# Patient Record
Sex: Female | Born: 1960 | Race: Black or African American | Hispanic: No | Marital: Single | Smoking: Never smoker
Health system: Southern US, Community
[De-identification: ages and names within clinical notes are randomized; demographics above are authoritative.]

## PROBLEM LIST (undated history)

## (undated) DIAGNOSIS — I10 Essential (primary) hypertension: Secondary | ICD-10-CM

## (undated) DIAGNOSIS — I2699 Other pulmonary embolism without acute cor pulmonale: Secondary | ICD-10-CM

## (undated) DIAGNOSIS — Z923 Personal history of irradiation: Secondary | ICD-10-CM

## (undated) DIAGNOSIS — C539 Malignant neoplasm of cervix uteri, unspecified: Secondary | ICD-10-CM

## (undated) DIAGNOSIS — I82409 Acute embolism and thrombosis of unspecified deep veins of unspecified lower extremity: Secondary | ICD-10-CM

## (undated) DIAGNOSIS — H548 Legal blindness, as defined in USA: Secondary | ICD-10-CM

## (undated) DIAGNOSIS — R569 Unspecified convulsions: Secondary | ICD-10-CM

## (undated) HISTORY — DX: Acute embolism and thrombosis of unspecified deep veins of unspecified lower extremity: I82.409

## (undated) HISTORY — DX: Other pulmonary embolism without acute cor pulmonale: I26.99

## (undated) HISTORY — DX: Personal history of irradiation: Z92.3

## (undated) HISTORY — PX: OTHER SURGICAL HISTORY: SHX169

## (undated) HISTORY — DX: Malignant neoplasm of cervix uteri, unspecified: C53.9

## (undated) HISTORY — DX: Legal blindness, as defined in USA: H54.8

---

## 2005-07-02 ENCOUNTER — Other Ambulatory Visit: Admission: RE | Admit: 2005-07-02 | Discharge: 2005-07-02 | Payer: Self-pay | Admitting: Family Medicine

## 2005-07-02 ENCOUNTER — Other Ambulatory Visit: Admission: RE | Admit: 2005-07-02 | Discharge: 2005-07-02 | Payer: Self-pay | Admitting: Unknown Physician Specialty

## 2018-08-07 ENCOUNTER — Other Ambulatory Visit: Payer: Self-pay | Admitting: Physician Assistant

## 2018-08-07 ENCOUNTER — Other Ambulatory Visit (HOSPITAL_COMMUNITY): Payer: Self-pay | Admitting: Physician Assistant

## 2018-08-07 DIAGNOSIS — R600 Localized edema: Secondary | ICD-10-CM

## 2018-08-11 ENCOUNTER — Other Ambulatory Visit: Payer: Self-pay

## 2018-08-11 ENCOUNTER — Ambulatory Visit (HOSPITAL_COMMUNITY)
Admission: RE | Admit: 2018-08-11 | Discharge: 2018-08-11 | Disposition: A | Discharge status: Home / Self Care | Payer: Commercial Managed Care - PPO | Source: Ambulatory Visit | Attending: Physician Assistant | Admitting: Physician Assistant

## 2018-08-11 ENCOUNTER — Emergency Department (HOSPITAL_COMMUNITY): Payer: Commercial Managed Care - PPO

## 2018-08-11 ENCOUNTER — Encounter (HOSPITAL_COMMUNITY): Payer: Self-pay

## 2018-08-11 ENCOUNTER — Observation Stay (HOSPITAL_COMMUNITY)
Admission: EM | Admit: 2018-08-11 | Discharge: 2018-08-12 | Disposition: A | Discharge status: Home / Self Care | Payer: Commercial Managed Care - PPO | Attending: Internal Medicine | Admitting: Internal Medicine

## 2018-08-11 DIAGNOSIS — I82401 Acute embolism and thrombosis of unspecified deep veins of right lower extremity: Secondary | ICD-10-CM | POA: Diagnosis present

## 2018-08-11 DIAGNOSIS — R600 Localized edema: Secondary | ICD-10-CM

## 2018-08-11 DIAGNOSIS — I2699 Other pulmonary embolism without acute cor pulmonale: Secondary | ICD-10-CM | POA: Insufficient documentation

## 2018-08-11 DIAGNOSIS — I824Y1 Acute embolism and thrombosis of unspecified deep veins of right proximal lower extremity: Secondary | ICD-10-CM | POA: Diagnosis not present

## 2018-08-11 DIAGNOSIS — I10 Essential (primary) hypertension: Secondary | ICD-10-CM | POA: Diagnosis not present

## 2018-08-11 DIAGNOSIS — E876 Hypokalemia: Secondary | ICD-10-CM | POA: Diagnosis present

## 2018-08-11 DIAGNOSIS — R2241 Localized swelling, mass and lump, right lower limb: Secondary | ICD-10-CM | POA: Diagnosis present

## 2018-08-11 DIAGNOSIS — Z79899 Other long term (current) drug therapy: Secondary | ICD-10-CM | POA: Diagnosis not present

## 2018-08-11 DIAGNOSIS — I824Z1 Acute embolism and thrombosis of unspecified deep veins of right distal lower extremity: Secondary | ICD-10-CM

## 2018-08-11 DIAGNOSIS — Z86711 Personal history of pulmonary embolism: Secondary | ICD-10-CM | POA: Diagnosis present

## 2018-08-11 DIAGNOSIS — I4891 Unspecified atrial fibrillation: Secondary | ICD-10-CM | POA: Diagnosis present

## 2018-08-11 DIAGNOSIS — G40909 Epilepsy, unspecified, not intractable, without status epilepticus: Secondary | ICD-10-CM

## 2018-08-11 DIAGNOSIS — I480 Paroxysmal atrial fibrillation: Secondary | ICD-10-CM | POA: Diagnosis present

## 2018-08-11 HISTORY — DX: Unspecified convulsions: R56.9

## 2018-08-11 HISTORY — DX: Essential (primary) hypertension: I10

## 2018-08-11 LAB — CBC WITH DIFFERENTIAL/PLATELET
Abs Immature Granulocytes: 0.03 10*3/uL (ref 0.00–0.07)
Basophils Absolute: 0.1 10*3/uL (ref 0.0–0.1)
Basophils Relative: 1 %
Eosinophils Absolute: 1.2 10*3/uL — ABNORMAL HIGH (ref 0.0–0.5)
Eosinophils Relative: 12 %
HCT: 33.2 % — ABNORMAL LOW (ref 36.0–46.0)
Hemoglobin: 10.5 g/dL — ABNORMAL LOW (ref 12.0–15.0)
Immature Granulocytes: 0 %
Lymphocytes Relative: 26 %
Lymphs Abs: 2.6 10*3/uL (ref 0.7–4.0)
MCH: 30.4 pg (ref 26.0–34.0)
MCHC: 31.6 g/dL (ref 30.0–36.0)
MCV: 96.2 fL (ref 80.0–100.0)
Monocytes Absolute: 0.7 10*3/uL (ref 0.1–1.0)
Monocytes Relative: 7 %
Neutro Abs: 5.5 10*3/uL (ref 1.7–7.7)
Neutrophils Relative %: 54 %
Platelets: 340 10*3/uL (ref 150–400)
RBC: 3.45 MIL/uL — ABNORMAL LOW (ref 3.87–5.11)
RDW: 14.1 % (ref 11.5–15.5)
WBC: 10.2 10*3/uL (ref 4.0–10.5)
nRBC: 0 % (ref 0.0–0.2)

## 2018-08-11 LAB — COMPREHENSIVE METABOLIC PANEL
ALT: 14 U/L (ref 0–44)
AST: 19 U/L (ref 15–41)
Albumin: 3.6 g/dL (ref 3.5–5.0)
Alkaline Phosphatase: 98 U/L (ref 38–126)
Anion gap: 10 (ref 5–15)
BUN: 15 mg/dL (ref 6–20)
CO2: 23 mmol/L (ref 22–32)
Calcium: 9.1 mg/dL (ref 8.9–10.3)
Chloride: 105 mmol/L (ref 98–111)
Creatinine, Ser: 0.97 mg/dL (ref 0.44–1.00)
GFR calc Af Amer: >60 mL/min (ref >60)
GFR calc non Af Amer: >60 mL/min (ref >60)
Glucose, Bld: 109 mg/dL — ABNORMAL HIGH (ref 70–99)
Potassium: 3.4 mmol/L — ABNORMAL LOW (ref 3.5–5.1)
Sodium: 138 mmol/L (ref 135–145)
Total Bilirubin: 0.5 mg/dL (ref 0.3–1.2)
Total Protein: 8.1 g/dL (ref 6.5–8.1)

## 2018-08-11 LAB — HEPARIN LEVEL (UNFRACTIONATED): Heparin Unfractionated: 0.53 IU/mL (ref 0.30–0.70)

## 2018-08-11 LAB — MAGNESIUM: Magnesium: 1.9 mg/dL (ref 1.7–2.4)

## 2018-08-11 LAB — PROTIME-INR
INR: 1 (ref 0.8–1.2)
Prothrombin Time: 13.4 seconds (ref 11.4–15.2)

## 2018-08-11 LAB — APTT: aPTT: 29 seconds (ref 24–36)

## 2018-08-11 MED ORDER — IOHEXOL 350 MG/ML SOLN
100.0000 mL | Freq: Once | INTRAVENOUS | Status: AC | PRN
  Administered 2018-08-11: 100 mL via INTRAVENOUS

## 2018-08-11 MED ORDER — ACETAMINOPHEN 650 MG RE SUPP: 650.0000 mg | Freq: Four times a day (QID) | RECTAL | Status: DC | PRN

## 2018-08-11 MED ORDER — ACETAMINOPHEN 325 MG PO TABS: 650.0000 mg | ORAL_TABLET | Freq: Four times a day (QID) | ORAL | Status: DC | PRN

## 2018-08-11 MED ORDER — HEPARIN SODIUM (PORCINE) 5000 UNIT/ML IJ SOLN
4000.0000 [IU] | Freq: Once | INTRAMUSCULAR | Status: AC
  Administered 2018-08-11: 4000 [IU] via INTRAVENOUS

## 2018-08-11 MED ORDER — POTASSIUM CHLORIDE CRYS ER 20 MEQ PO TBCR
40.0000 meq | EXTENDED_RELEASE_TABLET | Freq: Once | ORAL | Status: AC
  Administered 2018-08-11: 40 meq via ORAL
  Filled 2018-08-11: qty 2

## 2018-08-11 MED ORDER — HEPARIN (PORCINE) 25000 UT/250ML-% IV SOLN
1350.0000 [IU]/h | INTRAVENOUS | Status: DC
  Administered 2018-08-11 – 2018-08-12 (×2): 1350 [IU]/h via INTRAVENOUS
  Filled 2018-08-11 (×2): qty 250

## 2018-08-11 MED ORDER — PHENOBARBITAL 97.2 MG PO TABS
97.2000 mg | ORAL_TABLET | Freq: Two times a day (BID) | ORAL | Status: DC
  Administered 2018-08-11 – 2018-08-12 (×2): 97.2 mg via ORAL
  Filled 2018-08-11 (×3): qty 1

## 2018-08-11 MED ORDER — ATENOLOL 25 MG PO TABS
50.0000 mg | ORAL_TABLET | Freq: Every day | ORAL | Status: DC
  Administered 2018-08-11: 50 mg via ORAL
  Filled 2018-08-11 (×2): qty 2

## 2018-08-11 MED ORDER — CARBAMAZEPINE 200 MG PO TABS
400.0000 mg | ORAL_TABLET | Freq: Two times a day (BID) | ORAL | Status: DC
  Administered 2018-08-11 – 2018-08-12 (×2): 400 mg via ORAL
  Filled 2018-08-11 (×3): qty 2

## 2018-08-11 MED ORDER — ATENOLOL 25 MG PO TABS: 50.0000 mg | ORAL_TABLET | Freq: Every day | ORAL | Status: DC

## 2018-08-11 NOTE — ED Triage Notes (Addendum)
Pt sent over from [place] due to abnormal result. Pt has swelling to right leg which has been present for a year. Denies pain. Pt reports SOB with exertion for 6 months

## 2018-08-11 NOTE — Progress Notes (Signed)
RN paged Debbie Blinks, NP to make her aware that patient's BP was 158/106 and patient is due to start taking Atenolol 50 mg in a.m., but patient states she takes Atenolol at night.  NP ordered Atenolol to begin this evening.  RN will continue to monitor patient and make NP aware of any changes as needed.  [name], RN
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
## 2018-08-11 NOTE — ED Notes (Signed)
Have notified Dr. Velia Meyer of patient's EKG.

## 2018-08-11 NOTE — Progress Notes (Signed)
ANTICOAGULATION CONSULT NOTE - Initial Consult  Pharmacy Consult for heparin gtt  Indication: pulmonary embolus  No Known Allergies  Patient Measurements: Height: 5\' 2"  (157.5 cm) Weight: 295 lb (133.8 kg) IBW/kg (Calculated) : 50.1 Heparin Dosing Weight: HEPARIN DW (KG): 84   Vital Signs: Temp: 97.6 F (36.4 C) (04/28 1151) Temp Source: Oral (04/28 1151) BP: 169/87 (04/28 1400) Pulse Rate: 80 (04/28 1400)  Labs: Recent Labs    08/11/18 1158  HGB 10.5*  HCT 33.2*  PLT 340  APTT 29  LABPROT 13.4  INR 1.0  CREATININE 0.97    Estimated Creatinine Clearance: 82.4 mL/min (by C-G formula based on SCr of 0.97 mg/dL).   Medical History: Past Medical History:  Diagnosis Date  . Hypertension   . Seizures ([place])     Medications:  (Not in a hospital admission)  Scheduled:  . heparin  4,000 Units Intravenous Once   Infusions:  . heparin     PRN:  Anti-infectives (From admission, onward)   None      Assessment: Debbie [name] a 58 y.o. female requires anticoagulation with a heparin iv infusion for the indication of  pulmonary embolus. Heparin gtt will be started following pharmacy protocol per pharmacy consult. Patient is not on previous oral anticoagulant that will require aPTT/HL correlation before transitioning to only HL monitoring.   Goal of Therapy:  Heparin level 0.3-0.7 units/ml Monitor platelets by anticoagulation protocol: Yes   Plan:  Give 4000 units bolus x 1 Start heparin infusion at 1350 units/hr Check anti-Xa level in 6 hours and daily while on heparin Continue to monitor H&H and platelets  Heparin level to be drawn in 6 hours  Debbie [name] 08/11/2018,3:44 PM

## 2018-08-11 NOTE — ED Notes (Signed)
Have notified daughter of patient's status with patient's permission

## 2018-08-11 NOTE — ED Notes (Addendum)
CT stated they would pick pt up in 30 mins

## 2018-08-11 NOTE — ED Notes (Signed)
[place]- Daughter

## 2018-08-11 NOTE — ED Provider Notes (Addendum)
[facility] EMERGENCY DEPARTMENT Provider Note   CSN: [id number] Arrival date & time: 08/11/18  1138    History   Chief Complaint Chief Complaint  Patient presents with   Leg Swelling    HPI Debbie [name] is a 58 y.o. female.     Right lower extremity swelling for 1 year, worse over the past few weeks.  Patient reports dyspnea on exertion for 6 months.  No substernal chest.  Past medical history includes hypertension, seizures, obesity.  She is taking her medication.  No prolonged travel or immobilization.  An outpatient ultrasound of her right lower extremity was ordered today.  It is positive for a DVT.     Past Medical History:  Diagnosis Date   Hypertension    Seizures ([place])     There are no active problems to display for this patient.   History reviewed. No pertinent surgical history.   OB History   No obstetric history on file.      Home Medications    Prior to Admission medications   Medication Sig Start Date End Date Taking? Authorizing Provider  atenolol (TENORMIN) 50 MG tablet Take 50 mg by mouth daily.   Yes [provider]  carbamazepine (TEGRETOL) 200 MG tablet Take 400 mg by mouth 2 (two) times daily.   Yes [provider]  PHENobarbital (LUMINAL) 97.2 MG tablet Take 97.2 mg by mouth 2 (two) times daily.   Yes [provider]    Family History No family history on file.  Social History Social History   Tobacco Use   Smoking status: Never Smoker  Substance Use Topics   Alcohol use: Never    Frequency: Never   Drug use: Never     Allergies   Patient has no known allergies.   Review of Systems Review of Systems  All other systems reviewed and are negative.    Physical Exam Updated Vital Signs BP (!) 169/87    Pulse 80    Temp 97.6 F (36.4 C) (Oral)    Resp 15    Ht 5\' 2"  (1.575 m)    Wt 133.8 kg    SpO2 96%    BMI 53.96 kg/m   Physical Exam Vitals signs and nursing note reviewed.    Constitutional:      Appearance: She is well-developed.  HENT:     Head: Normocephalic and atraumatic.  Eyes:     Conjunctiva/sclera: Conjunctivae normal.  Neck:     Musculoskeletal: Neck supple.  Cardiovascular:     Rate and Rhythm: Normal rate and regular rhythm.  Pulmonary:     Effort: Pulmonary effort is normal.     Breath sounds: Normal breath sounds.  Abdominal:     General: Bowel sounds are normal.     Palpations: Abdomen is soft.  Musculoskeletal:     Comments: Bilateral lower extremities: Significant soft tissue swelling bilaterally right greater than left  Skin:    General: Skin is warm and dry.  Neurological:     Mental Status: She is alert and oriented to person, place, and time.  Psychiatric:        Behavior: Behavior normal.      ED Treatments / Results  Labs (all labs ordered are listed, but only abnormal results are displayed) Labs Reviewed  CBC WITH DIFFERENTIAL/PLATELET - Abnormal; Notable for the following components:      Result Value   RBC 3.45 (*)    Hemoglobin 10.5 (*)    HCT  33.2 (*)    Eosinophils Absolute 1.2 (*)    All other components within normal limits  COMPREHENSIVE METABOLIC PANEL - Abnormal; Notable for the following components:   Potassium 3.4 (*)    Glucose, Bld 109 (*)    All other components within normal limits  APTT  PROTIME-INR    EKG None  Radiology Ct Angio Chest Pe W And/or Wo Contrast  Result Date: 08/11/2018 CLINICAL DATA:  Shortness of breath EXAM: CT ANGIOGRAPHY CHEST WITH CONTRAST TECHNIQUE: Multidetector CT imaging of the chest was performed using the standard protocol during bolus administration of intravenous contrast. Multiplanar CT image reconstructions and MIPs were obtained to evaluate the vascular anatomy. CONTRAST:  130mL OMNIPAQUE IOHEXOL 350 MG/ML SOLN COMPARISON:  Chest radiograph May 09, 2018 FINDINGS: Cardiovascular: There is extensive pulmonary embolus arising from the right main pulmonary  artery extending into the proximal lower lobe pulmonary arteries. More distally, there is no significant embolus on the right. There is no appreciable pulmonary embolus on the left. The right ventricle to left ventricle diameter ratio is 0.9, borderline for right heart strain. There is no thoracic aortic aneurysm or dissection. Visualized great vessels appear unremarkable. There is aortic atherosclerosis. There is no pericardial effusion or pericardial thickening. Mediastinum/Nodes: Thyroid appears unremarkable. There are several subcentimeter lymph nodes in the mediastinum. There is an aortopulmonary window lymph node on the left with a maximum transverse diameter of 1.3 x 1.0 cm. There is a subcarinal lymph node measuring 1.1 x 1.1 cm. No esophageal lesions are evident. Lungs/Pleura: There is a moderate right pleural effusion with both free-flowing and loculated components. There is consolidation in a portion of the right lower lobe. There is atelectatic change in each lung base region. Upper Abdomen: Gallbladder is incompletely visualized but appears somewhat distended. Visualized upper abdominal structures otherwise appear normal except for aortic atherosclerosis in the upper abdominal region. Musculoskeletal: There is degenerative change in the lower thoracic spine. There are no blastic or lytic bone lesions. There are no demonstrable chest wall lesions. Review of the MIP images confirms the above findings. IMPRESSION: 1. Pulmonary embolus arising from the right main pulmonary outflow tract and extending to the proximal right lower lobe pulmonary arteries. No pulmonary embolus seen on the left. Borderline right heart strain. Positive for acute PE with CT evidence of borderline right heart strain (RV/LV Ratio = 0.9) consistent with at least submassive (intermediate risk) PE. The presence of right heart strain has been associated with an increased risk of morbidity and mortality. Please activate Code PE by paging  [phone]. 2. Sizable right pleural effusion with both loculated and free flowing components. Right lower lobe consolidation. Areas of atelectatic change bilaterally. 3. Mildly prominent aortopulmonary window and subcarinal lymph nodes of uncertain etiology. 4. Gallbladder appears mildly distended but incompletely visualized. 5. No thoracic aortic aneurysm or dissection. There is aortic atherosclerosis. Aortic Atherosclerosis (ICD10-I70.0). Critical Value/emergent results were called by telephone at the time of interpretation on 08/11/2018 at 3:11 pm to Dr. Nat Christen , who verbally acknowledged these results. Electronically Signed   By: Lowella Grip III M.D.   On: 08/11/2018 15:12   US Venous Img Lower Unilateral Right  Result Date: 08/11/2018 CLINICAL DATA:  58 year old female with right lower extremity edema EXAM: RIGHT LOWER EXTREMITY VENOUS DOPPLER ULTRASOUND TECHNIQUE: Gray-scale sonography with graded compression, as well as color Doppler and duplex ultrasound were performed to evaluate the lower extremity deep venous systems from the level of the common femoral vein and including the  common femoral, femoral, profunda femoral, popliteal and calf veins including the posterior tibial, peroneal and gastrocnemius veins when visible. The superficial great saphenous vein was also interrogated. Spectral Doppler was utilized to evaluate flow at rest and with distal augmentation maneuvers in the common femoral, femoral and popliteal veins. COMPARISON:  None. FINDINGS: Contralateral Common Femoral Vein: Respiratory phasicity is normal and symmetric with the symptomatic side. No evidence of thrombus. Normal compressibility. Common Femoral Vein: Acute partially occlusive thrombus extending above the common femoral artery into the pelvis. Saphenofemoral Junction: No evidence of thrombus. Normal compressibility and flow on color Doppler imaging. Profunda Femoral Vein: Acute occlusive thrombus A femoral and  popliteal vein: Acute occlusive thrombus through the length of the right femoral vein through the popliteal vein and into the tibial veins. Calf Veins: Anterior tibial vein remains patent. Partially occlusive thrombus of peroneal vein and posterior tibial vein. Superficial Great Saphenous Vein: No evidence of thrombus. Normal compressibility and flow on color Doppler imaging. Other Findings:  None. IMPRESSION: Acute right-sided DVT, including proximal DVT of the common femoral vein extending into the pelvis. Distally thrombus extends through the popliteal vein into the tibial veins. These results were discussed by telephone at the time of interpretation on 08/11/2018 at 11:50 am to Dr. Lacinda Axon, caring for the patient in the ED. Electronically Signed   By: Corrie Mckusick D.O.   On: 08/11/2018 11:53    Procedures Procedures (including critical care time)  Medications Ordered in ED Medications  heparin injection 4,000 Units (has no administration in time range)  iohexol (OMNIPAQUE) 350 MG/ML injection 100 mL (100 mLs Intravenous Contrast Given 08/11/18 1431)     Initial Impression / Assessment and Plan / ED Course  I have reviewed the triage vital signs and the nursing notes.  Pertinent labs & imaging results that were available during my care of the patient were reviewed by me and considered in my medical decision making (see chart for details).        DVT noted in right lower extremity as per ultrasound report.  Will obtain CT angiogram of chest to rule out pulmonary embolism.   1500:  CT chest angiogram shows right sided PE.  Will Rx IV Heparin.  Admit to gen med.  CRITICAL CARE Performed by: Nat Christen Total critical care time: 30 minutes Critical care time was exclusive of separately billable procedures and treating other patients. Critical care was necessary to treat or prevent imminent or life-threatening deterioration. Critical care was time spent personally by me on the following  activities: development of treatment plan with patient and/or surrogate as well as nursing, discussions with consultants, evaluation of patient's response to treatment, examination of patient, obtaining history from patient or surrogate, ordering and performing treatments and interventions, ordering and review of laboratory studies, ordering and review of radiographic studies, pulse oximetry and re-evaluation of patient's condition.  Final Clinical Impressions(s) / ED Diagnoses   Final diagnoses:  Acute deep vein thrombosis (DVT) of proximal vein of right lower extremity (HCC)  Acute pulmonary embolism, unspecified pulmonary embolism type, unspecified whether acute cor pulmonale present [facility])    ED Discharge Orders    None       Nat Christen, MD 08/11/18 1434    Nat Christen, MD 08/11/18 1530

## 2018-08-12 ENCOUNTER — Encounter (HOSPITAL_COMMUNITY): Payer: Self-pay | Admitting: General Practice

## 2018-08-12 ENCOUNTER — Observation Stay (HOSPITAL_BASED_OUTPATIENT_CLINIC_OR_DEPARTMENT_OTHER): Payer: Commercial Managed Care - PPO

## 2018-08-12 DIAGNOSIS — I824Z1 Acute embolism and thrombosis of unspecified deep veins of right distal lower extremity: Secondary | ICD-10-CM | POA: Diagnosis not present

## 2018-08-12 DIAGNOSIS — I2699 Other pulmonary embolism without acute cor pulmonale: Secondary | ICD-10-CM

## 2018-08-12 DIAGNOSIS — I10 Essential (primary) hypertension: Secondary | ICD-10-CM | POA: Diagnosis not present

## 2018-08-12 DIAGNOSIS — E876 Hypokalemia: Secondary | ICD-10-CM | POA: Diagnosis not present

## 2018-08-12 LAB — BASIC METABOLIC PANEL
Anion gap: 9 (ref 5–15)
BUN: 13 mg/dL (ref 6–20)
CO2: 26 mmol/L (ref 22–32)
Calcium: 9 mg/dL (ref 8.9–10.3)
Chloride: 105 mmol/L (ref 98–111)
Creatinine, Ser: 0.85 mg/dL (ref 0.44–1.00)
GFR calc Af Amer: >60 mL/min (ref >60)
GFR calc non Af Amer: >60 mL/min (ref >60)
Glucose, Bld: 102 mg/dL — ABNORMAL HIGH (ref 70–99)
Potassium: 3.6 mmol/L (ref 3.5–5.1)
Sodium: 140 mmol/L (ref 135–145)

## 2018-08-12 LAB — CBC
HCT: 29.7 % — ABNORMAL LOW (ref 36.0–46.0)
Hemoglobin: 9.5 g/dL — ABNORMAL LOW (ref 12.0–15.0)
MCH: 30.6 pg (ref 26.0–34.0)
MCHC: 32 g/dL (ref 30.0–36.0)
MCV: 95.8 fL (ref 80.0–100.0)
Platelets: 305 10*3/uL (ref 150–400)
RBC: 3.1 MIL/uL — ABNORMAL LOW (ref 3.87–5.11)
RDW: 13.9 % (ref 11.5–15.5)
WBC: 9.1 10*3/uL (ref 4.0–10.5)
nRBC: 0 % (ref 0.0–0.2)

## 2018-08-12 LAB — ECHOCARDIOGRAM COMPLETE
Height: 62 in
Weight: 4733.72 oz

## 2018-08-12 LAB — MAGNESIUM: Magnesium: 1.7 mg/dL (ref 1.7–2.4)

## 2018-08-12 LAB — HEPARIN LEVEL (UNFRACTIONATED): Heparin Unfractionated: 0.32 IU/mL (ref 0.30–0.70)

## 2018-08-12 MED ORDER — ENOXAPARIN SODIUM 150 MG/ML ~~LOC~~ SOLN: 130.0000 mg | Freq: Two times a day (BID) | SUBCUTANEOUS | 0 refills | Status: DC

## 2018-08-12 MED ORDER — ENOXAPARIN SODIUM 150 MG/ML ~~LOC~~ SOLN
130.0000 mg | Freq: Two times a day (BID) | SUBCUTANEOUS | Status: DC
  Administered 2018-08-12: 130 mg via SUBCUTANEOUS
  Filled 2018-08-12: qty 1

## 2018-08-12 MED ORDER — PERFLUTREN LIPID MICROSPHERE
1.0000 mL | INTRAVENOUS | Status: DC | PRN
  Filled 2018-08-12: qty 10

## 2018-08-12 MED ORDER — APIXABAN 5 MG PO TABS: ORAL_TABLET | ORAL | 5 refills | Status: DC

## 2018-08-12 MED ORDER — APIXABAN 5 MG PO TABS: 5.0000 mg | ORAL_TABLET | Freq: Two times a day (BID) | ORAL | Status: DC

## 2018-08-12 NOTE — Progress Notes (Signed)
ANTICOAGULATION CONSULT NOTE - Initial Consult  Pharmacy Consult for heparin gtt  Indication: pulmonary embolus  No Known Allergies  Patient Measurements: Height: 5\' 2"  (157.5 cm) Weight: 295 lb 13.7 oz (134.2 kg) IBW/kg (Calculated) : 50.1 Heparin Dosing Weight: HEPARIN DW (KG): 84   Vital Signs: Temp: 98.3 F (36.8 C) (04/29 0515) Temp Source: Oral (04/29 0515) BP: 138/81 (04/29 0515) Pulse Rate: 70 (04/29 0515)  Labs: Recent Labs    08/11/18 1158 08/11/18 2123 08/12/18 0552  HGB 10.5*  --  9.5*  HCT 33.2*  --  29.7*  PLT 340  --  305  APTT 29  --   --   LABPROT 13.4  --   --   INR 1.0  --   --   HEPARINUNFRC  --  0.53 0.32  CREATININE 0.97  --  0.85    Estimated Creatinine Clearance: 94.2 mL/min (by C-G formula based on SCr of 0.85 mg/dL).   Medical History: Past Medical History:  Diagnosis Date  . Hypertension   . Seizures ([place])     Medications:  Medications Prior to Admission  Medication Sig Dispense Refill Last Dose  . atenolol (TENORMIN) 50 MG tablet Take 50 mg by mouth daily.   08/10/2018 at Unknown time  . carbamazepine (TEGRETOL) 200 MG tablet Take 400 mg by mouth 2 (two) times daily.   08/10/2018 at Unknown time  . PHENobarbital (LUMINAL) 97.2 MG tablet Take 97.2 mg by mouth 2 (two) times daily.   08/10/2018 at Unknown time   Scheduled:  . atenolol  50 mg Oral Daily  . carbamazepine  400 mg Oral BID  . PHENobarbital  97.2 mg Oral BID   Infusions:  . heparin 1,350 Units/hr (08/12/18 0523)   PRN:  Anti-infectives (From admission, onward)   None      Assessment: Debbie [name] a 58 y.o. female requires anticoagulation with a heparin iv infusion for the indication of  pulmonary embolus. Heparin gtt will be started following pharmacy protocol per pharmacy consult.  HL 0.32  Goal of Therapy:  Heparin level 0.3-0.7 units/ml Monitor platelets by anticoagulation protocol: Yes   Plan:  Continue heparin infusion at 1350 units/hr Check  anti-Xa level daily while on heparin Continue to monitor H&H and platelets   Debbie [name] 08/12/2018,7:53 AM

## 2018-08-12 NOTE — Progress Notes (Signed)
*  PRELIMINARY RESULTS* Echocardiogram 2D Echocardiogram has been performed.  Samuel Germany 08/12/2018, 9:44 AM

## 2018-08-12 NOTE — Discharge Instructions (Signed)
YOUR CARDIOLOGY TEAM HAS ARRANGED FOR AN E-VISIT FOR YOUR APPOINTMENT - PLEASE REVIEW IMPORTANT INFORMATION BELOW SEVERAL DAYS PRIOR TO YOUR APPOINTMENT ° °Due to the recent COVID-19 pandemic, we are transitioning in-person office visits to tele-medicine visits in an effort to decrease unnecessary exposure to our patients, their families, and staff. These visits are billed to your insurance just like a normal visit is. We also encourage you to sign up for MyChart if you have not already done so. You will need a smartphone if possible. For patients that do not have this, we can still complete the visit using a regular telephone but do prefer a smartphone to enable video when possible. You may have a family member that lives with you that can help. If possible, we also ask that you have a blood pressure cuff and scale at home to measure your blood pressure, heart rate and weight prior to your scheduled appointment. Patients with clinical needs that need an in-person evaluation and testing will still be able to come to the office if absolutely necessary. If you have any questions, feel free to call our office. ° ° ° °2-3 DAYS BEFORE YOUR APPOINTMENT ° °You will receive a telephone call from one of our [facility] team members - your caller ID may say "Unknown caller." If this is a video visit, we will walk you through how to get the video launched on your phone. We will remind you check your blood pressure, heart rate and weight prior to your scheduled appointment. If you have an Apple Watch or Kardia, please upload any pertinent ECG strips the day before or morning of your appointment to MyChart. Our staff will also make sure you have reviewed the consent and agree to move forward with your scheduled tele-health visit.  ° ° ° °THE DAY OF YOUR APPOINTMENT ° °Approximately 15 minutes prior to your scheduled appointment, you will receive a telephone call from one of [facility] team - your caller ID may say "Unknown caller."   Our staff will confirm medications, vital signs for the day and any symptoms you may be experiencing. Please have this information available prior to the time of visit start. It may also be helpful for you to have a pad of paper and pen handy for any instructions given during your visit. They will also walk you through joining the smartphone meeting if this is a video visit. ° ° ° °CONSENT FOR TELE-HEALTH VISIT - PLEASE REVIEW ° °I hereby voluntarily request, consent and authorize [facility] and its employed or contracted physicians, physician assistants, nurse practitioners or other licensed health care professionals (the Practitioner), to provide me with telemedicine health care services (the “Services") as deemed necessary by the treating Practitioner. I acknowledge and consent to receive the Services by the Practitioner via telemedicine. I understand that the telemedicine visit will involve communicating with the Practitioner through live audiovisual communication technology and the disclosure of certain medical information by electronic transmission. I acknowledge that I have been given the opportunity to request an in-person assessment or other available alternative prior to the telemedicine visit and am voluntarily participating in the telemedicine visit. ° °I understand that I have the right to withhold or withdraw my consent to the use of telemedicine in the course of my care at any time, without affecting my right to future care or treatment, and that the Practitioner or I may terminate the telemedicine visit at any time. I understand that I have the right to inspect all information   obtained and/or recorded in the course of the telemedicine visit and may receive copies of available information for a reasonable fee.  I understand that some of the potential risks of receiving the Services via telemedicine include:  °• Delay or interruption in medical evaluation due to technological equipment failure or  disruption; °• Information transmitted may not be sufficient (e.g. poor resolution of images) to allow for appropriate medical decision making by the Practitioner; and/or  °• In rare instances, security protocols could fail, causing a breach of personal health information. ° °Furthermore, I acknowledge that it is my responsibility to provide information about my medical history, conditions and care that is complete and accurate to the best of my ability. I acknowledge that Practitioner's advice, recommendations, and/or decision may be based on factors not within their control, such as incomplete or inaccurate data provided by me or distortions of diagnostic images or specimens that may result from electronic transmissions. I understand that the practice of medicine is not an exact science and that Practitioner makes no warranties or guarantees regarding treatment outcomes. I acknowledge that I will receive a copy of this consent concurrently upon execution via email to the email address I last provided but may also request a printed copy by calling the office of [facility].   ° °I understand that my insurance will be billed for this visit.  ° °I have read or had this consent read to me. °• I understand the contents of this consent, which adequately explains the benefits and risks of the Services being provided via telemedicine.  °• I have been provided ample opportunity to ask questions regarding this consent and the Services and have had my questions answered to my satisfaction. °• I give my informed consent for the services to be provided through the use of telemedicine in my medical care ° °By participating in this telemedicine visit I agree to the above. ° °

## 2018-08-12 NOTE — Discharge Summary (Signed)
Physician Discharge Summary  Debbie [name] SNK:[id number] DOB: [birth year] DOA: 08/11/2018  PCP: Sandria Manly Semmes date: 9/37/9024 Discharge date: 08/12/2018  Time spent: 35 minutes  Recommendations for Outpatient Follow-up:  1. Repeat CBC to follow hemoglobin trend 2. Repeat CMET to follow electrolytes, renal function and liver function; patient newly started on full dose Lovenox as part of anticoagulation therapy.   Discharge Diagnoses:  Principal Problem:   Acute pulmonary embolism (HCC) Active Problems:   Acute deep vein thrombosis (DVT) of right lower extremity (HCC)   Hypokalemia   Unspecified atrial fibrillation (HCC)   HTN (hypertension)   Seizure disorder ([place])   Discharge Condition: Stable and improved.  Patient discharged home with instruction to follow-up with PCP in 10 days and to follow-up with cardiology service as an outpatient.  Diet recommendation: Heart healthy/low calorie diet.  Filed Weights   08/11/18 1148 08/11/18 1818 08/12/18 0515  Weight: 133.8 kg 134.1 kg 134.2 kg    History of present illness:  As per H&P written by Dr. Velia Meyer on 08/11/2018 58 y.o. female with medical history significant for hypertension, seizure disorder, morbid obesity, who was admitted to [facility] on 08/11/2018 with acute right-sided pulmonary embolism after presenting from home to the [facility] ED complaining of right lower extremity pain.   The patient reports that she has been experiencing symmetrical swelling in her bilateral lower extremities over the course of the last year.  However, over the last 1 to 2 weeks, she notes asymmetrical worsening of the swelling associated with her right lower extremity, which is been associated with generalized discomfort involving the right lower extremity as well as right calf tenderness.  Denies any associated numbness, paresthesias, or diminished temperature associated with the right lower extremity.  She also notes  shortness of breath that is limited to periods of exertion, which started approximately 6 months ago.  She denies any significant progression of her dyspnea on exertion over that time, and denies any associated orthopnea/PND.  Denies any associated chest pain over that time, including no pleuritic chest discomfort.  She reports a mild nonproductive cough over the last 5 to 6 months, but denies any associated hemoptysis.  Denies any palpitations, diaphoresis, dizziness, nausea, vomiting, presyncope, or syncope.   She denies any personal history of prior DVT or PE, and reports that she is not currently on any blood thinning agents.  Over the last year, the patient denies any travel in airplane or any prolonged car rides.  She denies any trauma or surgical procedures over the last year.  Additionally, she denies any periods of diminished ambulatory activity over that time.  Denies any family history of inheritable hypercoagulable states.  She reports that only 1 of her primary relatives has formally been diagnosed with either a DVT or PE.  Specifically, she reports that her sister was diagnosed with a clot, although she does not recall if it was a DVT or PE, in the weeks following an automobile accident.  Patient reports that her sisters clot was felt to be provoked in the setting of recent trauma, was treated with 6 months of anticoagulation, and has not subsequently experienced any recurrence of blood clots.  The patient denies any personal or family history of malignancy.  She reports that she is current on her mammograms and gynecological evaluations, but has never undergone screening colonoscopy.  She is a lifelong non-smoker, and reports that she is not on any hormonal therapy.  Denies any known history of  heart failure, and denies any history of gastrointestinal bleed.  In the setting of progression of her right lower extremity swelling over the last 1 to 2 weeks, the patient underwent outpatient venous  ultrasound of the right lower extremity, which, per final radiology report, showed acute right-sided DVT, including proximal DVT of the common femoral vein extending into the pelvis, with distal thrombus extension through the popliteal vein into the tibial veins.  In light of this result, the patient was instructed by her PCP to present to the emergency department for further evaluation.  ED Course: Vital signs in the emergency department were notable for the following: Temperature max 97.9; heart rate 77-89; blood pressure ranged from 154/109 to 169/87; respiratory rate 15-16, and oxygen saturation 95 to 98% on room air.  Labs performed in the ED were notable for the following: CMP notable for potassium 3.4, creatinine 0.97.  CBC notable for white blood cell count of 10,200 with 54% neutrophils, hemoglobin 10.5, with no prior hemoglobin data point available for point comparison, and platelets 340.  INR 1.0, and PTT 29.   Hospital Course:  1-acute right lower extremity DVT and right lung pulmonary embolism: -CTA of the chest demonstrated no evidence of pulmonary hemorrhagic or infarction. -Patient treated with IV heparin for 24 hours and discharged home on Lovenox as anticoagulation therapy. -Patient declined the use of Coumadin and given ongoing antiepileptic drugs (phenobarbital and carbamazepine) the use of novel anticoagulant agents was not recommended. -2D echo with preserved ejection fraction and no signs of right heart strain. -Patient denies chest pain, shortness of breath, nausea, vomiting or any other complaints at discharge. -Instructed to follow-up with PCP in 10 days -Will be followed by cardiology service as an outpatient (to further pursued evaluation for atrial fibrillation).  2-atrial fibrillation -New diagnosis -No structural abnormality appreciated on 2D echo -Patient discharged on Lovenox full dose for anticoagulation purposes -CHADsVASC score 2 -Continue atenolol for rate  control.  3-hypokalemia -Magnesium within normal limits -Electrolytes has been repleted -Repeat basic metabolic panel at follow-up visit.  4-essential hypertension -Blood pressure stable and well-controlled -Continue the use of atenolol -Heart healthy diet has been encouraged.  5-morbid obesity -Body mass index is 54.11 kg/m. -Low calorie diet has been discussed with patient. -Portion control and increase physical activity has been encouraged.  6-seizure disorder -No seizure activity appreciated -Continue the use of current antiepileptic drugs.  Procedures:  2D echo: Preserved ejection fraction, no wall motion abnormalities, unable to assess systolic function on right ventricle, but there was no dilation or abnormalities to suggested right heart strain.  Consultations:  Cardiology curbside (Dr. Domenic Polite; in agreement for the patient to be discharged on prior to admission beta-blocker and the use of anticoagulation; patient will be seen as an outpatient in order to star follow-up with cardiology service for new onset atrial fibrillation.)  Discharge Exam: Vitals:   08/12/18 0515 08/12/18 1356  BP: 138/81 133/63  Pulse: 70 74  Resp: 20 18  Temp: 98.3 F (36.8 C) 98 F (36.7 C)  SpO2: 100% 97%    General: Afebrile, denies chest pain or shortness of breath currently; no nausea, no vomiting.  No signs of overt bleeding Cardiovascular: S1 and S2, rate control; no murmurs, no gallops, no rubs.  Unable to assess JVD due to body habitus. Respiratory: Good air movement bilaterally, no crackles, no wheezing, normal respiratory effort. Abdomen: Obese, nontender, nondistended, positive bowel sounds Extremities: 3+ edema right lower extremity, 1-2+ edema left lower extremity; there is some  chronic lymphedema changes appreciated bilaterally.  No open wounds.  There was no tenderness on palpation.  Discharge Instructions   Discharge Instructions    Diet - low sodium heart healthy    Complete by:  As directed    Discharge instructions   Complete by:  As directed    Take medications as prescribed Maintain adequate hydration Follow heart healthy/low calorie diet Avoid the use of NSAIDs Follow-up with PCP in 10 days Follow-up with cardiology service as instructed (office will contact you with details).     Allergies as of 08/12/2018   No Known Allergies     Medication List    TAKE these medications   atenolol 50 MG tablet Commonly known as:  TENORMIN Take 50 mg by mouth daily.   carbamazepine 200 MG tablet Commonly known as:  TEGRETOL Take 400 mg by mouth 2 (two) times daily.   enoxaparin 150 MG/ML injection Commonly known as:  LOVENOX Inject 0.87 mLs (130 mg total) into the skin every 12 (twelve) hours.   PHENobarbital 97.2 MG tablet Commonly known as:  LUMINAL Take 97.2 mg by mouth 2 (two) times daily.      No Known Allergies Follow-up Information    Josue Hector, MD Follow up on 08/19/2018.   Specialty:  Cardiology Why:  Cardiology Telehealth Follow-up on 08/19/2018 at 1:00 PM. The office will be in contact with you prior to your appointment to further discuss.  Contact information: [address] [place] [address] [place], [facility]. Schedule an appointment as soon as possible for a visit in 10 day(s).   Contact information: [address] [place] [address] [phone]           The results of significant diagnostics from this hospitalization (including imaging, microbiology, ancillary and laboratory) are listed below for reference.    Significant Diagnostic Studies: Ct Angio Chest Pe W And/or Wo Contrast  Result Date: 08/11/2018 CLINICAL DATA:  Shortness of breath EXAM: CT ANGIOGRAPHY CHEST WITH CONTRAST TECHNIQUE: Multidetector CT imaging of the chest was performed using the standard protocol during bolus administration of intravenous contrast. Multiplanar CT image  reconstructions and MIPs were obtained to evaluate the vascular anatomy. CONTRAST:  15mL OMNIPAQUE IOHEXOL 350 MG/ML SOLN COMPARISON:  Chest radiograph May 09, 2018 FINDINGS: Cardiovascular: There is extensive pulmonary embolus arising from the right main pulmonary artery extending into the proximal lower lobe pulmonary arteries. More distally, there is no significant embolus on the right. There is no appreciable pulmonary embolus on the left. The right ventricle to left ventricle diameter ratio is 0.9, borderline for right heart strain. There is no thoracic aortic aneurysm or dissection. Visualized great vessels appear unremarkable. There is aortic atherosclerosis. There is no pericardial effusion or pericardial thickening. Mediastinum/Nodes: Thyroid appears unremarkable. There are several subcentimeter lymph nodes in the mediastinum. There is an aortopulmonary window lymph node on the left with a maximum transverse diameter of 1.3 x 1.0 cm. There is a subcarinal lymph node measuring 1.1 x 1.1 cm. No esophageal lesions are evident. Lungs/Pleura: There is a moderate right pleural effusion with both free-flowing and loculated components. There is consolidation in a portion of the right lower lobe. There is atelectatic change in each lung base region. Upper Abdomen: Gallbladder is incompletely visualized but appears somewhat distended. Visualized upper abdominal structures otherwise appear normal except for aortic atherosclerosis in the upper abdominal region. Musculoskeletal: There is degenerative change in the  lower thoracic spine. There are no blastic or lytic bone lesions. There are no demonstrable chest wall lesions. Review of the MIP images confirms the above findings. IMPRESSION: 1. Pulmonary embolus arising from the right main pulmonary outflow tract and extending to the proximal right lower lobe pulmonary arteries. No pulmonary embolus seen on the left. Borderline right heart strain. Positive for acute  PE with CT evidence of borderline right heart strain (RV/LV Ratio = 0.9) consistent with at least submassive (intermediate risk) PE. The presence of right heart strain has been associated with an increased risk of morbidity and mortality. Please activate Code PE by paging [phone]. 2. Sizable right pleural effusion with both loculated and free flowing components. Right lower lobe consolidation. Areas of atelectatic change bilaterally. 3. Mildly prominent aortopulmonary window and subcarinal lymph nodes of uncertain etiology. 4. Gallbladder appears mildly distended but incompletely visualized. 5. No thoracic aortic aneurysm or dissection. There is aortic atherosclerosis. Aortic Atherosclerosis (ICD10-I70.0). Critical Value/emergent results were called by telephone at the time of interpretation on 08/11/2018 at 3:11 pm to Dr. Nat Christen , who verbally acknowledged these results. Electronically Signed   By: Lowella Grip III M.D.   On: 08/11/2018 15:12   US Venous Img Lower Unilateral Right  Result Date: 08/11/2018 CLINICAL DATA:  58 year old female with right lower extremity edema EXAM: RIGHT LOWER EXTREMITY VENOUS DOPPLER ULTRASOUND TECHNIQUE: Gray-scale sonography with graded compression, as well as color Doppler and duplex ultrasound were performed to evaluate the lower extremity deep venous systems from the level of the common femoral vein and including the common femoral, femoral, profunda femoral, popliteal and calf veins including the posterior tibial, peroneal and gastrocnemius veins when visible. The superficial great saphenous vein was also interrogated. Spectral Doppler was utilized to evaluate flow at rest and with distal augmentation maneuvers in the common femoral, femoral and popliteal veins. COMPARISON:  None. FINDINGS: Contralateral Common Femoral Vein: Respiratory phasicity is normal and symmetric with the symptomatic side. No evidence of thrombus. Normal compressibility. Common Femoral  Vein: Acute partially occlusive thrombus extending above the common femoral artery into the pelvis. Saphenofemoral Junction: No evidence of thrombus. Normal compressibility and flow on color Doppler imaging. Profunda Femoral Vein: Acute occlusive thrombus A femoral and popliteal vein: Acute occlusive thrombus through the length of the right femoral vein through the popliteal vein and into the tibial veins. Calf Veins: Anterior tibial vein remains patent. Partially occlusive thrombus of peroneal vein and posterior tibial vein. Superficial Great Saphenous Vein: No evidence of thrombus. Normal compressibility and flow on color Doppler imaging. Other Findings:  None. IMPRESSION: Acute right-sided DVT, including proximal DVT of the common femoral vein extending into the pelvis. Distally thrombus extends through the popliteal vein into the tibial veins. These results were discussed by telephone at the time of interpretation on 08/11/2018 at 11:50 am to Dr. Lacinda Axon, caring for the patient in the ED. Electronically Signed   By: Corrie Mckusick D.O.   On: 08/11/2018 11:53    Microbiology: No results found for this or any previous visit (from the past 240 hour(s)).   Labs: Basic Metabolic Panel: Recent Labs  Lab 08/11/18 1158 08/12/18 0552  NA 138 140  K 3.4* 3.6  CL 105 105  CO2 23 26  GLUCOSE 109* 102*  BUN 15 13  CREATININE 0.97 0.85  CALCIUM 9.1 9.0  MG 1.9 1.7   Liver Function Tests: Recent Labs  Lab 08/11/18 1158  AST 19  ALT 14  ALKPHOS 98  BILITOT 0.5  PROT 8.1  ALBUMIN 3.6   CBC: Recent Labs  Lab 08/11/18 1158 08/12/18 0552  WBC 10.2 9.1  NEUTROABS 5.5  --   HGB 10.5* 9.5*  HCT 33.2* 29.7*  MCV 96.2 95.8  PLT 340 305    Signed:  Barton Dubois MD.  [facility] 08/12/2018, 4:26 PM

## 2018-08-12 NOTE — Progress Notes (Signed)
Discharge instructions reviewed with patient. Demonstrated to patient how to administer lovenox injection, patient verbalized and demonstrated understanding of administration. Patient home meds in pharmacy, pharmacy closed at this time. Patient instructed that home meds could be picked up in am. Patient discharged home with family in stable condition.

## 2018-08-13 LAB — HIV ANTIBODY (ROUTINE TESTING W REFLEX): HIV Screen 4th Generation wRfx: NONREACTIVE
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
## 2018-08-19 ENCOUNTER — Encounter: Payer: Self-pay | Admitting: Cardiovascular Disease

## 2018-08-19 ENCOUNTER — Telehealth (INDEPENDENT_AMBULATORY_CARE_PROVIDER_SITE_OTHER): Payer: Commercial Managed Care - PPO | Admitting: Cardiovascular Disease

## 2018-08-19 ENCOUNTER — Telehealth: Payer: Self-pay | Admitting: Cardiovascular Disease

## 2018-08-19 VITALS — BP 158/114 | HR 71 | Ht 62.0 in | Wt 295.0 lb

## 2018-08-19 DIAGNOSIS — I2699 Other pulmonary embolism without acute cor pulmonale: Secondary | ICD-10-CM | POA: Diagnosis not present

## 2018-08-19 DIAGNOSIS — Z7901 Long term (current) use of anticoagulants: Secondary | ICD-10-CM

## 2018-08-19 DIAGNOSIS — Z5181 Encounter for therapeutic drug level monitoring: Secondary | ICD-10-CM

## 2018-08-19 DIAGNOSIS — I480 Paroxysmal atrial fibrillation: Secondary | ICD-10-CM

## 2018-08-19 NOTE — Telephone Encounter (Signed)
Consent obtained    YOUR CARDIOLOGY TEAM HAS ARRANGED FOR AN E-VISIT FOR YOUR APPOINTMENT - PLEASE REVIEW IMPORTANT INFORMATION BELOW SEVERAL DAYS PRIOR TO YOUR APPOINTMENT  Due to the recent COVID-19 pandemic, we are transitioning in-person office visits to tele-medicine visits in an effort to decrease unnecessary exposure to our patients and staff. Medicare and most insurances are covering these visits without a copay needed. We also encourage you to sign up for MyChart if you have not already done so. You will need a smartphone if possible. For patients that do not have this, we can still complete the visit using a regular telephone but do prefer a smartphone to enable video when possible. You may have a close family member that lives with you that can help. If possible, we also ask that you have a blood pressure cuff and scale at home to measure your blood pressure, heart rate and weight prior to your scheduled appointment. Patients with clinical needs that need an in-person evaluation and testing will still be able to come to the office if absolutely necessary. If you have any questions, feel free to call our office.    IF YOU HAVE A SMARTPHONE, PLEASE DOWNLOAD THE WEBEX APP TO YOUR SMARTPHONE  - If Apple, go to [employer] and type in WebEx in the search bar. [place] [employer], the blue/green circle. The app is free but as with any other app download, your phone may require you to verify saved payment information or Apple password. You do NOT have to create a WebEx account.  - If Android, go to [employer] and type in [employer] in the search bar. [place] [employer], the blue/green circle. The app is free but as with any other app download, your phone may require you to verify saved payment information or Android password. You do NOT have to create a WebEx account.  It is very helpful to have this downloaded before your visit.    2-3 DAYS BEFORE YOUR APPOINTMENT  You  will receive a telephone call from one of our [place] team members - your caller ID may say "Unknown caller." If this is a video visit, we will confirm that you have been able to download the WebEx app. We will remind you check your blood pressure, heart rate and weight prior to your scheduled appointment. If you have an Apple Watch or Kardia, please upload any pertinent ECG strips the day before or morning of your appointment to [place]. Our staff will also make sure you have reviewed the consent and agree to move forward with your scheduled tele-health visit.     THE DAY OF YOUR APPOINTMENT  Approximately 15 minutes prior to your scheduled appointment, you will receive a telephone call from one of [facility] team - your caller ID may say "Unknown caller."  Our staff will confirm medications, vital signs for the day and any symptoms you may be experiencing. Please have this information available prior to the time of visit start. It may also be helpful for you to have a pad of paper and pen handy for any instructions given during your visit. They will also walk you through joining the WebEx smartphone meeting if this is a video visit.    CONSENT FOR TELE-HEALTH VISIT - PLEASE REVIEW  I hereby voluntarily request, consent and authorize [facility] and its employed or contracted physicians, physician assistants, nurse practitioners or other licensed health care professionals (the Practitioner), to provide me with telemedicine health  care services (the Services") as deemed necessary by the treating Practitioner. I acknowledge and consent to receive the Services by the Practitioner via telemedicine. I understand that the telemedicine visit will involve communicating with the Practitioner through live audiovisual communication technology and the disclosure of certain medical information by electronic transmission. I acknowledge that I have been given the opportunity to request an in-person assessment or  other available alternative prior to the telemedicine visit and am voluntarily participating in the telemedicine visit.  I understand that I have the right to withhold or withdraw my consent to the use of telemedicine in the course of my care at any time, without affecting my right to future care or treatment, and that the Practitioner or I may terminate the telemedicine visit at any time. I understand that I have the right to inspect all information obtained and/or recorded in the course of the telemedicine visit and may receive copies of available information for a reasonable fee.  I understand that some of the potential risks of receiving the Services via telemedicine include:   Delay or interruption in medical evaluation due to technological equipment failure or disruption;  Information transmitted may not be sufficient (e.g. poor resolution of images) to allow for appropriate medical decision making by the Practitioner; and/or   In rare instances, security protocols could fail, causing a breach of personal health information.  Furthermore, I acknowledge that it is my responsibility to provide information about my medical history, conditions and care that is complete and accurate to the best of my ability. I acknowledge that Practitioner's advice, recommendations, and/or decision may be based on factors not within their control, such as incomplete or inaccurate data provided by me or distortions of diagnostic images or specimens that may result from electronic transmissions. I understand that the practice of medicine is not an exact science and that Practitioner makes no warranties or guarantees regarding treatment outcomes. I acknowledge that I will receive a copy of this consent concurrently upon execution via email to the email address I last provided but may also request a printed copy by calling the office of [place].    I understand that my insurance will be billed for this visit.   I  have read or had this consent read to me.  I understand the contents of this consent, which adequately explains the benefits and risks of the Services being provided via telemedicine.   I have been provided ample opportunity to ask questions regarding this consent and the Services and have had my questions answered to my satisfaction.  I give my informed consent for the services to be provided through the use of telemedicine in my medical care  By participating in this telemedicine visit I agree to the above.

## 2018-08-19 NOTE — Progress Notes (Signed)
Medication Instructions:  STOP ELIQUIS  CONTINUE LOVENOX UNTIL YOU GET IN TO SEE DOONQUAH  Labwork: NONE  Testing/Procedures: DONE   Follow-Up: Your physician recommends that you schedule a follow-up appointment in: 2 WEEKS FOR VIRTUAL VISIT   Any Other Special Instructions Will Be Listed Below (If Applicable).     If you need a refill on your cardiac medications before your next appointment, please call your pharmacy.

## 2018-09-02 ENCOUNTER — Telehealth: Payer: Self-pay | Admitting: Pharmacist

## 2018-09-02 ENCOUNTER — Telehealth (INDEPENDENT_AMBULATORY_CARE_PROVIDER_SITE_OTHER): Payer: Commercial Managed Care - PPO | Admitting: Cardiology

## 2018-09-02 DIAGNOSIS — I2699 Other pulmonary embolism without acute cor pulmonale: Secondary | ICD-10-CM

## 2018-09-02 DIAGNOSIS — I824Z1 Acute embolism and thrombosis of unspecified deep veins of right distal lower extremity: Secondary | ICD-10-CM

## 2018-09-02 DIAGNOSIS — I10 Essential (primary) hypertension: Secondary | ICD-10-CM

## 2018-09-02 DIAGNOSIS — I4891 Unspecified atrial fibrillation: Secondary | ICD-10-CM

## 2018-09-02 DIAGNOSIS — G40909 Epilepsy, unspecified, not intractable, without status epilepticus: Secondary | ICD-10-CM

## 2018-09-02 NOTE — Telephone Encounter (Signed)
  Phillips Odor, MD  [place], Harlon Flor, [facility]        We have reached out to her but cannot get a hold of her. Please have he call the office [phone] and ask for Raquel Sarna   Previous Messages    ----- Message -----  From: Leeroy Bock, [facility]  Sent: 08/19/2018  3:13 PM EDT  To: Phillips Odor, MD   Hi Dr Merlene Laughter,   Please let me know if/when you are able to switch Ms. Will's antiepileptics so that I can help in any way in transitioning her from Lovenox to Eliquis.   Thanks!  Jinny Blossom, PharmD  ----- Message -----  From: Josue Hector, MD  Sent: 08/19/2018  2:56 PM EDT  To: Leeroy Bock, [place]   Unfortunately it was done by hospitalist that d/c the patient and I've been trying to sort it out and keep her from having a major complication. That's why I sent to you as well as neurologist to make sure she gets the correct f/u Thanks As of now she knows to stop eliquis and continue lovenox until neurology changes AED       From: Josue Hector, MD  Sent: 08/19/2018  1:47 PM EDT  To: Phillips Odor, MD, Malen Gauze, RN, *   Mrs [facility] has been taking both Rx lovenox bid and Eliquis bid for a week !!!  I told her to stop eliquis and keep taking lovenox until Dr Merlene Laughter can get her off Tegretol and Phenobarbitol and on ? Keppra. Then she can take eliquis and stop lovenox. She needs life long anticoagulation for massive recent PE and afib. Her eliquis should be the higher PE dose for a week then lower
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
## 2018-09-02 NOTE — Patient Instructions (Signed)
Medication Instructions:  Your physician recommends that you continue on your current medications as directed. Please refer to the Current Medication list given to you today.  If you need a refill on your cardiac medications before your next appointment, please call your pharmacy.   Lab work: NONE   If you have labs (blood work) drawn today and your tests are completely normal, you will receive your results only by: Marland Kitchen MyChart Message (if you have MyChart) OR . A paper copy in the mail If you have any lab test that is abnormal or we need to change your treatment, we will call you to review the results.  Testing/Procedures: NONE   Follow-Up: At [facility], you and your health needs are our priority.  As part of our continuing mission to provide you with exceptional heart care, we have created designated Provider Care Teams.  These Care Teams include your primary Cardiologist (physician) and Advanced Practice Providers (APPs -  Physician Assistants and Nurse Practitioners) who all work together to provide you with the care you need, when you need it. You will need a follow up appointment in 3-4 weeks.  Please call our office 2 months in advance to schedule this appointment.  You may see No primary care provider on file. or one of the following Advanced Practice Providers on your designated Care Team:   [place], PA-C [facility]) . Ermalinda Barrios, PA-C ([place])  Any Other Special Instructions Will Be Listed Below (If Applicable). Thank you for choosing [facility]!

## 2018-09-02 NOTE — Telephone Encounter (Signed)
Have called pt a few times and was unable to get through until this AM. Pt states she has an appt with Dr Merlene Laughter this afternoon. Will follow up after regarding change from Lovenox to Eliquis once antiepileptics are changed to avoid drug interaction.

## 2018-09-22 ENCOUNTER — Telehealth: Payer: Self-pay | Admitting: Cardiology

## 2018-09-22 NOTE — Telephone Encounter (Signed)

## 2018-09-23 ENCOUNTER — Ambulatory Visit (INDEPENDENT_AMBULATORY_CARE_PROVIDER_SITE_OTHER): Payer: Commercial Managed Care - PPO | Admitting: Cardiology

## 2018-09-23 ENCOUNTER — Encounter: Payer: Self-pay | Admitting: Cardiology

## 2018-09-23 ENCOUNTER — Encounter: Payer: Self-pay | Admitting: *Deleted

## 2018-09-23 VITALS — BP 102/70 | HR 87 | Temp 98.2°F | Ht 62.0 in | Wt 282.0 lb

## 2018-09-23 DIAGNOSIS — I2699 Other pulmonary embolism without acute cor pulmonale: Secondary | ICD-10-CM | POA: Diagnosis not present

## 2018-09-23 DIAGNOSIS — I4891 Unspecified atrial fibrillation: Secondary | ICD-10-CM

## 2018-09-23 DIAGNOSIS — I824Z1 Acute embolism and thrombosis of unspecified deep veins of right distal lower extremity: Secondary | ICD-10-CM | POA: Diagnosis not present

## 2018-09-23 NOTE — Patient Instructions (Signed)
Your physician recommends that you schedule a follow-up appointment in: 2 MONTHS WITH DR BRANCH  Your physician recommends that you continue on your current medications as directed. Please refer to the Current Medication list given to you today.  Thank you for choosing [facility]!!    
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
## 2018-09-29 ENCOUNTER — Telehealth: Payer: Self-pay | Admitting: *Deleted

## 2018-09-29 NOTE — Telephone Encounter (Signed)
Returned your call.

## 2018-09-29 NOTE — Telephone Encounter (Signed)
Have tried to call pt multiple times yesterday and today about starting warfarin but pt does not answer cell phone and mail box has not been set up.  Called and left a message on daughters cell phone number.  Asked her to have pt call me at the office so I can get her started on warfarin and discontinue Lovenox when INR is therapeutic.

## 2018-09-29 NOTE — Telephone Encounter (Signed)
-----   Message from Leeroy Bock, [facility] sent at 09/25/2018  3:10 PM EDT ----- I have tried calling her 5x today and she hasn't answer any of the calls :/ ----- Message ----- From: Malen Gauze, RN Sent: 09/24/2018   4:48 PM EDT To: Leeroy Bock, [place]. I have tried to get in touch with this lady today but she does not answer the number listed and the mail box has not been set up.  Will you try to call her on Friday since I am off.  I was going to follow your plan to start her on 5mg  daily and continue Lovenox.  I have made her an appt for 09/29/18 @ 10:15am in [place].  If you don't get up with her, send it back and I'll try again on Monday.  Thanks, Lattie Haw ----- Message ----- From: Arnoldo Lenis, MD Sent: 09/24/2018   9:43 AM EDT To: Malen Gauze, RN  Lattie Haw can you get this patient started on coumadin as Megan recommends below and get f/u with you next week, continue lovenox until INR at goal.   Zandra Abts MD ----- Message ----- From: Leeroy Bock, [place]: 09/24/2018   7:09 AM EDT To: Arnoldo Lenis, MD  Hi Dr Harl Bowie,  I think that would be fine to start her on Coumadin if she does not want to change her seizure meds. There are some interactions with Coumadin but we can adjust her dose to counteract them. She should still be on her Lovenox bridge now - I'd recommend starting her on warfarin 5mg  daily and having her come in mid next week for an INR check. Let me know if you'd like me to coordinate any of this or if Lattie Haw can set everything up.  Thanks! Megan ----- Message ----- From: Arnoldo Lenis, MD Sent: 09/23/2018   1:21 PM EDT To: Leeroy Bock, [place],  I know that Dr Johnsie Cancel reached out to you on this patient. She is actually open to starting coumadin now, she is nervous about altering her seizure meds although Dr Arsenio Katz did suggest a weaning of her carbamazepine but she has not started. I just want to clarify with everything she is on now that coumadin  would be ok for her regarding any potential interactions, if so I can set her up out here.       Zandra Abts MD

## 2018-09-30 ENCOUNTER — Telehealth: Payer: Self-pay | Admitting: *Deleted

## 2018-09-30 DIAGNOSIS — I4891 Unspecified atrial fibrillation: Secondary | ICD-10-CM

## 2018-09-30 DIAGNOSIS — Z5181 Encounter for therapeutic drug level monitoring: Secondary | ICD-10-CM

## 2018-09-30 DIAGNOSIS — I2699 Other pulmonary embolism without acute cor pulmonale: Secondary | ICD-10-CM

## 2018-09-30 MED ORDER — WARFARIN SODIUM 5 MG PO TABS: ORAL_TABLET | ORAL | 3 refills | Status: DC

## 2018-09-30 NOTE — Telephone Encounter (Signed)
Patient called stating that she has an appointment with Coumdin on 10/01/2018. She is requesting to speak with Edrick Oh, RN.

## 2018-09-30 NOTE — Telephone Encounter (Signed)
Pt called back regarding message I left yesterday about starting warfarin.  She is in agreement to start.  She will take warfarin 5mg  daily at 6pm starting tonight.  She will also continue Lovenox twice daily that she is currently on until INR 2.0>.  She will come for INR check on Monday 6/22.  Warfarin RX sent to [facility].  Pt verbalized understanding.

## 2018-10-05 ENCOUNTER — Telehealth: Payer: Self-pay | Admitting: Cardiology

## 2018-10-05 NOTE — Telephone Encounter (Signed)
Spoke with patient.  She did not pick up and start warfarin on 6/17 as instructed.  She plans to start warfarin tonight.  She ran out of Lovenox injections Saturday morning 6/20.  The pharmacy told her her insurance will not cover anymore injections.  They have put in a call to Dr Merlene Laughter to get Lovenox reordered or approved per pt.  Explained risk to pt of being off lovenox before INR is therapeutic.  Told pt to follow up with pharmacy/Dr Doonquah's office.  She stated she would.  INR appt moved to 6/29.

## 2018-10-05 NOTE — Telephone Encounter (Signed)
Has not started medication and has questions

## 2018-10-07 ENCOUNTER — Telehealth: Payer: Self-pay | Admitting: *Deleted

## 2018-10-07 NOTE — Telephone Encounter (Signed)
Tried to call patient.  No answer x 2.  Left message for pt to call back.

## 2018-10-08 NOTE — Telephone Encounter (Signed)
6/25  Pt called back.  States her insurance will only pay for Lovenox once every 6 months.  She has been off Lovenox since last Saturday 6/20.  She did start warfarin on 6/22.  Told pt to continue warfarin as previously instructed and not to miss any doses.  Told pt if she starting developing any symptoms of previous blood clots to go to ED immediately. She has an INR appt on 6/29.  She verbalized understanding.

## 2018-10-12 ENCOUNTER — Ambulatory Visit (INDEPENDENT_AMBULATORY_CARE_PROVIDER_SITE_OTHER): Payer: Commercial Managed Care - PPO | Admitting: *Deleted

## 2018-10-12 ENCOUNTER — Other Ambulatory Visit: Payer: Self-pay

## 2018-10-12 DIAGNOSIS — Z5181 Encounter for therapeutic drug level monitoring: Secondary | ICD-10-CM | POA: Diagnosis not present

## 2018-10-12 DIAGNOSIS — I82401 Acute embolism and thrombosis of unspecified deep veins of right lower extremity: Secondary | ICD-10-CM

## 2018-10-12 DIAGNOSIS — I2690 Septic pulmonary embolism without acute cor pulmonale: Secondary | ICD-10-CM | POA: Diagnosis not present

## 2018-10-12 DIAGNOSIS — I2699 Other pulmonary embolism without acute cor pulmonale: Secondary | ICD-10-CM | POA: Diagnosis not present

## 2018-10-12 DIAGNOSIS — I4891 Unspecified atrial fibrillation: Secondary | ICD-10-CM

## 2018-10-12 LAB — POCT INR: INR: 1 — AB (ref 2.0–3.0)

## 2018-10-12 NOTE — Patient Instructions (Signed)
Started warfarin 5mg  daily on 6/22.  Has had 7 doses.  Denies missing any. Increase coumadin to 10mg  (2 tablets) daily until INR check on 7/2 Pt is out of Lovenox.  Insurance will only cover 35 days every 6 months

## 2018-10-20 ENCOUNTER — Ambulatory Visit (INDEPENDENT_AMBULATORY_CARE_PROVIDER_SITE_OTHER): Payer: Commercial Managed Care - PPO | Admitting: *Deleted

## 2018-10-20 DIAGNOSIS — I2699 Other pulmonary embolism without acute cor pulmonale: Secondary | ICD-10-CM | POA: Diagnosis not present

## 2018-10-20 DIAGNOSIS — I4891 Unspecified atrial fibrillation: Secondary | ICD-10-CM | POA: Diagnosis not present

## 2018-10-20 DIAGNOSIS — I82401 Acute embolism and thrombosis of unspecified deep veins of right lower extremity: Secondary | ICD-10-CM | POA: Diagnosis not present

## 2018-10-20 DIAGNOSIS — Z5181 Encounter for therapeutic drug level monitoring: Secondary | ICD-10-CM | POA: Diagnosis not present

## 2018-10-20 LAB — POCT INR: INR: 1.2 — AB (ref 2.0–3.0)

## 2018-10-20 MED ORDER — WARFARIN SODIUM 5 MG PO TABS: ORAL_TABLET | ORAL | 3 refills | Status: DC

## 2018-10-20 NOTE — Patient Instructions (Signed)
States taking 2 warfarin at a time made her dizzy the next morning so she only took 1 tablet daily until the last 4 days then took 1 in am and 1 in pm..  Told her this was not a normal side effect of warfarin.  Encouraged her to try again.  Explained risk again of subtherapeutic INR. Take coumadin 10mg  (2 tablets) daily until INR check on 7/14 Pt is out of Lovenox.  Insurance will only cover 35 days every 6 months

## 2018-10-27 ENCOUNTER — Ambulatory Visit (INDEPENDENT_AMBULATORY_CARE_PROVIDER_SITE_OTHER): Payer: Commercial Managed Care - PPO | Admitting: *Deleted

## 2018-10-27 DIAGNOSIS — Z5181 Encounter for therapeutic drug level monitoring: Secondary | ICD-10-CM | POA: Diagnosis not present

## 2018-10-27 DIAGNOSIS — I4891 Unspecified atrial fibrillation: Secondary | ICD-10-CM

## 2018-10-27 DIAGNOSIS — I2699 Other pulmonary embolism without acute cor pulmonale: Secondary | ICD-10-CM

## 2018-10-27 LAB — POCT INR: INR: 1.3 — AB (ref 2.0–3.0)

## 2018-10-27 NOTE — Patient Instructions (Signed)
Increase coumadin to 15mg  daily.  Recheck in 1 week. Pt is out of Lovenox.  Insurance will only cover 35 days every 6 months

## 2018-11-04 ENCOUNTER — Ambulatory Visit (INDEPENDENT_AMBULATORY_CARE_PROVIDER_SITE_OTHER): Payer: Commercial Managed Care - PPO | Admitting: *Deleted

## 2018-11-04 DIAGNOSIS — I4891 Unspecified atrial fibrillation: Secondary | ICD-10-CM

## 2018-11-04 DIAGNOSIS — Z5181 Encounter for therapeutic drug level monitoring: Secondary | ICD-10-CM

## 2018-11-04 DIAGNOSIS — I2699 Other pulmonary embolism without acute cor pulmonale: Secondary | ICD-10-CM

## 2018-11-04 DIAGNOSIS — I82401 Acute embolism and thrombosis of unspecified deep veins of right lower extremity: Secondary | ICD-10-CM

## 2018-11-04 LAB — POCT INR: INR: 2.4 (ref 2.0–3.0)

## 2018-11-04 NOTE — Patient Instructions (Signed)
Continue coumadin 15mg  daily.  Recheck in 1 week.

## 2018-11-27 ENCOUNTER — Ambulatory Visit: Payer: Commercial Managed Care - PPO | Admitting: Cardiology
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
## 2019-02-08 ENCOUNTER — Other Ambulatory Visit: Payer: Self-pay | Admitting: Cardiology

## 2019-02-10 ENCOUNTER — Other Ambulatory Visit: Payer: Self-pay

## 2019-02-10 ENCOUNTER — Ambulatory Visit (INDEPENDENT_AMBULATORY_CARE_PROVIDER_SITE_OTHER): Payer: Commercial Managed Care - PPO | Admitting: *Deleted

## 2019-02-10 DIAGNOSIS — Z5181 Encounter for therapeutic drug level monitoring: Secondary | ICD-10-CM | POA: Diagnosis not present

## 2019-02-10 DIAGNOSIS — I2699 Other pulmonary embolism without acute cor pulmonale: Secondary | ICD-10-CM | POA: Diagnosis not present

## 2019-02-10 DIAGNOSIS — I4891 Unspecified atrial fibrillation: Secondary | ICD-10-CM | POA: Diagnosis not present

## 2019-02-10 LAB — POCT INR: INR: 1.1 — AB (ref 2.0–3.0)

## 2019-02-10 MED ORDER — WARFARIN SODIUM 5 MG PO TABS: ORAL_TABLET | ORAL | 1 refills | Status: DC

## 2019-02-10 NOTE — Patient Instructions (Signed)
Has been out of warfarin 4-5 days Restart warfarin 15mg  daily.  Recheck in 2 wks.

## 2019-04-13 ENCOUNTER — Other Ambulatory Visit: Payer: Self-pay | Admitting: Cardiology

## 2019-04-20 ENCOUNTER — Other Ambulatory Visit: Payer: Self-pay | Admitting: Cardiology

## 2019-05-03 ENCOUNTER — Other Ambulatory Visit: Payer: Self-pay

## 2019-05-03 ENCOUNTER — Ambulatory Visit (INDEPENDENT_AMBULATORY_CARE_PROVIDER_SITE_OTHER): Payer: Commercial Managed Care - PPO | Admitting: *Deleted

## 2019-05-03 DIAGNOSIS — I2699 Other pulmonary embolism without acute cor pulmonale: Secondary | ICD-10-CM

## 2019-05-03 DIAGNOSIS — Z5181 Encounter for therapeutic drug level monitoring: Secondary | ICD-10-CM

## 2019-05-03 DIAGNOSIS — I4891 Unspecified atrial fibrillation: Secondary | ICD-10-CM

## 2019-05-03 LAB — POCT INR: INR: 1.1 — AB (ref 2.0–3.0)

## 2019-05-03 MED ORDER — WARFARIN SODIUM 5 MG PO TABS: ORAL_TABLET | ORAL | 0 refills | Status: DC

## 2019-05-03 NOTE — Patient Instructions (Signed)
Has been out of warfarin 3 days Restart warfarin 15mg  daily.  Recheck in 2 wks.

## 2019-05-14 ENCOUNTER — Other Ambulatory Visit: Payer: Self-pay | Admitting: Cardiology

## 2019-05-17 ENCOUNTER — Other Ambulatory Visit: Payer: Self-pay

## 2019-05-17 ENCOUNTER — Ambulatory Visit (INDEPENDENT_AMBULATORY_CARE_PROVIDER_SITE_OTHER): Payer: Commercial Managed Care - PPO | Admitting: *Deleted

## 2019-05-17 DIAGNOSIS — I4891 Unspecified atrial fibrillation: Secondary | ICD-10-CM

## 2019-05-17 DIAGNOSIS — Z5181 Encounter for therapeutic drug level monitoring: Secondary | ICD-10-CM | POA: Diagnosis not present

## 2019-05-17 DIAGNOSIS — I2699 Other pulmonary embolism without acute cor pulmonale: Secondary | ICD-10-CM | POA: Diagnosis not present

## 2019-05-17 LAB — POCT INR: INR: 3.1 — AB (ref 2.0–3.0)

## 2019-05-17 NOTE — Patient Instructions (Signed)
Decrease warfarin to 3 tablets daily except 2 tablet on Tuesdays Recheck in 2 wks.

## 2019-06-03 ENCOUNTER — Telehealth (INDEPENDENT_AMBULATORY_CARE_PROVIDER_SITE_OTHER): Payer: Commercial Managed Care - PPO | Admitting: Cardiology

## 2019-06-03 ENCOUNTER — Encounter: Payer: Self-pay | Admitting: Cardiology

## 2019-06-03 VITALS — BP 135/95 | HR 75 | Ht 62.0 in | Wt 310.0 lb

## 2019-06-03 DIAGNOSIS — R0600 Dyspnea, unspecified: Secondary | ICD-10-CM | POA: Diagnosis not present

## 2019-06-03 DIAGNOSIS — Z86718 Personal history of other venous thrombosis and embolism: Secondary | ICD-10-CM | POA: Diagnosis not present

## 2019-06-03 DIAGNOSIS — I4891 Unspecified atrial fibrillation: Secondary | ICD-10-CM | POA: Diagnosis not present

## 2019-06-03 DIAGNOSIS — R0609 Other forms of dyspnea: Secondary | ICD-10-CM

## 2019-06-03 DIAGNOSIS — Z86711 Personal history of pulmonary embolism: Secondary | ICD-10-CM

## 2019-06-03 DIAGNOSIS — I2699 Other pulmonary embolism without acute cor pulmonale: Secondary | ICD-10-CM

## 2019-06-03 NOTE — Patient Instructions (Signed)
Your physician recommends that you schedule a follow-up appointment in: [place]  Your physician recommends that you continue on your current medications as directed. Please refer to the Current Medication list given to you today.  Your physician has requested that you have an echocardiogram. Echocardiography is a painless test that uses sound waves to create images of your heart. It provides your doctor with information about the size and shape of your heart and how well your heart's chambers and valves are working. This procedure takes approximately one hour. There are no restrictions for this procedure.  Thank you for choosing [facility]!!

## 2019-06-03 NOTE — Addendum Note (Signed)
Addended by: Julian Hy T on: 06/03/2019 04:14 PM   Modules accepted: Orders
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
## 2019-06-07 ENCOUNTER — Ambulatory Visit (INDEPENDENT_AMBULATORY_CARE_PROVIDER_SITE_OTHER): Payer: Commercial Managed Care - PPO | Admitting: *Deleted

## 2019-06-07 ENCOUNTER — Telehealth: Payer: Self-pay | Admitting: Cardiology

## 2019-06-07 ENCOUNTER — Other Ambulatory Visit: Payer: Self-pay

## 2019-06-07 DIAGNOSIS — I4891 Unspecified atrial fibrillation: Secondary | ICD-10-CM | POA: Diagnosis not present

## 2019-06-07 DIAGNOSIS — Z5181 Encounter for therapeutic drug level monitoring: Secondary | ICD-10-CM | POA: Diagnosis not present

## 2019-06-07 DIAGNOSIS — I2699 Other pulmonary embolism without acute cor pulmonale: Secondary | ICD-10-CM | POA: Diagnosis not present

## 2019-06-07 LAB — POCT INR: INR: 3.5 — AB (ref 2.0–3.0)

## 2019-06-07 NOTE — Patient Instructions (Signed)
Do not take warfarin Tuesday then decrease dose to 3 tablets daily except 2 tablets on Tuesdays, Thursdays and Saturdays Recheck in 3 wks.

## 2019-06-07 NOTE — Telephone Encounter (Signed)
Pre-cert Verification for the following procedure   Echo scheduled for 06/10/2019 at [facility]

## 2019-06-10 ENCOUNTER — Ambulatory Visit (HOSPITAL_COMMUNITY): Payer: Commercial Managed Care - PPO | Attending: Cardiology

## 2019-06-24 ENCOUNTER — Other Ambulatory Visit: Payer: Self-pay | Admitting: Cardiology

## 2019-06-24 ENCOUNTER — Ambulatory Visit: Payer: Commercial Managed Care - PPO | Admitting: Cardiology

## 2019-06-24 ENCOUNTER — Telehealth: Payer: Self-pay | Admitting: Cardiology

## 2019-06-24 NOTE — Telephone Encounter (Signed)
  Precert needed for: Echo   Location:   [facility]   Date: July 22, 2019

## 2019-06-28 ENCOUNTER — Other Ambulatory Visit: Payer: Self-pay

## 2019-06-28 ENCOUNTER — Ambulatory Visit (INDEPENDENT_AMBULATORY_CARE_PROVIDER_SITE_OTHER): Payer: Commercial Managed Care - PPO | Admitting: *Deleted

## 2019-06-28 DIAGNOSIS — I2699 Other pulmonary embolism without acute cor pulmonale: Secondary | ICD-10-CM | POA: Diagnosis not present

## 2019-06-28 DIAGNOSIS — Z5181 Encounter for therapeutic drug level monitoring: Secondary | ICD-10-CM | POA: Diagnosis not present

## 2019-06-28 DIAGNOSIS — I4891 Unspecified atrial fibrillation: Secondary | ICD-10-CM | POA: Diagnosis not present

## 2019-06-28 LAB — POCT INR: INR: 2.9 (ref 2.0–3.0)

## 2019-06-28 NOTE — Patient Instructions (Signed)
Continue warfarin 3 tablets daily except 2 tablets on Tuesdays, Thursdays and Saturdays Recheck in 4 wks.

## 2019-07-22 ENCOUNTER — Ambulatory Visit (INDEPENDENT_AMBULATORY_CARE_PROVIDER_SITE_OTHER): Payer: Commercial Managed Care - PPO | Admitting: *Deleted

## 2019-07-22 ENCOUNTER — Ambulatory Visit (INDEPENDENT_AMBULATORY_CARE_PROVIDER_SITE_OTHER): Payer: Commercial Managed Care - PPO

## 2019-07-22 ENCOUNTER — Other Ambulatory Visit: Payer: Self-pay

## 2019-07-22 DIAGNOSIS — Z5181 Encounter for therapeutic drug level monitoring: Secondary | ICD-10-CM | POA: Diagnosis not present

## 2019-07-22 DIAGNOSIS — R0609 Other forms of dyspnea: Secondary | ICD-10-CM

## 2019-07-22 DIAGNOSIS — I4891 Unspecified atrial fibrillation: Secondary | ICD-10-CM

## 2019-07-22 DIAGNOSIS — I2699 Other pulmonary embolism without acute cor pulmonale: Secondary | ICD-10-CM

## 2019-07-22 DIAGNOSIS — R0600 Dyspnea, unspecified: Secondary | ICD-10-CM

## 2019-07-22 LAB — POCT INR: INR: 2.7 (ref 2.0–3.0)

## 2019-07-22 NOTE — Patient Instructions (Signed)
Continue warfarin 3 tablets daily except 2 tablets on Tuesdays, Thursdays and Saturdays Recheck in 4 wks.

## 2019-07-29 ENCOUNTER — Telehealth: Payer: Self-pay | Admitting: *Deleted

## 2019-07-29 NOTE — Telephone Encounter (Signed)
Pt aware - routed to pcp  

## 2019-07-29 NOTE — Telephone Encounter (Signed)
-----   Message from Arnoldo Lenis, MD sent at 07/28/2019  1:11 PM EDT ----- Echo overall shows normal heart function   Zandra Abts MD

## 2019-08-19 ENCOUNTER — Ambulatory Visit (INDEPENDENT_AMBULATORY_CARE_PROVIDER_SITE_OTHER): Payer: Commercial Managed Care - PPO | Admitting: *Deleted

## 2019-08-19 ENCOUNTER — Other Ambulatory Visit: Payer: Self-pay

## 2019-08-19 DIAGNOSIS — I2699 Other pulmonary embolism without acute cor pulmonale: Secondary | ICD-10-CM

## 2019-08-19 DIAGNOSIS — I4891 Unspecified atrial fibrillation: Secondary | ICD-10-CM | POA: Diagnosis not present

## 2019-08-19 DIAGNOSIS — Z5181 Encounter for therapeutic drug level monitoring: Secondary | ICD-10-CM

## 2019-08-19 LAB — POCT INR: INR: 2.9 (ref 2.0–3.0)

## 2019-08-19 NOTE — Patient Instructions (Signed)
Continue warfarin 3 tablets daily except 2 tablets on Tuesdays, Thursdays and Saturdays Recheck in 6 wks.

## 2019-08-20 ENCOUNTER — Ambulatory Visit: Payer: Commercial Managed Care - PPO | Admitting: Cardiology
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
## 2019-08-30 ENCOUNTER — Other Ambulatory Visit: Payer: Self-pay

## 2019-08-30 ENCOUNTER — Encounter: Payer: Self-pay | Admitting: Family Medicine

## 2019-08-30 ENCOUNTER — Ambulatory Visit (INDEPENDENT_AMBULATORY_CARE_PROVIDER_SITE_OTHER): Payer: Commercial Managed Care - PPO | Admitting: Family Medicine

## 2019-08-30 VITALS — BP 92/68 | HR 81 | Ht 62.0 in | Wt 349.0 lb

## 2019-08-30 DIAGNOSIS — R0600 Dyspnea, unspecified: Secondary | ICD-10-CM

## 2019-08-30 DIAGNOSIS — Z86718 Personal history of other venous thrombosis and embolism: Secondary | ICD-10-CM | POA: Diagnosis not present

## 2019-08-30 DIAGNOSIS — R0609 Other forms of dyspnea: Secondary | ICD-10-CM

## 2019-08-30 DIAGNOSIS — I4891 Unspecified atrial fibrillation: Secondary | ICD-10-CM

## 2019-08-30 DIAGNOSIS — I2699 Other pulmonary embolism without acute cor pulmonale: Secondary | ICD-10-CM

## 2019-08-30 NOTE — Patient Instructions (Signed)

## 2019-09-30 ENCOUNTER — Ambulatory Visit (INDEPENDENT_AMBULATORY_CARE_PROVIDER_SITE_OTHER): Payer: Commercial Managed Care - PPO | Admitting: *Deleted

## 2019-09-30 DIAGNOSIS — I4891 Unspecified atrial fibrillation: Secondary | ICD-10-CM

## 2019-09-30 DIAGNOSIS — Z5181 Encounter for therapeutic drug level monitoring: Secondary | ICD-10-CM

## 2019-09-30 DIAGNOSIS — I2699 Other pulmonary embolism without acute cor pulmonale: Secondary | ICD-10-CM | POA: Diagnosis not present

## 2019-09-30 LAB — POCT INR: INR: 3.6 — AB (ref 2.0–3.0)

## 2019-09-30 NOTE — Patient Instructions (Signed)
Hold warfarin tomorrow then resume 3 tablets daily except 2 tablets on Tuesdays, Thursdays and Saturdays Recheck in 4 wks.

## 2019-11-21 ENCOUNTER — Other Ambulatory Visit: Payer: Self-pay | Admitting: Cardiology

## 2019-12-02 ENCOUNTER — Ambulatory Visit (INDEPENDENT_AMBULATORY_CARE_PROVIDER_SITE_OTHER): Payer: Self-pay | Admitting: *Deleted

## 2019-12-02 DIAGNOSIS — I2699 Other pulmonary embolism without acute cor pulmonale: Secondary | ICD-10-CM

## 2019-12-02 DIAGNOSIS — I4891 Unspecified atrial fibrillation: Secondary | ICD-10-CM

## 2019-12-02 DIAGNOSIS — Z5181 Encounter for therapeutic drug level monitoring: Secondary | ICD-10-CM

## 2019-12-02 LAB — POCT INR: INR: 2.1 (ref 2.0–3.0)

## 2019-12-02 NOTE — Patient Instructions (Signed)
Continue warfarin 3 tablets daily except 2 tablets on Tuesdays, Thursdays and Saturdays Recheck in 4 wks.

## 2019-12-30 ENCOUNTER — Ambulatory Visit (INDEPENDENT_AMBULATORY_CARE_PROVIDER_SITE_OTHER): Payer: Self-pay | Admitting: *Deleted

## 2019-12-30 ENCOUNTER — Other Ambulatory Visit: Payer: Self-pay

## 2019-12-30 DIAGNOSIS — Z5181 Encounter for therapeutic drug level monitoring: Secondary | ICD-10-CM

## 2019-12-30 DIAGNOSIS — I2699 Other pulmonary embolism without acute cor pulmonale: Secondary | ICD-10-CM

## 2019-12-30 DIAGNOSIS — I4891 Unspecified atrial fibrillation: Secondary | ICD-10-CM

## 2019-12-30 LAB — POCT INR: INR: 2.5 (ref 2.0–3.0)

## 2019-12-30 NOTE — Patient Instructions (Signed)
Continue warfarin 3 tablets daily except 2 tablets on Tuesdays, Thursdays and Saturdays Recheck in 5 wks.

## 2020-02-03 ENCOUNTER — Other Ambulatory Visit: Payer: Self-pay

## 2020-02-03 ENCOUNTER — Ambulatory Visit (INDEPENDENT_AMBULATORY_CARE_PROVIDER_SITE_OTHER): Payer: Self-pay | Admitting: Pharmacist

## 2020-02-03 DIAGNOSIS — I2699 Other pulmonary embolism without acute cor pulmonale: Secondary | ICD-10-CM

## 2020-02-03 DIAGNOSIS — Z5181 Encounter for therapeutic drug level monitoring: Secondary | ICD-10-CM

## 2020-02-03 DIAGNOSIS — I4891 Unspecified atrial fibrillation: Secondary | ICD-10-CM

## 2020-02-03 DIAGNOSIS — I824Y1 Acute embolism and thrombosis of unspecified deep veins of right proximal lower extremity: Secondary | ICD-10-CM

## 2020-02-03 LAB — POCT INR: INR: 1.7 — AB (ref 2.0–3.0)

## 2020-02-03 NOTE — Patient Instructions (Signed)
Description   Take 3 tablets today and then Continue warfarin 3 tablets daily except 2 tablets on Tuesdays, Thursdays and Saturdays Recheck in 4 weeks

## 2020-02-16 ENCOUNTER — Encounter: Payer: Self-pay | Admitting: Cardiology

## 2020-02-16 ENCOUNTER — Ambulatory Visit (INDEPENDENT_AMBULATORY_CARE_PROVIDER_SITE_OTHER): Payer: Self-pay | Admitting: Cardiology

## 2020-02-16 VITALS — BP 118/70 | HR 76 | Ht 62.0 in | Wt 352.2 lb

## 2020-02-16 DIAGNOSIS — R0609 Other forms of dyspnea: Secondary | ICD-10-CM

## 2020-02-16 DIAGNOSIS — I4891 Unspecified atrial fibrillation: Secondary | ICD-10-CM

## 2020-02-16 DIAGNOSIS — I2699 Other pulmonary embolism without acute cor pulmonale: Secondary | ICD-10-CM

## 2020-02-16 DIAGNOSIS — R0600 Dyspnea, unspecified: Secondary | ICD-10-CM
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
## 2020-02-16 NOTE — Patient Instructions (Signed)

## 2020-02-29 ENCOUNTER — Other Ambulatory Visit: Payer: Self-pay | Admitting: Cardiology

## 2020-03-02 ENCOUNTER — Ambulatory Visit: Payer: Commercial Managed Care - PPO | Admitting: Cardiology

## 2020-03-14 ENCOUNTER — Ambulatory Visit (INDEPENDENT_AMBULATORY_CARE_PROVIDER_SITE_OTHER): Payer: Self-pay | Admitting: *Deleted

## 2020-03-14 DIAGNOSIS — Z5181 Encounter for therapeutic drug level monitoring: Secondary | ICD-10-CM

## 2020-03-14 DIAGNOSIS — I2699 Other pulmonary embolism without acute cor pulmonale: Secondary | ICD-10-CM

## 2020-03-14 DIAGNOSIS — I4891 Unspecified atrial fibrillation: Secondary | ICD-10-CM

## 2020-03-14 LAB — POCT INR: INR: 2.7 (ref 2.0–3.0)

## 2020-03-14 NOTE — Patient Instructions (Signed)
Continue warfarin 3 tablets daily except 2 tablets on Tuesdays, Thursdays and Saturdays Recheck in 4 weeks

## 2020-04-18 ENCOUNTER — Ambulatory Visit (INDEPENDENT_AMBULATORY_CARE_PROVIDER_SITE_OTHER): Payer: Self-pay | Admitting: *Deleted

## 2020-04-18 DIAGNOSIS — I2699 Other pulmonary embolism without acute cor pulmonale: Secondary | ICD-10-CM

## 2020-04-18 DIAGNOSIS — Z5181 Encounter for therapeutic drug level monitoring: Secondary | ICD-10-CM

## 2020-04-18 DIAGNOSIS — I4891 Unspecified atrial fibrillation: Secondary | ICD-10-CM

## 2020-04-18 LAB — POCT INR: INR: 3.4 — AB (ref 2.0–3.0)

## 2020-04-18 NOTE — Patient Instructions (Signed)
Took warfarin this am Take warfarin 1 tablet tomorrow then resume 3 tablets daily except 2 tablets on Tuesdays, Thursdays and Saturdays Recheck in 3 weeks

## 2020-05-02 ENCOUNTER — Other Ambulatory Visit: Payer: Self-pay | Admitting: Cardiology

## 2020-05-02 DIAGNOSIS — I4891 Unspecified atrial fibrillation: Secondary | ICD-10-CM

## 2020-05-11 ENCOUNTER — Ambulatory Visit (INDEPENDENT_AMBULATORY_CARE_PROVIDER_SITE_OTHER): Payer: Self-pay | Admitting: *Deleted

## 2020-05-11 ENCOUNTER — Other Ambulatory Visit: Payer: Self-pay

## 2020-05-11 DIAGNOSIS — Z5181 Encounter for therapeutic drug level monitoring: Secondary | ICD-10-CM

## 2020-05-11 DIAGNOSIS — I4891 Unspecified atrial fibrillation: Secondary | ICD-10-CM

## 2020-05-11 DIAGNOSIS — I2699 Other pulmonary embolism without acute cor pulmonale: Secondary | ICD-10-CM

## 2020-05-11 LAB — POCT INR: INR: 2.6 (ref 2.0–3.0)

## 2020-05-11 MED ORDER — WARFARIN SODIUM 5 MG PO TABS: ORAL_TABLET | ORAL | 3 refills | Status: DC

## 2020-05-11 NOTE — Patient Instructions (Signed)
Took warfarin this am Continue warfarin 3 tablets daily except 2 tablets on Tuesdays, Thursdays and Saturdays Recheck in 4 weeks

## 2020-05-12 ENCOUNTER — Telehealth: Payer: Self-pay

## 2020-05-12 NOTE — Telephone Encounter (Signed)
Left message for patient to call back - per Dr Harl Bowie the disability forms that we received needs to be filled out by PCP.

## 2020-06-08 ENCOUNTER — Ambulatory Visit (INDEPENDENT_AMBULATORY_CARE_PROVIDER_SITE_OTHER): Payer: Medicaid Other | Admitting: *Deleted

## 2020-06-08 DIAGNOSIS — Z5181 Encounter for therapeutic drug level monitoring: Secondary | ICD-10-CM | POA: Diagnosis not present

## 2020-06-08 DIAGNOSIS — I4891 Unspecified atrial fibrillation: Secondary | ICD-10-CM

## 2020-06-08 DIAGNOSIS — I2699 Other pulmonary embolism without acute cor pulmonale: Secondary | ICD-10-CM

## 2020-06-08 LAB — POCT INR: INR: 3.2 — AB (ref 2.0–3.0)

## 2020-06-08 NOTE — Patient Instructions (Signed)
Took warfarin this am Take warfarin 1 tablet tomorrow then resume 3 tablets daily except 2 tablets on Tuesdays, Thursdays and Saturdays Recheck in 3 weeks

## 2020-06-29 ENCOUNTER — Ambulatory Visit (INDEPENDENT_AMBULATORY_CARE_PROVIDER_SITE_OTHER): Payer: Medicaid Other | Admitting: *Deleted

## 2020-06-29 DIAGNOSIS — I2699 Other pulmonary embolism without acute cor pulmonale: Secondary | ICD-10-CM

## 2020-06-29 DIAGNOSIS — I4891 Unspecified atrial fibrillation: Secondary | ICD-10-CM | POA: Diagnosis not present

## 2020-06-29 DIAGNOSIS — Z5181 Encounter for therapeutic drug level monitoring: Secondary | ICD-10-CM

## 2020-06-29 LAB — POCT INR: INR: 3.3 — AB (ref 2.0–3.0)

## 2020-06-29 NOTE — Patient Instructions (Signed)
Hold warfarin today then decrease dose to 2 tablets daily except 3 tablets on Mondays and Thursdays Recheck in 3 weeks

## 2020-07-20 ENCOUNTER — Ambulatory Visit (INDEPENDENT_AMBULATORY_CARE_PROVIDER_SITE_OTHER): Payer: Medicaid Other | Admitting: *Deleted

## 2020-07-20 ENCOUNTER — Other Ambulatory Visit: Payer: Self-pay

## 2020-07-20 DIAGNOSIS — I2699 Other pulmonary embolism without acute cor pulmonale: Secondary | ICD-10-CM | POA: Diagnosis not present

## 2020-07-20 DIAGNOSIS — I4891 Unspecified atrial fibrillation: Secondary | ICD-10-CM

## 2020-07-20 DIAGNOSIS — Z5181 Encounter for therapeutic drug level monitoring: Secondary | ICD-10-CM

## 2020-07-20 LAB — POCT INR: INR: 2.2 (ref 2.0–3.0)

## 2020-07-20 NOTE — Patient Instructions (Signed)
Continue warfarin 2 tablets daily except 3 tablets on Mondays and Thursdays Recheck in 4 weeks 

## 2020-08-24 ENCOUNTER — Ambulatory Visit (INDEPENDENT_AMBULATORY_CARE_PROVIDER_SITE_OTHER): Payer: Medicaid Other | Admitting: *Deleted

## 2020-08-24 DIAGNOSIS — I2699 Other pulmonary embolism without acute cor pulmonale: Secondary | ICD-10-CM

## 2020-08-24 DIAGNOSIS — I4891 Unspecified atrial fibrillation: Secondary | ICD-10-CM | POA: Diagnosis not present

## 2020-08-24 DIAGNOSIS — Z5181 Encounter for therapeutic drug level monitoring: Secondary | ICD-10-CM | POA: Diagnosis not present

## 2020-08-24 LAB — POCT INR: INR: 2.3 (ref 2.0–3.0)

## 2020-08-24 NOTE — Patient Instructions (Addendum)
Continue warfarin 2 tablets daily except 3 tablets on Mondays and Fridays Recheck in 5 weeks 
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
## 2020-09-28 ENCOUNTER — Encounter: Payer: Self-pay | Admitting: Family Medicine

## 2020-09-28 ENCOUNTER — Ambulatory Visit (INDEPENDENT_AMBULATORY_CARE_PROVIDER_SITE_OTHER): Payer: Medicaid Other | Admitting: Family Medicine

## 2020-09-28 ENCOUNTER — Ambulatory Visit (INDEPENDENT_AMBULATORY_CARE_PROVIDER_SITE_OTHER): Payer: Medicaid Other | Admitting: *Deleted

## 2020-09-28 VITALS — BP 100/68 | HR 88 | Ht 62.0 in | Wt 322.2 lb

## 2020-09-28 DIAGNOSIS — I4891 Unspecified atrial fibrillation: Secondary | ICD-10-CM

## 2020-09-28 DIAGNOSIS — Z5181 Encounter for therapeutic drug level monitoring: Secondary | ICD-10-CM | POA: Diagnosis not present

## 2020-09-28 DIAGNOSIS — Z86711 Personal history of pulmonary embolism: Secondary | ICD-10-CM | POA: Diagnosis not present

## 2020-09-28 DIAGNOSIS — I2699 Other pulmonary embolism without acute cor pulmonale: Secondary | ICD-10-CM | POA: Diagnosis not present

## 2020-09-28 DIAGNOSIS — R0609 Other forms of dyspnea: Secondary | ICD-10-CM

## 2020-09-28 DIAGNOSIS — R0600 Dyspnea, unspecified: Secondary | ICD-10-CM | POA: Diagnosis not present

## 2020-09-28 LAB — POCT INR: INR: 2.3 (ref 2.0–3.0)

## 2020-09-28 MED ORDER — WARFARIN SODIUM 5 MG PO TABS: ORAL_TABLET | ORAL | 3 refills | Status: DC

## 2020-09-28 NOTE — Addendum Note (Signed)
Addended by: Malen Gauze on: 09/28/2020 01:38 PM   Modules accepted: Orders

## 2020-09-28 NOTE — Patient Instructions (Signed)
Medication Instructions:  Continue all current medications.   Labwork: none  Testing/Procedures: none  Follow-Up: 6 months   Any Other Special Instructions Will Be Listed Below (If Applicable).   If you need a refill on your cardiac medications before your next appointment, please call your pharmacy.  

## 2020-09-28 NOTE — Patient Instructions (Signed)
Continue warfarin 2 tablets daily except 3 tablets on Mondays and Fridays Recheck in 6 weeks 

## 2020-10-28 ENCOUNTER — Other Ambulatory Visit: Payer: Self-pay | Admitting: Cardiology

## 2020-10-28 DIAGNOSIS — I4891 Unspecified atrial fibrillation: Secondary | ICD-10-CM

## 2020-11-09 ENCOUNTER — Other Ambulatory Visit: Payer: Self-pay

## 2020-11-09 ENCOUNTER — Ambulatory Visit (INDEPENDENT_AMBULATORY_CARE_PROVIDER_SITE_OTHER): Payer: Medicaid Other | Admitting: *Deleted

## 2020-11-09 DIAGNOSIS — I2699 Other pulmonary embolism without acute cor pulmonale: Secondary | ICD-10-CM | POA: Diagnosis not present

## 2020-11-09 DIAGNOSIS — Z5181 Encounter for therapeutic drug level monitoring: Secondary | ICD-10-CM | POA: Diagnosis not present

## 2020-11-09 DIAGNOSIS — I4891 Unspecified atrial fibrillation: Secondary | ICD-10-CM | POA: Diagnosis not present

## 2020-11-09 LAB — POCT INR: INR: 2.1 (ref 2.0–3.0)

## 2020-11-09 NOTE — Patient Instructions (Signed)
Continue warfarin 2 tablets daily except 3 tablets on Mondays and Fridays Recheck in 6 weeks 

## 2020-12-25 ENCOUNTER — Ambulatory Visit (INDEPENDENT_AMBULATORY_CARE_PROVIDER_SITE_OTHER): Payer: Medicaid Other | Admitting: *Deleted

## 2020-12-25 DIAGNOSIS — I4891 Unspecified atrial fibrillation: Secondary | ICD-10-CM

## 2020-12-25 DIAGNOSIS — Z5181 Encounter for therapeutic drug level monitoring: Secondary | ICD-10-CM | POA: Diagnosis not present

## 2020-12-25 DIAGNOSIS — I2699 Other pulmonary embolism without acute cor pulmonale: Secondary | ICD-10-CM

## 2020-12-25 LAB — POCT INR: INR: 1.6 — AB (ref 2.0–3.0)

## 2020-12-25 NOTE — Patient Instructions (Signed)
Take warfarin 4 tablets tonight, 3 tablets tomorrow night then resume 2 tablets daily except 3 tablets on Mondays and Fridays Recheck in 3 weeks 

## 2021-01-10 ENCOUNTER — Other Ambulatory Visit: Payer: Self-pay | Admitting: Cardiology

## 2021-01-10 DIAGNOSIS — I4891 Unspecified atrial fibrillation: Secondary | ICD-10-CM

## 2021-01-15 ENCOUNTER — Ambulatory Visit (INDEPENDENT_AMBULATORY_CARE_PROVIDER_SITE_OTHER): Payer: Medicaid Other | Admitting: *Deleted

## 2021-01-15 ENCOUNTER — Other Ambulatory Visit: Payer: Self-pay

## 2021-01-15 DIAGNOSIS — I2699 Other pulmonary embolism without acute cor pulmonale: Secondary | ICD-10-CM

## 2021-01-15 DIAGNOSIS — Z5181 Encounter for therapeutic drug level monitoring: Secondary | ICD-10-CM

## 2021-01-15 DIAGNOSIS — I4891 Unspecified atrial fibrillation: Secondary | ICD-10-CM

## 2021-01-15 LAB — POCT INR: INR: 2 (ref 2.0–3.0)

## 2021-01-15 NOTE — Patient Instructions (Signed)
Increase warfarin to 2 tablets daily except 3 tablets on Mondays, Wednesdays and Fridays Recheck in 4 weeks

## 2021-02-14 ENCOUNTER — Ambulatory Visit (INDEPENDENT_AMBULATORY_CARE_PROVIDER_SITE_OTHER): Payer: Medicaid Other | Admitting: *Deleted

## 2021-02-14 DIAGNOSIS — I2699 Other pulmonary embolism without acute cor pulmonale: Secondary | ICD-10-CM

## 2021-02-14 DIAGNOSIS — I4891 Unspecified atrial fibrillation: Secondary | ICD-10-CM

## 2021-02-14 DIAGNOSIS — Z5181 Encounter for therapeutic drug level monitoring: Secondary | ICD-10-CM | POA: Diagnosis not present

## 2021-02-14 LAB — POCT INR: INR: 2.2 (ref 2.0–3.0)

## 2021-02-14 NOTE — Patient Instructions (Signed)
Continue warfarin 2 tablets daily except 3 tablets on Mondays, Wednesdays and Fridays Recheck in 4 weeks

## 2021-03-26 ENCOUNTER — Encounter: Payer: Self-pay | Admitting: Cardiology

## 2021-03-26 ENCOUNTER — Ambulatory Visit (INDEPENDENT_AMBULATORY_CARE_PROVIDER_SITE_OTHER): Payer: Medicaid Other | Admitting: *Deleted

## 2021-03-26 ENCOUNTER — Ambulatory Visit (INDEPENDENT_AMBULATORY_CARE_PROVIDER_SITE_OTHER): Payer: Medicaid Other | Admitting: Cardiology

## 2021-03-26 VITALS — BP 138/100 | HR 72 | Ht 62.0 in | Wt 299.2 lb

## 2021-03-26 DIAGNOSIS — I2699 Other pulmonary embolism without acute cor pulmonale: Secondary | ICD-10-CM

## 2021-03-26 DIAGNOSIS — Z5181 Encounter for therapeutic drug level monitoring: Secondary | ICD-10-CM

## 2021-03-26 DIAGNOSIS — I4891 Unspecified atrial fibrillation: Secondary | ICD-10-CM | POA: Diagnosis not present

## 2021-03-26 DIAGNOSIS — Z86711 Personal history of pulmonary embolism: Secondary | ICD-10-CM | POA: Diagnosis not present

## 2021-03-26 DIAGNOSIS — R0609 Other forms of dyspnea: Secondary | ICD-10-CM

## 2021-03-26 DIAGNOSIS — Z23 Encounter for immunization: Secondary | ICD-10-CM | POA: Diagnosis not present

## 2021-03-26 LAB — POCT INR: INR: 4 — AB (ref 2.0–3.0)
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
## 2021-03-26 NOTE — Patient Instructions (Signed)

## 2021-03-26 NOTE — Patient Instructions (Signed)
Hold warfarin tonight then resume 2 tablets daily except 3 tablets on Mondays, Wednesdays and Fridays Recheck in 3 weeks

## 2021-03-30 ENCOUNTER — Ambulatory Visit: Payer: Medicaid Other | Admitting: Cardiology

## 2021-04-11 ENCOUNTER — Other Ambulatory Visit: Payer: Self-pay | Admitting: Physician Assistant

## 2021-04-11 ENCOUNTER — Telehealth: Payer: Self-pay

## 2021-04-11 ENCOUNTER — Other Ambulatory Visit (HOSPITAL_COMMUNITY): Payer: Self-pay | Admitting: Physician Assistant

## 2021-04-11 DIAGNOSIS — N939 Abnormal uterine and vaginal bleeding, unspecified: Secondary | ICD-10-CM

## 2021-04-18 ENCOUNTER — Emergency Department (HOSPITAL_COMMUNITY): Payer: Medicaid Other

## 2021-04-18 ENCOUNTER — Inpatient Hospital Stay (HOSPITAL_COMMUNITY)
Admission: EM | Admit: 2021-04-18 | Discharge: 2021-04-29 | DRG: 669 | Disposition: A | Discharge status: Home / Self Care | Payer: Medicaid Other | Attending: Internal Medicine | Admitting: Internal Medicine

## 2021-04-18 ENCOUNTER — Other Ambulatory Visit: Payer: Self-pay

## 2021-04-18 ENCOUNTER — Ambulatory Visit (INDEPENDENT_AMBULATORY_CARE_PROVIDER_SITE_OTHER): Payer: Medicaid Other | Admitting: *Deleted

## 2021-04-18 ENCOUNTER — Telehealth: Payer: Self-pay | Admitting: *Deleted

## 2021-04-18 ENCOUNTER — Encounter (HOSPITAL_COMMUNITY): Payer: Self-pay

## 2021-04-18 DIAGNOSIS — I10 Essential (primary) hypertension: Secondary | ICD-10-CM | POA: Diagnosis not present

## 2021-04-18 DIAGNOSIS — R791 Abnormal coagulation profile: Secondary | ICD-10-CM

## 2021-04-18 DIAGNOSIS — N1330 Unspecified hydronephrosis: Secondary | ICD-10-CM | POA: Diagnosis not present

## 2021-04-18 DIAGNOSIS — E119 Type 2 diabetes mellitus without complications: Secondary | ICD-10-CM | POA: Diagnosis present

## 2021-04-18 DIAGNOSIS — N131 Hydronephrosis with ureteral stricture, not elsewhere classified: Secondary | ICD-10-CM | POA: Diagnosis present

## 2021-04-18 DIAGNOSIS — N179 Acute kidney failure, unspecified: Secondary | ICD-10-CM | POA: Diagnosis not present

## 2021-04-18 DIAGNOSIS — Z20822 Contact with and (suspected) exposure to covid-19: Secondary | ICD-10-CM | POA: Diagnosis present

## 2021-04-18 DIAGNOSIS — D72829 Elevated white blood cell count, unspecified: Secondary | ICD-10-CM | POA: Diagnosis present

## 2021-04-18 DIAGNOSIS — D649 Anemia, unspecified: Secondary | ICD-10-CM | POA: Diagnosis present

## 2021-04-18 DIAGNOSIS — I2699 Other pulmonary embolism without acute cor pulmonale: Secondary | ICD-10-CM | POA: Diagnosis not present

## 2021-04-18 DIAGNOSIS — I480 Paroxysmal atrial fibrillation: Secondary | ICD-10-CM | POA: Diagnosis present

## 2021-04-18 DIAGNOSIS — Z7984 Long term (current) use of oral hypoglycemic drugs: Secondary | ICD-10-CM | POA: Diagnosis not present

## 2021-04-18 DIAGNOSIS — N3090 Cystitis, unspecified without hematuria: Secondary | ICD-10-CM | POA: Diagnosis not present

## 2021-04-18 DIAGNOSIS — T45515A Adverse effect of anticoagulants, initial encounter: Secondary | ICD-10-CM | POA: Diagnosis present

## 2021-04-18 DIAGNOSIS — Z5181 Encounter for therapeutic drug level monitoring: Secondary | ICD-10-CM

## 2021-04-18 DIAGNOSIS — Z79899 Other long term (current) drug therapy: Secondary | ICD-10-CM | POA: Diagnosis not present

## 2021-04-18 DIAGNOSIS — Z7901 Long term (current) use of anticoagulants: Secondary | ICD-10-CM | POA: Diagnosis not present

## 2021-04-18 DIAGNOSIS — Z86718 Personal history of other venous thrombosis and embolism: Secondary | ICD-10-CM

## 2021-04-18 DIAGNOSIS — Z86711 Personal history of pulmonary embolism: Secondary | ICD-10-CM | POA: Diagnosis present

## 2021-04-18 DIAGNOSIS — I82401 Acute embolism and thrombosis of unspecified deep veins of right lower extremity: Secondary | ICD-10-CM | POA: Diagnosis not present

## 2021-04-18 DIAGNOSIS — G40909 Epilepsy, unspecified, not intractable, without status epilepticus: Secondary | ICD-10-CM | POA: Diagnosis not present

## 2021-04-18 DIAGNOSIS — N139 Obstructive and reflux uropathy, unspecified: Secondary | ICD-10-CM

## 2021-04-18 DIAGNOSIS — N261 Atrophy of kidney (terminal): Secondary | ICD-10-CM | POA: Diagnosis present

## 2021-04-18 DIAGNOSIS — I4891 Unspecified atrial fibrillation: Secondary | ICD-10-CM | POA: Diagnosis not present

## 2021-04-18 DIAGNOSIS — N950 Postmenopausal bleeding: Secondary | ICD-10-CM | POA: Diagnosis present

## 2021-04-18 DIAGNOSIS — R634 Abnormal weight loss: Secondary | ICD-10-CM | POA: Diagnosis present

## 2021-04-18 DIAGNOSIS — R590 Localized enlarged lymph nodes: Secondary | ICD-10-CM

## 2021-04-18 DIAGNOSIS — E872 Acidosis, unspecified: Secondary | ICD-10-CM | POA: Diagnosis present

## 2021-04-18 DIAGNOSIS — N852 Hypertrophy of uterus: Secondary | ICD-10-CM | POA: Diagnosis present

## 2021-04-18 DIAGNOSIS — E118 Type 2 diabetes mellitus with unspecified complications: Secondary | ICD-10-CM

## 2021-04-18 DIAGNOSIS — E538 Deficiency of other specified B group vitamins: Secondary | ICD-10-CM | POA: Diagnosis present

## 2021-04-18 DIAGNOSIS — N858 Other specified noninflammatory disorders of uterus: Secondary | ICD-10-CM

## 2021-04-18 DIAGNOSIS — E876 Hypokalemia: Secondary | ICD-10-CM | POA: Diagnosis present

## 2021-04-18 DIAGNOSIS — Z6841 Body Mass Index (BMI) 40.0 and over, adult: Secondary | ICD-10-CM

## 2021-04-18 DIAGNOSIS — D689 Coagulation defect, unspecified: Secondary | ICD-10-CM | POA: Diagnosis present

## 2021-04-18 DIAGNOSIS — D494 Neoplasm of unspecified behavior of bladder: Secondary | ICD-10-CM | POA: Diagnosis not present

## 2021-04-18 LAB — URINALYSIS, ROUTINE W REFLEX MICROSCOPIC
Bacteria, UA: NONE SEEN
Bilirubin Urine: NEGATIVE
Glucose, UA: NEGATIVE mg/dL
Ketones, ur: NEGATIVE mg/dL
Nitrite: NEGATIVE
Protein, ur: 100 mg/dL — AB
Specific Gravity, Urine: 1.009 (ref 1.005–1.030)
WBC, UA: >50 WBC/hpf — ABNORMAL HIGH (ref 0–5)
pH: 6 (ref 5.0–8.0)

## 2021-04-18 LAB — TYPE AND SCREEN
ABO/RH(D): A POS
Antibody Screen: NEGATIVE

## 2021-04-18 LAB — COMPREHENSIVE METABOLIC PANEL
ALT: 28 U/L (ref 0–44)
AST: 44 U/L — ABNORMAL HIGH (ref 15–41)
Albumin: 2.8 g/dL — ABNORMAL LOW (ref 3.5–5.0)
Alkaline Phosphatase: 83 U/L (ref 38–126)
Anion gap: 17 — ABNORMAL HIGH (ref 5–15)
BUN: 108 mg/dL — ABNORMAL HIGH (ref 8–23)
CO2: 15 mmol/L — ABNORMAL LOW (ref 22–32)
Calcium: 8.1 mg/dL — ABNORMAL LOW (ref 8.9–10.3)
Chloride: 107 mmol/L (ref 98–111)
Creatinine, Ser: 14.34 mg/dL — ABNORMAL HIGH (ref 0.44–1.00)
GFR, Estimated: 3 mL/min — ABNORMAL LOW (ref >60)
Glucose, Bld: 120 mg/dL — ABNORMAL HIGH (ref 70–99)
Potassium: 3.8 mmol/L (ref 3.5–5.1)
Sodium: 139 mmol/L (ref 135–145)
Total Bilirubin: 0.2 mg/dL — ABNORMAL LOW (ref 0.3–1.2)
Total Protein: 7.2 g/dL (ref 6.5–8.1)

## 2021-04-18 LAB — PROTIME-INR
INR: 8.5 (ref 0.8–1.2)
Prothrombin Time: 70.3 seconds — ABNORMAL HIGH (ref 11.4–15.2)

## 2021-04-18 LAB — RESP PANEL BY RT-PCR (FLU A&B, COVID) ARPGX2
Influenza A by PCR: NEGATIVE
Influenza B by PCR: NEGATIVE
SARS Coronavirus 2 by RT PCR: NEGATIVE

## 2021-04-18 LAB — CBC WITH DIFFERENTIAL/PLATELET
Abs Immature Granulocytes: 0.08 10*3/uL — ABNORMAL HIGH (ref 0.00–0.07)
Basophils Absolute: 0.1 10*3/uL (ref 0.0–0.1)
Basophils Relative: 0 %
Eosinophils Absolute: 0.1 10*3/uL (ref 0.0–0.5)
Eosinophils Relative: 1 %
HCT: 27 % — ABNORMAL LOW (ref 36.0–46.0)
Hemoglobin: 8.5 g/dL — ABNORMAL LOW (ref 12.0–15.0)
Immature Granulocytes: 1 %
Lymphocytes Relative: 15 %
Lymphs Abs: 1.7 10*3/uL (ref 0.7–4.0)
MCH: 29.1 pg (ref 26.0–34.0)
MCHC: 31.5 g/dL (ref 30.0–36.0)
MCV: 92.5 fL (ref 80.0–100.0)
Monocytes Absolute: 0.9 10*3/uL (ref 0.1–1.0)
Monocytes Relative: 8 %
Neutro Abs: 8.5 10*3/uL — ABNORMAL HIGH (ref 1.7–7.7)
Neutrophils Relative %: 75 %
Platelets: 383 10*3/uL (ref 150–400)
RBC: 2.92 MIL/uL — ABNORMAL LOW (ref 3.87–5.11)
RDW: 14 % (ref 11.5–15.5)
WBC: 11.4 10*3/uL — ABNORMAL HIGH (ref 4.0–10.5)
nRBC: 0 % (ref 0.0–0.2)

## 2021-04-18 LAB — POCT INR: INR: 8 — AB (ref 2.0–3.0)

## 2021-04-18 MED ORDER — ATENOLOL 25 MG PO TABS
50.0000 mg | ORAL_TABLET | Freq: Every day | ORAL | Status: DC
  Administered 2021-04-19 – 2021-04-29 (×11): 50 mg via ORAL
  Filled 2021-04-18 (×11): qty 2

## 2021-04-18 MED ORDER — VITAMIN K1 10 MG/ML IJ SOLN
1.0000 mg | Freq: Once | INTRAVENOUS | Status: AC
  Administered 2021-04-18: 1 mg via INTRAVENOUS
  Filled 2021-04-18: qty 0.1

## 2021-04-18 MED ORDER — VITAMIN K1 10 MG/ML IJ SOLN
INTRAMUSCULAR | Status: AC
  Filled 2021-04-18: qty 1

## 2021-04-18 MED ORDER — INSULIN ASPART 100 UNIT/ML IJ SOLN
0.0000 [IU] | Freq: Three times a day (TID) | INTRAMUSCULAR | Status: DC
  Administered 2021-04-21 – 2021-04-25 (×5): 2 [IU] via SUBCUTANEOUS

## 2021-04-18 MED ORDER — INSULIN ASPART 100 UNIT/ML IJ SOLN: 0.0000 [IU] | Freq: Every day | INTRAMUSCULAR | Status: DC

## 2021-04-18 MED ORDER — HYDRALAZINE HCL 20 MG/ML IJ SOLN: 5.0000 mg | Freq: Four times a day (QID) | INTRAMUSCULAR | Status: DC | PRN

## 2021-04-18 MED ORDER — PHENOBARBITAL 97.2 MG PO TABS
97.2000 mg | ORAL_TABLET | Freq: Two times a day (BID) | ORAL | Status: DC
  Administered 2021-04-18 – 2021-04-29 (×22): 97.2 mg via ORAL
  Filled 2021-04-18 (×13): qty 1
  Filled 2021-04-18: qty 3
  Filled 2021-04-18 (×6): qty 1
  Filled 2021-04-18: qty 3
  Filled 2021-04-18: qty 1

## 2021-04-18 NOTE — ED Provider Notes (Signed)
[facility] EMERGENCY DEPARTMENT Provider Note   CSN: [id number] Arrival date & time: 04/18/21  1622     History History of unprovoked pulmonary embolus on Coumadin, A. fib, Morbid obesity, Seizure disorder  Chief Complaint  Patient presents with   Abnormal Lab     Debbie [name] is a 61 y.o. female who presents due to abnormal lab.  Patient is currently on Coumadin for an unprovoked pulmonary embolus.  She has a history of atrial fibrillation, hypertension.  Patient has had 2 months of vaginal bleeding which is at times very heavy.  She states that sometimes when she stands up she pours blood out of her pelvis.  She was supposed to have a vaginal exam however her PCP said that they could not do it and sending her for transvaginal exam.  She went for her regular INR testing and was noted to have elevated INR was sent for further evaluation.  She complains of pelvic pain at times.  She denies dizziness weakness shortness of breath.  She has no history of renal disease.  She has had unexplained weight loss of greater than 50 pounds in the last 4 months  HPI     Home Medications Prior to Admission medications   Medication Sig Start Date End Date Taking? Authorizing Provider  atenolol (TENORMIN) 50 MG tablet Take 50 mg by mouth daily.    [provider]  cholecalciferol (VITAMIN D3) 25 MCG (1000 UT) tablet Take 1,000 Units by mouth daily.    [provider]  hydrochlorothiazide (MICROZIDE) 12.5 MG capsule Take 12.5 mg by mouth daily.    [provider]  hydrocortisone 2.5 % cream Apply topically 2 (two) times daily as needed. 04/11/21   [provider]  megestrol (MEGACE) 40 MG tablet Take by mouth. 04/12/21   [provider]  metFORMIN (GLUCOPHAGE) 500 MG tablet Take 500 mg by mouth 2 (two) times daily.     [provider]  metFORMIN (GLUCOPHAGE-XR) 500 MG 24 hr tablet Take 500 mg by mouth 2 (two) times daily. 03/30/21   [provider]  nitrofurantoin, macrocrystal-monohydrate, (MACROBID) 100 MG capsule Take 100 mg by mouth daily. 01/16/21   [provider]  oxybutynin (DITROPAN-XL) 10 MG 24 hr tablet Take 10 mg by mouth daily. 04/09/21   [provider]  oxybutynin (DITROPAN-XL) 5 MG 24 hr tablet Take 5 mg by mouth daily. 03/09/21   [provider]  PHENobarbital (LUMINAL) 97.2 MG tablet Take 97.2 mg by mouth 2 (two) times daily.    [provider]  warfarin (COUMADIN) 5 MG tablet TAKE 2-3 TABLETS BY MOUTH ONCE DAILY AS DIRECTED BY COUMADIN CLINIC 01/11/21   Arnoldo Lenis, MD      Allergies    Patient has no known allergies.    Review of Systems   Review of Systems  Constitutional:  Positive for unexpected weight change. Negative for appetite change, chills and fever.  Genitourinary:  Positive for difficulty urinating, pelvic pain and vaginal bleeding.   Physical Exam Updated Vital Signs BP 120/63    Pulse 88    Temp 97.6 F (36.4 C)    Resp (!) 24    Wt (!) 136.5 kg    SpO2 96%    BMI 55.05 kg/m  Physical Exam Vitals and nursing note reviewed.  Constitutional:      General: She is not in acute distress.    Appearance: She is well-developed. She is not diaphoretic.  HENT:  Head: Normocephalic and atraumatic.     Right Ear: External ear normal.     Left Ear: External ear normal.     Nose: Nose normal.     Mouth/Throat:     Mouth: Mucous membranes are moist.  Eyes:     General: No scleral icterus.    Conjunctiva/sclera: Conjunctivae normal.  Cardiovascular:     Rate and Rhythm: Normal rate and regular rhythm.     Heart sounds: Normal heart sounds. No murmur heard.   No friction rub. No gallop.  Pulmonary:     Effort: Pulmonary effort is normal. No respiratory distress.     Breath sounds: Normal breath sounds.  Abdominal:     General: Bowel sounds are normal. There is no distension.     Palpations: Abdomen is soft. There is no mass.     Tenderness:  There is no abdominal tenderness. There is no guarding.  Genitourinary:    Comments: Unable to visualize the cervix due to body habitus and positioning Heavy vaginal bleeding and clotting noted from vagina.  Musculoskeletal:     Cervical back: Normal range of motion.  Skin:    General: Skin is warm and dry.  Neurological:     Mental Status: She is alert and oriented to person, place, and time.  Psychiatric:        Behavior: Behavior normal.    ED Results / Procedures / Treatments   Labs (all labs ordered are listed, but only abnormal results are displayed) Labs Reviewed  CBC WITH DIFFERENTIAL/PLATELET - Abnormal; Notable for the following components:      Result Value   WBC 11.4 (*)    RBC 2.92 (*)    Hemoglobin 8.5 (*)    HCT 27.0 (*)    Neutro Abs 8.5 (*)    Abs Immature Granulocytes 0.08 (*)    All other components within normal limits  COMPREHENSIVE METABOLIC PANEL - Abnormal; Notable for the following components:   CO2 15 (*)    Glucose, Bld 120 (*)    BUN 108 (*)    Creatinine, Ser 14.34 (*)    Calcium 8.1 (*)    Albumin 2.8 (*)    AST 44 (*)    Total Bilirubin 0.2 (*)    GFR, Estimated 3 (*)    Anion gap 17 (*)    All other components within normal limits  PROTIME-INR - Abnormal; Notable for the following components:   Prothrombin Time 70.3 (*)    INR 8.5 (*)    All other components within normal limits  TYPE AND SCREEN    EKG None  Radiology No results found.  Procedures .Critical Care Performed by: Margarita Mail, PA-C Authorized by: Margarita Mail, PA-C   Critical care provider statement:    Critical care time (minutes):  60   Critical care time was exclusive of:  Separately billable procedures and treating other patients   Critical care was necessary to treat or prevent imminent or life-threatening deterioration of the following conditions:  Renal failure   Critical care was time spent personally by me on the following activities:   Development of treatment plan with patient or surrogate, discussions with consultants, evaluation of patient's response to treatment, examination of patient, ordering and review of laboratory studies, ordering and review of radiographic studies, ordering and performing treatments and interventions, pulse oximetry, re-evaluation of patient's condition and review of old charts   Medications Ordered in ED Medications - No data to display  ED Course/ Medical  Decision Making/ A&P Clinical Course as of 04/19/21 1040  Wed Apr 18, 2021  2136 BUN(!): 108 [AH]  2136 Creatinine(!): 14.34 [AH]  2136 Anion gap(!): 17 [AH]    Clinical Course User Index [AH] Margarita Mail, PA-C                           Medical Decision Making  This patient presents to the ED for concern of vaginal bleeding- INR elevated, this involves an extensive number of treatment options, and is a complaint that carries with it a high risk of complications and morbidity.  The differential diagnosis includes most likely due to pelvic cancer, but could include injury, infection, overuse of coumadin   Co morbidities that complicate the patient evaluation  obesity, hx of unprovoked DVT/PE, Afib on anticoagulation with coumadin   Additional history obtained:  Additional history obtained from daughter at bedside External records from outside source obtained and reviewed including op coumadin clinic   Lab Tests:  I Ordered, and personally interpreted labs.  The pertinent results include:   cmp shows New renal failure with CR of 14.34, BUN 108- AGMA Cbc with Hgb of 8.4 about 1 g below normal,Leukocytosis of 11.8 INR of 8.1 UA shows Pyuria without bacteriuria- Cath sample   Imaging Studies ordered:  I ordered imaging studies including CT ab/pelv w/o contrast I independently visualized and interpreted imaging which showed wnlarged uterus- BL hydrouretronephrosis, lymphadenopathy I agree with the radiologist  interpretation   Cardiac Monitoring:  The patient was maintained on a cardiac monitor.  I personally viewed and interpreted the cardiac monitored which showed an underlying rhythm of:  rate controlled AFIB  I independently interpreted the EKG which showed rate controlled Afi at a rate of 84   Medicines ordered and prescription drug management:  I ordered medication including IV vitamin K  for supratherapeutic INR and active vaginal bleeding Reevaluation of the patient after these medicines showed that the patient improved I have reviewed the patients home medicines and have made adjustments as needed   Test Considered:  Transvaginal US- unavailable at this time   Critical Interventions:  vitamin K reversal of INR in the setting of active bleeding   Consultations Obtained:  I requested consultation with Urology (Dr. Alinda Money),  and discussed lab and imaging findings as well as pertinent plan - they recommend: hospitalization at [facility] for IR nephrostomy tube placement- reconsult service when admitted I discussed the Case with Dr. Johnney Ou of nephrology who is aware of the patient  I discussed case with Dr. 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    Final Clinical Impression(s) / ED Diagnoses Final diagnoses:  None    Rx / DC Orders ED Discharge Orders     None         Margarita Mail, PA-C 04/19/21 1058    Milton Ferguson, MD 04/20/21 1148

## 2021-04-18 NOTE — ED Triage Notes (Signed)
Pt arrives after being sent over from [facility] due to her INR being >8. Per pt, she has been having abnormal vaginal bleeding for about month and passing clots. Pt endorses exertional SOB that has gotten worse over the last month.

## 2021-04-18 NOTE — Telephone Encounter (Signed)
Pt called.  States when she went to [facility] ED earlier they only had her go to the lab.  She was not registered in the ED or seen.  Told pt with her INR being so elevated and having vaginal bleeding she needs to go back to ED.  We have decided to have pt go to [facility] ED since she is scheduled for vaginal U/S there tomorrow anyway.  Pt states she has her grandkids right now and will take about 2 hours before she can get to ED.  Called [facility] ED triage and spoke with Whitney.  Gave her report on patient and expected arrival time.
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
## 2021-04-18 NOTE — Progress Notes (Signed)
MEDICATION RELATED CONSULT NOTE - INITIAL   Pharmacy Consult for Phenobarbital Indication: h/o seizures  No Known Allergies  Patient Measurements: Weight: (!) 136.5 kg (301 lb)   Vital Signs: Temp: 98.2 F (36.8 C) (01/04 2115) Temp Source: Oral (01/04 2115) BP: 141/89 (01/04 2115) Pulse Rate: 82 (01/04 2115) Intake/Output from previous day: No intake/output data recorded. Intake/Output from this shift: Total I/O In: 44.3 [IV Piggyback:44.3] Out: -   Labs: Recent Labs    04/18/21 1718  WBC 11.4*  HGB 8.5*  HCT 27.0*  PLT 383  CREATININE 14.34*  ALBUMIN 2.8*  PROT 7.2  AST 44*  ALT 28  ALKPHOS 83  BILITOT 0.2*   Estimated Creatinine Clearance: 5.4 mL/min (A) (by C-G formula based on SCr of 14.34 mg/dL (H)).   Microbiology: Recent Results (from the past 720 hour(s))  Resp Panel by RT-PCR (Flu A&B, Covid) Nasopharyngeal Swab     Status: None   Collection Time: 04/18/21  8:11 PM   Specimen: Nasopharyngeal Swab; Nasopharyngeal(NP) swabs in vial transport medium  Result Value Ref Range Status   SARS Coronavirus 2 by RT PCR NEGATIVE NEGATIVE Final    Comment: (NOTE) SARS-CoV-2 target nucleic acids are NOT DETECTED.  The SARS-CoV-2 RNA is generally detectable in upper respiratory specimens during the acute phase of infection. The lowest concentration of SARS-CoV-2 viral copies this assay can detect is 138 copies/mL. A negative result does not preclude SARS-Cov-2 infection and should not be used as the sole basis for treatment or other patient management decisions. A negative result may occur with  improper specimen collection/handling, submission of specimen other than nasopharyngeal swab, presence of viral mutation(s) within the areas targeted by this assay, and inadequate number of viral copies(<138 copies/mL). A negative result must be combined with clinical observations, patient history, and epidemiological information. The expected result is  Negative.  Fact Sheet for Patients:  EntrepreneurPulse.com.au  Fact Sheet for Healthcare Providers:  IncredibleEmployment.be  This test is no t yet approved or cleared by the [place] FDA and  has been authorized for detection and/or diagnosis of SARS-CoV-2 by FDA under an Emergency Use Authorization (EUA). This EUA will remain  in effect (meaning this test can be used) for the duration of the COVID-19 declaration under Section 564(b)(1) of the Act, 21 U.S.C.section 360bbb-3(b)(1), unless the authorization is terminated  or revoked sooner.       Influenza A by PCR NEGATIVE NEGATIVE Final   Influenza B by PCR NEGATIVE NEGATIVE Final    Comment: (NOTE) The Xpert Xpress SARS-CoV-2/FLU/RSV plus assay is intended as an aid in the diagnosis of influenza from Nasopharyngeal swab specimens and should not be used as a sole basis for treatment. Nasal washings and aspirates are unacceptable for Xpert Xpress SARS-CoV-2/FLU/RSV testing.  Fact Sheet for Patients: EntrepreneurPulse.com.au  Fact Sheet for Healthcare Providers: IncredibleEmployment.be  This test is not yet approved or cleared by the [place] FDA and has been authorized for detection and/or diagnosis of SARS-CoV-2 by FDA under an Emergency Use Authorization (EUA). This EUA will remain in effect (meaning this test can be used) for the duration of the COVID-19 declaration under Section 564(b)(1) of the Act, 21 U.S.C. section 360bbb-3(b)(1), unless the authorization is terminated or revoked.  Performed at [facility], [address], [place], [place] [address]     Medical History: Past Medical History:  Diagnosis Date   Hypertension    Seizures ([place])     Medications:  See electronic med rec  Assessment: 61  y.o. F presents with elevated INR of 8.5. Pt on warfarin PTA for afib. CBC ok on admission. Reversed with vit K 2mg  IV 1/4. Home  dose: 15 mg (5 mg x 3) every Mon, Wed, Fri; 10 mg (5 mg x 2) all other days  Pt on phenobarbital PTA for h/o seizures. Pt admitted with AKI (Scr up to 14. SCr was 0.85 ~2 yrs ago.   Goal of Therapy:  Phenobarbital level 10-30 mcg/ml  Plan:  MD continued home phenobarbital 97.2 mg BID and asked pharmacy to help with dosing. Will obtain phenobarbital level  Sherlon Handing, PharmD, BCPS Please see amion for complete clinical pharmacist phone list 04/18/2021,10:52 PM

## 2021-04-18 NOTE — Patient Instructions (Signed)
POC >8.0  Sent to [facility] lab  PT >170.9  INR: Unable to calculate Pt states she was going to [facility] ED today anyway due to vaginal issues and discomfort.  Told pt to HOLD warfarin till further notice and go directly to ED for evaluation.  She verbalized understanding and agreement with plan.  Will follow.

## 2021-04-18 NOTE — ED Provider Triage Note (Signed)
Emergency Medicine Provider Triage Evaluation Note  Debbie [name] , a 61 y.o. female  was evaluated in triage.  Pt complains of persistent vaginal bleeding with passing large blood clots x1 month.  Takes Coumadin daily secondary to atrial fibrillation and acute PE.  She was seen at the Coumadin clinic earlier today and found to have elevated INR that was greater than 8.  She was sent to [facility] and had repeat blood test.  She returned home and was later contacted and advised to return to the emergency department for further evaluation of her elevated INR.  Patient also reports generalized pain and bloating sensation of her abdomen.  She is scheduled to see OB/GYN on January 16.  She was seen by PCP 1 week ago and started on Megace without improvement of her vaginal bleeding.  Review of Systems  Positive: Vaginal bleeding, abdominal pain, elevated INR Negative: Chest pain, shortness of breath, fever and vomiting  Physical Exam  BP (!) 139/54 (BP Location: Right Arm)    Pulse 100    Temp 97.6 F (36.4 C)    Resp (!) 23    Wt (!) 136.5 kg    SpO2 98%    BMI 55.05 kg/m  Gen:   Awake, no distress   Resp:  Increased work of breathing, MSK:   Peripheral edema bilaterally Other:  Patient obese.  Abdomen soft, no guarding or rebound.  Medical Decision Making  Medically screening exam initiated at 4:56 PM.  Appropriate orders placed.  Debbie [name] was informed that the remainder of the evaluation will be completed by another provider, this initial triage assessment does not replace that evaluation, and the importance of remaining in the ED until their evaluation is complete.  Patient here for further evaluation of INR of greater than 8 in setting of anticoagulation with Coumadin for history of atrial fibrillation and pulmonary embolism.  Patient noting abdominal pain and vaginal bleeding with passage of large blood clots x1 month.  She will need further evaluation in the emergency department for  acute blood loss.  Patient is agreeable to plan.     Kem Parkinson, PA-C 04/18/21 1721

## 2021-04-18 NOTE — ED Notes (Signed)
Date and time results received: 04/18/21 1854  Test: INR Critical Value: 8.5  Name of Provider Notified: Milton Ferguson, MD  Orders Received? Or Actions Taken?: See new orders

## 2021-04-19 ENCOUNTER — Ambulatory Visit (HOSPITAL_COMMUNITY): Payer: Medicaid Other

## 2021-04-19 ENCOUNTER — Encounter (HOSPITAL_COMMUNITY): Payer: Self-pay | Admitting: Internal Medicine

## 2021-04-19 ENCOUNTER — Inpatient Hospital Stay (HOSPITAL_COMMUNITY): Payer: Medicaid Other

## 2021-04-19 ENCOUNTER — Inpatient Hospital Stay (HOSPITAL_COMMUNITY): Payer: Medicaid Other | Admitting: Certified Registered Nurse Anesthetist

## 2021-04-19 ENCOUNTER — Encounter (HOSPITAL_COMMUNITY)
Admission: EM | Disposition: A | Discharge status: Home / Self Care | Payer: Self-pay | Source: Home / Self Care | Attending: Internal Medicine

## 2021-04-19 ENCOUNTER — Encounter (HOSPITAL_COMMUNITY): Payer: Self-pay

## 2021-04-19 DIAGNOSIS — N179 Acute kidney failure, unspecified: Secondary | ICD-10-CM

## 2021-04-19 DIAGNOSIS — N1330 Unspecified hydronephrosis: Secondary | ICD-10-CM

## 2021-04-19 DIAGNOSIS — D494 Neoplasm of unspecified behavior of bladder: Secondary | ICD-10-CM

## 2021-04-19 DIAGNOSIS — N3090 Cystitis, unspecified without hematuria: Secondary | ICD-10-CM

## 2021-04-19 HISTORY — PX: FULGURATION OF BLADDER TUMOR: SHX6261

## 2021-04-19 HISTORY — PX: CYSTOSCOPY W/ URETERAL STENT PLACEMENT: SHX1429

## 2021-04-19 LAB — BASIC METABOLIC PANEL
Anion gap: 17 — ABNORMAL HIGH (ref 5–15)
BUN: 106 mg/dL — ABNORMAL HIGH (ref 8–23)
CO2: 13 mmol/L — ABNORMAL LOW (ref 22–32)
Calcium: 8 mg/dL — ABNORMAL LOW (ref 8.9–10.3)
Chloride: 107 mmol/L (ref 98–111)
Creatinine, Ser: 14.74 mg/dL — ABNORMAL HIGH (ref 0.44–1.00)
GFR, Estimated: 3 mL/min — ABNORMAL LOW (ref >60)
Glucose, Bld: 93 mg/dL (ref 70–99)
Potassium: 3.7 mmol/L (ref 3.5–5.1)
Sodium: 137 mmol/L (ref 135–145)

## 2021-04-19 LAB — CBG MONITORING, ED
Glucose-Capillary: 74 mg/dL (ref 70–99)
Glucose-Capillary: 81 mg/dL (ref 70–99)

## 2021-04-19 LAB — CBC
HCT: 26 % — ABNORMAL LOW (ref 36.0–46.0)
Hemoglobin: 8.4 g/dL — ABNORMAL LOW (ref 12.0–15.0)
MCH: 30 pg (ref 26.0–34.0)
MCHC: 32.3 g/dL (ref 30.0–36.0)
MCV: 92.9 fL (ref 80.0–100.0)
Platelets: 360 10*3/uL (ref 150–400)
RBC: 2.8 MIL/uL — ABNORMAL LOW (ref 3.87–5.11)
RDW: 14 % (ref 11.5–15.5)
WBC: 11.8 10*3/uL — ABNORMAL HIGH (ref 4.0–10.5)
nRBC: 0 % (ref 0.0–0.2)

## 2021-04-19 LAB — PROTIME-INR
INR: 4.1 (ref 0.8–1.2)
INR: 2.7 — ABNORMAL HIGH (ref 0.8–1.2)
Prothrombin Time: 40 seconds — ABNORMAL HIGH (ref 11.4–15.2)
Prothrombin Time: 29 seconds — ABNORMAL HIGH (ref 11.4–15.2)

## 2021-04-19 LAB — HIV ANTIBODY (ROUTINE TESTING W REFLEX): HIV Screen 4th Generation wRfx: NONREACTIVE

## 2021-04-19 LAB — HEMOGLOBIN A1C
Hgb A1c MFr Bld: 5.7 % — ABNORMAL HIGH (ref 4.8–5.6)
Mean Plasma Glucose: 116.89 mg/dL

## 2021-04-19 LAB — GLUCOSE, CAPILLARY
Glucose-Capillary: 85 mg/dL (ref 70–99)
Glucose-Capillary: 111 mg/dL — ABNORMAL HIGH (ref 70–99)

## 2021-04-19 LAB — PHENOBARBITAL LEVEL: Phenobarbital: 30.7 ug/mL — ABNORMAL HIGH (ref 15.0–30.0)

## 2021-04-19 SURGERY — CYSTOSCOPY, WITH RETROGRADE PYELOGRAM AND URETERAL STENT INSERTION
Anesthesia: General | Site: Ureter

## 2021-04-19 MED ORDER — PROPOFOL 10 MG/ML IV BOLUS
INTRAVENOUS | Status: DC | PRN
Start: 2021-04-19 — End: 2021-04-19
  Administered 2021-04-19: 40 mg via INTRAVENOUS
  Administered 2021-04-19: 160 mg via INTRAVENOUS

## 2021-04-19 MED ORDER — ONDANSETRON HCL 4 MG/2ML IJ SOLN
INTRAMUSCULAR | Status: AC
  Filled 2021-04-19: qty 4

## 2021-04-19 MED ORDER — LACTATED RINGERS IV SOLN: INTRAVENOUS | Status: DC

## 2021-04-19 MED ORDER — ORAL CARE MOUTH RINSE: 15.0000 mL | Freq: Once | OROMUCOSAL | Status: DC

## 2021-04-19 MED ORDER — IPRATROPIUM-ALBUTEROL 0.5-2.5 (3) MG/3ML IN SOLN
3.0000 mL | Freq: Once | RESPIRATORY_TRACT | Status: AC
  Administered 2021-04-19: 3 mL via RESPIRATORY_TRACT

## 2021-04-19 MED ORDER — DIATRIZOATE MEGLUMINE 30 % UR SOLN
URETHRAL | Status: AC
  Filled 2021-04-19: qty 100

## 2021-04-19 MED ORDER — EPHEDRINE SULFATE 50 MG/ML IJ SOLN
INTRAMUSCULAR | Status: DC | PRN
  Administered 2021-04-19: 10 mg via INTRAVENOUS

## 2021-04-19 MED ORDER — LIDOCAINE HCL (PF) 2 % IJ SOLN
INTRAMUSCULAR | Status: AC
  Filled 2021-04-19: qty 10

## 2021-04-19 MED ORDER — SUCCINYLCHOLINE CHLORIDE 200 MG/10ML IV SOSY
PREFILLED_SYRINGE | INTRAVENOUS | Status: DC | PRN
  Administered 2021-04-19: 100 mg via INTRAVENOUS

## 2021-04-19 MED ORDER — FENTANYL CITRATE (PF) 100 MCG/2ML IJ SOLN
INTRAMUSCULAR | Status: DC | PRN
  Administered 2021-04-19 (×2): 50 ug via INTRAVENOUS

## 2021-04-19 MED ORDER — LIDOCAINE HCL (CARDIAC) PF 50 MG/5ML IV SOSY
PREFILLED_SYRINGE | INTRAVENOUS | Status: DC | PRN
  Administered 2021-04-19: 100 mg via INTRAVENOUS

## 2021-04-19 MED ORDER — FENTANYL CITRATE (PF) 100 MCG/2ML IJ SOLN
INTRAMUSCULAR | Status: AC
  Filled 2021-04-19: qty 2

## 2021-04-19 MED ORDER — ROCURONIUM BROMIDE 100 MG/10ML IV SOLN
INTRAVENOUS | Status: DC | PRN
  Administered 2021-04-19: 30 mg via INTRAVENOUS

## 2021-04-19 MED ORDER — CHLORHEXIDINE GLUCONATE 0.12 % MT SOLN: 15.0000 mL | Freq: Once | OROMUCOSAL | Status: DC

## 2021-04-19 MED ORDER — PHENYLEPHRINE 40 MCG/ML (10ML) SYRINGE FOR IV PUSH (FOR BLOOD PRESSURE SUPPORT)
PREFILLED_SYRINGE | INTRAVENOUS | Status: AC
  Filled 2021-04-19: qty 10

## 2021-04-19 MED ORDER — SUCCINYLCHOLINE CHLORIDE 200 MG/10ML IV SOSY
PREFILLED_SYRINGE | INTRAVENOUS | Status: AC
  Filled 2021-04-19: qty 10

## 2021-04-19 MED ORDER — CEFAZOLIN IN SODIUM CHLORIDE 3-0.9 GM/100ML-% IV SOLN
3.0000 g | Freq: Once | INTRAVENOUS | Status: AC
  Administered 2021-04-19: 3 g via INTRAVENOUS
  Filled 2021-04-19: qty 100

## 2021-04-19 MED ORDER — PROPOFOL 10 MG/ML IV BOLUS
INTRAVENOUS | Status: AC
  Filled 2021-04-19: qty 20

## 2021-04-19 MED ORDER — SODIUM CHLORIDE 0.9 % IR SOLN
Status: DC | PRN
  Administered 2021-04-19 (×2): 3000 mL

## 2021-04-19 MED ORDER — IPRATROPIUM-ALBUTEROL 0.5-2.5 (3) MG/3ML IN SOLN
RESPIRATORY_TRACT | Status: AC
  Filled 2021-04-19: qty 3

## 2021-04-19 MED ORDER — ONDANSETRON HCL 4 MG/2ML IJ SOLN
INTRAMUSCULAR | Status: DC | PRN
Start: 2021-04-19 — End: 2021-04-19
  Administered 2021-04-19: 4 mg via INTRAVENOUS

## 2021-04-19 MED ORDER — STERILE WATER FOR IRRIGATION IR SOLN
Status: DC | PRN
  Administered 2021-04-19: 3000 mL

## 2021-04-19 MED ORDER — SUGAMMADEX SODIUM 500 MG/5ML IV SOLN
INTRAVENOUS | Status: DC | PRN
  Administered 2021-04-19: 200 mg via INTRAVENOUS

## 2021-04-19 MED ORDER — PHENYLEPHRINE HCL (PRESSORS) 10 MG/ML IV SOLN
INTRAVENOUS | Status: DC | PRN
  Administered 2021-04-19: 40 ug via INTRAVENOUS
  Administered 2021-04-19: 120 ug via INTRAVENOUS
  Administered 2021-04-19 (×2): 40 ug via INTRAVENOUS

## 2021-04-19 MED ORDER — EPHEDRINE 5 MG/ML INJ
INTRAVENOUS | Status: AC
  Filled 2021-04-19: qty 5

## 2021-04-19 MED ORDER — VITAMIN K1 10 MG/ML IJ SOLN
2.0000 mg | Freq: Once | INTRAVENOUS | Status: AC
  Administered 2021-04-19: 2 mg via INTRAVENOUS
  Filled 2021-04-19 (×2): qty 0.2

## 2021-04-19 MED ORDER — STERILE WATER FOR IRRIGATION IR SOLN
Status: DC | PRN
  Administered 2021-04-19: 1000 mL

## 2021-04-19 SURGICAL SUPPLY — 22 items
BAG DRAIN URO TABLE W/ADPT NS (BAG) ×3 IMPLANT
BAG DRN 8 ADPR NS SKTRN CSTL (BAG) ×2
BAG HAMPER (MISCELLANEOUS) ×3 IMPLANT
CATH INTERMIT  6FR 70CM (CATHETERS) ×3 IMPLANT
CLOTH BEACON ORANGE TIMEOUT ST (SAFETY) ×3 IMPLANT
GLOVE SURG POLYISO LF SZ8 (GLOVE) ×3 IMPLANT
GLOVE SURG UNDER POLY LF SZ7 (GLOVE) ×6 IMPLANT
GOWN STRL REUS W/TWL LRG LVL3 (GOWN DISPOSABLE) ×3 IMPLANT
GOWN STRL REUS W/TWL XL LVL3 (GOWN DISPOSABLE) ×3 IMPLANT
GUIDEWIRE STR ZIPWIRE 035X150 (MISCELLANEOUS) ×3 IMPLANT
IV NS IRRIG 3000ML ARTHROMATIC (IV SOLUTION) ×3 IMPLANT
KIT TURNOVER CYSTO (KITS) ×3 IMPLANT
MANIFOLD NEPTUNE II (INSTRUMENTS) ×3 IMPLANT
PACK CYSTO (CUSTOM PROCEDURE TRAY) ×3 IMPLANT
PAD ARMBOARD 7.5X6 YLW CONV (MISCELLANEOUS) ×3 IMPLANT
PAD TELFA 3X4 1S STER (GAUZE/BANDAGES/DRESSINGS) ×1 IMPLANT
SYR 10ML LL (SYRINGE) ×3 IMPLANT
TOWEL OR 17X26 4PK STRL BLUE (TOWEL DISPOSABLE) ×3 IMPLANT
TRAY FOLEY W/BAG SLVR 16FR (SET/KITS/TRAYS/PACK) ×3
TRAY FOLEY W/BAG SLVR 16FR ST (SET/KITS/TRAYS/PACK) IMPLANT
WATER STERILE IRR 3000ML UROMA (IV SOLUTION) ×1 IMPLANT
WATER STERILE IRR 500ML POUR (IV SOLUTION) ×3 IMPLANT
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
## 2021-04-19 NOTE — Anesthesia Preprocedure Evaluation (Addendum)
Anesthesia Evaluation  Patient identified by MRN, date of birth, ID band Patient awake    Reviewed: Allergy & Precautions, H&P , NPO status , Patient's Chart, lab work & pertinent test results, reviewed documented beta blocker date and time Preop documentation limited or incomplete due to emergent nature of procedure.  Airway Mallampati: II  TM Distance: >3 FB Neck ROM: full    Dental no notable dental hx.    Pulmonary PE   Pulmonary exam normal breath sounds clear to auscultation       Cardiovascular Exercise Tolerance: Good hypertension, negative cardio ROS   Rhythm:irregular Rate:Normal     Neuro/Psych Seizures -, Well Controlled,  negative psych ROS   GI/Hepatic negative GI ROS, Neg liver ROS,   Endo/Other  Morbid obesity  Renal/GU ARFRenal disease  negative genitourinary   Musculoskeletal   Abdominal   Peds  Hematology negative hematology ROS (+)   Anesthesia Other Findings Ate eggs at 9am.  Patient in severe acute renal failure.  Benefits of proceeding outweigh risks of aspiration.  Will proceed with RSI, ETT.    Reproductive/Obstetrics negative OB ROS                            Anesthesia Physical Anesthesia Plan  ASA: 4 and emergent  Anesthesia Plan: General, General ETT and Rapid Sequence   Post-op Pain Management:    Induction:   PONV Risk Score and Plan: Ondansetron  Airway Management Planned:   Additional Equipment:   Intra-op Plan:   Post-operative Plan:   Informed Consent: I have reviewed the patients History and Physical, chart, labs and discussed the procedure including the risks, benefits and alternatives for the proposed anesthesia with the patient or authorized representative who has indicated his/her understanding and acceptance.     Dental Advisory Given  Plan Discussed with: CRNA  Anesthesia Plan Comments:         Anesthesia Quick  Evaluation

## 2021-04-19 NOTE — Op Note (Signed)
Preoperative diagnosis: bilateral hydronephrosis  Postoperative diagnosis: bilateral hydronephrosis, bladder tumor  Procedure: 1 cystoscopy 2.  Bladder biopsy with fulgeration  Attending: Nicolette Bang  Anesthesia: General  Estimated blood loss: Minimal  Drains: 16 French foley  Specimens:  Bladder biopsies x 3  Antibiotics: ancef  Findings: infiltrative bladder mass involving the trigone. Ureteral orifice unable to be identified.   Indications: Patient is a 61 year old female with bilateral hydronephrosis and acute renal failure.  After discussing treatment options, they decided proceed with bladder biopsy and selective cytologies.  Procedure in detail: The patient was brought to the operating room and a brief timeout was done to ensure correct patient, correct procedure, correct site.  General anesthesia was administered patient was placed in dorsal lithotomy position.  Their genitalia was then prepped and draped in usual sterile fashion.  A rigid 46 French cystoscope was passed in the urethra and the bladder.  Bladder was inspected and we noted a sessile infiltrative bladder mass involving the entire trigone. The ureteral orifices were unable to be identified. We attempted to probe with a ureteral catheter and zipwire to locate the ureteral orifices which was unsuccessful.  Using the biopsy forceps 3 bladder lesion biopsies were obtained. . Hemostasis was then obtained with a bugbee. the bladder was then drained, a 16 French foley was placed and this concluded the procedure which was well tolerated by patient.  Complications: None  Condition: Stable, extubated, transferred to PACU  Plan: Patient will be admitted and will need to be scheduled for bilateral nephrostomy tube placement.
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
## 2021-04-19 NOTE — Anesthesia Postprocedure Evaluation (Signed)
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   Last Vitals:  Vitals:   04/19/21 1318 04/19/21 1330  BP: 102/60 103/72  Pulse:  95  Resp: 19 (!) 21  Temp:    SpO2: 100% 100%    Last Pain:  Vitals:   04/19/21 1315  TempSrc:   PainSc: [place]

## 2021-04-19 NOTE — Anesthesia Procedure Notes (Signed)
Procedure Name: Intubation Date/Time: 04/19/2021 12:23 PM Performed by: Vista Deck, CRNA Pre-anesthesia Checklist: Patient identified, Patient being monitored, Timeout performed, Emergency Drugs available and Suction available Patient Re-evaluated:Patient Re-evaluated prior to induction Oxygen Delivery Method: Circle system utilized Preoxygenation: Pre-oxygenation with 100% oxygen Induction Type: IV induction, Rapid sequence and Cricoid Pressure applied Laryngoscope Size: Mac and 3 Grade View: Grade I Tube type: Oral Tube size: 7.0 mm Number of attempts: 1 Airway Equipment and Method: Stylet Placement Confirmation: ETT inserted through vocal cords under direct vision, positive ETCO2 and breath sounds checked- equal and bilateral Secured at: 21 cm Tube secured with: Tape Dental Injury: Teeth and Oropharynx as per pre-operative assessment

## 2021-04-19 NOTE — Transfer of Care (Signed)
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  Last Pain:  Vitals:   04/19/21 1154  TempSrc: Oral  PainSc: 0-No pain         Complications: No notable events documented.

## 2021-04-19 NOTE — Progress Notes (Signed)
Progress Note    Debbie [name]  ZOX:[id number] DOB: [birth year]  DOA: 04/18/2021 PCP: [facility] [place]      Brief Narrative:    Medical records reviewed and are as summarized below:  Debbie [name] is a 61 y.o. female with medical history significant for seizure disorder, pulmonary embolism on warfarin, atrial fibrillation, hypertension, who was referred by the warfarin clinic to the emergency room for elevated INR.  The patient complained of a 17-month history of vaginal bleeding.  She was given megestrol prescription about 5 days prior to admission.  She also complained of generalized weakness, fatigue shortness of breath and 50 pound weight loss over a 62-month period.      Assessment/Plan:   Principal Problem:   AKI (acute kidney injury) ([place]) Active Problems:   Acute pulmonary embolism ([place])   Acute deep vein thrombosis (DVT) of right lower extremity (HCC)   Unspecified atrial fibrillation (HCC)   HTN (hypertension)   Seizure disorder (HCC)    Body mass index is 55.05 kg/m.  (Morbid obesity)  AKI, obstructive uropathy, bilateral hydronephrosis: Foley catheter is in place.  S/p bladder biopsy with fulguration today. Plan to transfer to [facility] today for nephrostomy. Case discussed with Dr. Rica Records and he said no indication for IV fluids at this time  Coumadin coagulopathy: INR was 8.5 on admission.  S/p IV vitamin K.  INR is down to 4.1.  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: Follow-up with gynecologist for further management  History of DVT/PE, paroxysmal atrial fibrillation: Coumadin on hold because of elevated INR  Hypertension: Continue atenolol  Type II DM: NovoLog as needed for hyperglycemia  Seizure disorder: Continue phenobarbital  Diet Order             Diet NPO time specified  Diet effective now                       Consultants: Nephrologist Urologist  Procedures: Bladder biopsy with fulguration    Medications:    atenolol  50 mg Oral Daily   insulin aspart  0-15 Units Subcutaneous TID WC   insulin aspart  0-5 Units Subcutaneous QHS   PHENobarbital  97.2 mg Oral BID   Continuous Infusions:   ceFAZolin (ANCEF) IV       Anti-infectives (From admission, onward)    Start     Dose/Rate Route Frequency Ordered Stop   04/19/21 1145  ceFAZolin (ANCEF) IVPB 2g/100 mL premix        2 g 200 mL/hr over 30 Minutes Intravenous  Once 04/19/21 1135                Family Communication/Anticipated D/C date and plan/Code Status   DVT prophylaxis: Place and maintain sequential compression device Start: 04/19/21 1135     Code Status: Full Code  Family Communication: None Disposition Plan: Transfer to [facility] for further management   Status is: Inpatient  Remains inpatient appropriate because: AKI           Subjective:   Interval events noted.  No vaginal bleeding, dizziness, shortness of breath or chest pain  Objective:    Vitals:   04/19/21 0113 04/19/21 0414 04/19/21 0456 04/19/21 1030  BP: 137/87 (!) 134/94 123/67 (!) 150/88  Pulse: 77 86 88 91  Resp: (!) 25 18 18  (!) 22  Temp:  TempSrc:      SpO2: 96% 100% 94% 99%  Weight:       Orthostatic VS for the past 24 hrs:  BP- Lying Pulse- Lying BP- Sitting Pulse- Sitting BP- Standing at 0 minutes Pulse- Standing at 0 minutes  04/18/21 2122 141/89 89 (!) 145/92 99 (!) 155/101 96     Intake/Output Summary (Last 24 hours) at 04/19/2021 1146 Last data filed at 04/19/2021 0039 Gross per 24 hour  Intake 95.55 ml  Output --  Net 95.55 ml   Filed Weights   04/18/21 1646  Weight: (!) 136.5 kg    Exam:  GEN: NAD SKIN: Warm and dry EYES: No pallor or icterus ENT: MMM CV: RRR PULM: CTA B ABD: soft, obese, NT, +BS CNS: AAO x 3, non focal EXT: No edema or tenderness        Data  Reviewed:   I have personally reviewed following labs and imaging studies:  Labs: Labs show the following:   Basic Metabolic Panel: Recent Labs  Lab 04/18/21 1718 04/19/21 0359  NA 139 137  K 3.8 3.7  CL 107 107  CO2 15* 13*  GLUCOSE 120* 93  BUN 108* 106*  CREATININE 14.34* 14.74*  CALCIUM 8.1* 8.0*   GFR Estimated Creatinine Clearance: 5.3 mL/min (A) (by C-G formula based on SCr of 14.74 mg/dL (H)). Liver Function Tests: Recent Labs  Lab 04/18/21 1718  AST 44*  ALT 28  ALKPHOS 83  BILITOT 0.2*  PROT 7.2  ALBUMIN 2.8*   No results for input(s): LIPASE, AMYLASE in the last 168 hours. No results for input(s): AMMONIA in the last 168 hours. Coagulation profile Recent Labs  Lab 04/18/21 0842 04/18/21 1718 04/19/21 0359  INR 8.0* 8.5* 4.1*    CBC: Recent Labs  Lab 04/18/21 1718 04/19/21 0359  WBC 11.4* 11.8*  NEUTROABS 8.5*  --   HGB 8.5* 8.4*  HCT 27.0* 26.0*  MCV 92.5 92.9  PLT 383 360   Cardiac Enzymes: No results for input(s): CKTOTAL, CKMB, CKMBINDEX, TROPONINI in the last 168 hours. BNP (last 3 results) No results for input(s): PROBNP in the last 8760 hours. CBG: Recent Labs  Lab 04/19/21 0001 04/19/21 0907  GLUCAP 81 74   D-Dimer: No results for input(s): DDIMER in the last 72 hours. Hgb A1c: Recent Labs    04/19/21 0359  HGBA1C 5.7*   Lipid Profile: No results for input(s): CHOL, HDL, LDLCALC, TRIG, CHOLHDL, LDLDIRECT in the last 72 hours. Thyroid function studies: No results for input(s): TSH, T4TOTAL, T3FREE, THYROIDAB in the last 72 hours.  Invalid input(s): FREET3 Anemia work up: No results for input(s): VITAMINB12, FOLATE, FERRITIN, TIBC, IRON, RETICCTPCT in the last 72 hours. Sepsis Labs: Recent Labs  Lab 04/18/21 1718 04/19/21 0359  WBC 11.4* 11.8*    Microbiology Recent Results (from the past 240 hour(s))  Resp Panel by RT-PCR (Flu A&B, Covid) Nasopharyngeal Swab     Status: None   Collection Time: 04/18/21   8:11 PM   Specimen: Nasopharyngeal Swab; Nasopharyngeal(NP) swabs in vial transport medium  Result Value Ref Range Status   SARS Coronavirus 2 by RT PCR NEGATIVE NEGATIVE Final    Comment: (NOTE) SARS-CoV-2 target nucleic acids are NOT DETECTED.  The SARS-CoV-2 RNA is generally detectable in upper respiratory specimens during the acute phase of infection. The lowest concentration of SARS-CoV-2 viral copies this assay can detect is 138 copies/mL. A negative result does not preclude SARS-Cov-2 infection and should not be used as the sole  basis for treatment or other patient management decisions. A negative result may occur with  improper specimen collection/handling, submission of specimen other than nasopharyngeal swab, presence of viral mutation(s) within the areas targeted by this assay, and inadequate number of viral copies(<138 copies/mL). A negative result must be combined with clinical observations, patient history, and epidemiological information. The expected result is Negative.  Fact Sheet for Patients:  EntrepreneurPulse.com.au  Fact Sheet for Healthcare Providers:  IncredibleEmployment.be  This test is no t yet approved or cleared by the [place] FDA and  has been authorized for detection and/or diagnosis of SARS-CoV-2 by FDA under an Emergency Use Authorization (EUA). This EUA will remain  in effect (meaning this test can be used) for the duration of the COVID-19 declaration under Section 564(b)(1) of the Act, 21 U.S.C.section 360bbb-3(b)(1), unless the authorization is terminated  or revoked sooner.       Influenza A by PCR NEGATIVE NEGATIVE Final   Influenza B by PCR NEGATIVE NEGATIVE Final    Comment: (NOTE) The Xpert Xpress SARS-CoV-2/FLU/RSV plus assay is intended as an aid in the diagnosis of influenza from Nasopharyngeal swab specimens and should not be used as a sole basis for treatment. Nasal washings and aspirates  are unacceptable for Xpert Xpress SARS-CoV-2/FLU/RSV testing.  Fact Sheet for Patients: EntrepreneurPulse.com.au  Fact Sheet for Healthcare Providers: IncredibleEmployment.be  This test is not yet approved or cleared by the [place] FDA and has been authorized for detection and/or diagnosis of SARS-CoV-2 by FDA under an Emergency Use Authorization (EUA). This EUA will remain in effect (meaning this test can be used) for the duration of the COVID-19 declaration under Section 564(b)(1) of the Act, 21 U.S.C. section 360bbb-3(b)(1), unless the authorization is terminated or revoked.  Performed at [facility], [address], [place], [place] [address]     Procedures and diagnostic studies:  CT ABDOMEN PELVIS WO CONTRAST  Result Date: 04/18/2021 CLINICAL DATA:  Generalized abdominal pain and bloating for 1 month, vaginal bleeding EXAM: CT ABDOMEN AND PELVIS WITHOUT CONTRAST TECHNIQUE: Multidetector CT imaging of the abdomen and pelvis was performed following the standard protocol without IV contrast. COMPARISON:  None. FINDINGS: Lower chest: No acute pleural or parenchymal lung disease. Hepatobiliary: High attenuation material dependently within the gallbladder may reflect a noncalcified gallstone or biliary sludge. No evidence of acute cholecystitis. Unenhanced imaging of the liver is unremarkable. Pancreas: Unremarkable unenhanced appearance. Spleen: Unremarkable unenhanced appearance. Adrenals/Urinary Tract: Bilateral hydronephrosis and hydroureter, without clear cause identified for obstruction. No ureteral or bladder calculi. The bladder is decompressed with a Foley catheter. Calcifications at the renal hila are likely vascular. Simple appearing cyst lower pole left kidney. The adrenals are unremarkable. Stomach/Bowel: No bowel obstruction or ileus. Normal appendix right lower quadrant. No bowel wall thickening or inflammatory change.  Vascular/Lymphatic: Mild diffuse atherosclerosis of the aorta and its branches. Evaluation of the vascular lumen is limited without IV contrast. There are multiple enlarged retroperitoneal lymph nodes. Index lymph node in the left para-aortic region image 38/2 measures 14 mm in short axis. Left external iliac chain lymph node demonstrates increased density, measuring 17 mm reference image 66/2. Reproductive: The uterus is enlarged, with minimal high attenuation within the endometrial cavity which could reflect calcification or blood products. If further evaluation of the uterus is desired, pelvic ultrasound could be performed. There are no adnexal masses. Other: No free intra-abdominal fluid or free gas. No abdominal wall hernia. Musculoskeletal: Symmetrical sclerosis along the sacroiliac joints. Changes of osteitis pubis. No  acute displaced fracture. Reconstructed images demonstrate no additional findings. IMPRESSION: 1. Bilateral hydronephrosis and hydroureter, without clear cause for obstruction. No urinary tract calculi. 2. Enlarged uterus, with minimal high attenuation within the endometrial cavity, which could reflect blood products. Given clinical history of vaginal bleeding, follow-up pelvic ultrasound may be useful. 3. Retroperitoneal lymphadenopathy. 4. High attenuation material dependently within the gallbladder, consistent with noncalcified gallstones or sludge. No evidence of acute cholecystitis. 5.  Aortic Atherosclerosis (ICD10-I70.0). Electronically Signed   By: Randa Ngo M.D.   On: 04/18/2021 21:06   US PELVIC COMPLETE WITH TRANSVAGINAL  Result Date: 04/19/2021 CLINICAL DATA:  Uterine mass?  , History of vaginal bleeding. EXAM: TRANSABDOMINAL AND TRANSVAGINAL ULTRASOUND OF PELVIS TECHNIQUE: Both transabdominal and transvaginal ultrasound examinations of the pelvis were performed. Transabdominal technique was performed for global imaging of the pelvis including uterus, ovaries, adnexal  regions, and pelvic cul-de-sac. It was necessary to proceed with endovaginal exam following the transabdominal exam to visualize the endometrium. COMPARISON:  CT examination dated April 18, 2021 FINDINGS: Uterus Measurements: 13.0 x 5.3 x 7.1 = volume: 238 mL. No fibroids or other mass visualized. Evaluation is however limited due to body habitus. Patient was not able to tolerate transvaginal examination. Endometrium Thickness: 9.7 mm.  No focal abnormality visualized. Right ovary Not visualized Left ovary Not visualized Other findings No abnormal free fluid. IMPRESSION: Heterogeneous uterus without appreciable mass. Prominent endometrium in this post menopausal woman. Further evaluation with MRI examination or direct visualization is recommended. Bilateral ovaries were not seen. Exam is limited due to patient's inability to tolerate transvaginal examination. Electronically Signed   By: Keane Police D.O.   On: 04/19/2021 11:06               LOS: 1 day   Anjela Cassara  [facility]   Pager on [email or web address]. If 7PM-7AM, please contact night-coverage at [email or web address]     04/19/2021, 11:46 AM

## 2021-04-20 ENCOUNTER — Encounter (HOSPITAL_COMMUNITY): Payer: Self-pay | Admitting: Urology

## 2021-04-20 ENCOUNTER — Other Ambulatory Visit: Payer: Medicaid Other | Admitting: Radiology

## 2021-04-20 DIAGNOSIS — N179 Acute kidney failure, unspecified: Secondary | ICD-10-CM | POA: Diagnosis not present

## 2021-04-20 LAB — PROTIME-INR
INR: 2.4 — ABNORMAL HIGH (ref 0.8–1.2)
INR: 1.8 — ABNORMAL HIGH (ref 0.8–1.2)
Prothrombin Time: 26.5 seconds — ABNORMAL HIGH (ref 11.4–15.2)
Prothrombin Time: 21 seconds — ABNORMAL HIGH (ref 11.4–15.2)

## 2021-04-20 LAB — BASIC METABOLIC PANEL
Anion gap: 15 (ref 5–15)
BUN: 106 mg/dL — ABNORMAL HIGH (ref 8–23)
CO2: 15 mmol/L — ABNORMAL LOW (ref 22–32)
Calcium: 8.3 mg/dL — ABNORMAL LOW (ref 8.9–10.3)
Chloride: 106 mmol/L (ref 98–111)
Creatinine, Ser: 15.28 mg/dL — ABNORMAL HIGH (ref 0.44–1.00)
GFR, Estimated: 2 mL/min — ABNORMAL LOW (ref >60)
Glucose, Bld: 99 mg/dL (ref 70–99)
Potassium: 4.3 mmol/L (ref 3.5–5.1)
Sodium: 136 mmol/L (ref 135–145)

## 2021-04-20 LAB — CBC
HCT: 24.9 % — ABNORMAL LOW (ref 36.0–46.0)
Hemoglobin: 8.2 g/dL — ABNORMAL LOW (ref 12.0–15.0)
MCH: 30.1 pg (ref 26.0–34.0)
MCHC: 32.9 g/dL (ref 30.0–36.0)
MCV: 91.5 fL (ref 80.0–100.0)
Platelets: 345 10*3/uL (ref 150–400)
RBC: 2.72 MIL/uL — ABNORMAL LOW (ref 3.87–5.11)
RDW: 14.1 % (ref 11.5–15.5)
WBC: 12.7 10*3/uL — ABNORMAL HIGH (ref 4.0–10.5)
nRBC: 0 % (ref 0.0–0.2)

## 2021-04-20 LAB — GLUCOSE, CAPILLARY
Glucose-Capillary: 90 mg/dL (ref 70–99)
Glucose-Capillary: 72 mg/dL (ref 70–99)
Glucose-Capillary: 88 mg/dL (ref 70–99)
Glucose-Capillary: 113 mg/dL — ABNORMAL HIGH (ref 70–99)

## 2021-04-20 LAB — SURGICAL PCR SCREEN
MRSA, PCR: POSITIVE — AB
Staphylococcus aureus: POSITIVE — AB

## 2021-04-20 LAB — TYPE AND SCREEN
ABO/RH(D): A POS
Antibody Screen: NEGATIVE

## 2021-04-20 LAB — SURGICAL PATHOLOGY

## 2021-04-20 MED ORDER — SODIUM CHLORIDE 0.9 % IV SOLN: 2.0000 g | INTRAVENOUS | Status: DC

## 2021-04-20 MED ORDER — VITAMIN K1 10 MG/ML IJ SOLN
2.0000 mg | INTRAVENOUS | Status: AC
  Administered 2021-04-20: 2 mg via INTRAVENOUS
  Filled 2021-04-20: qty 0.2

## 2021-04-20 MED ORDER — MUPIROCIN 2 % EX OINT
1.0000 "application " | TOPICAL_OINTMENT | Freq: Two times a day (BID) | CUTANEOUS | Status: AC
  Administered 2021-04-20 – 2021-04-24 (×10): 1 via NASAL
  Filled 2021-04-20 (×2): qty 22

## 2021-04-20 MED ORDER — CHLORHEXIDINE GLUCONATE CLOTH 2 % EX PADS
6.0000 | MEDICATED_PAD | Freq: Every day | CUTANEOUS | Status: AC
  Administered 2021-04-20 – 2021-04-24 (×5): 6 via TOPICAL

## 2021-04-20 MED ORDER — DEXTROSE 10 % IV SOLN: INTRAVENOUS | Status: DC

## 2021-04-20 MED ORDER — HEPARIN (PORCINE) 25000 UT/250ML-% IV SOLN
1600.0000 [IU]/h | INTRAVENOUS | Status: DC
  Administered 2021-04-20: 1300 [IU]/h via INTRAVENOUS
  Filled 2021-04-20: qty 250

## 2021-04-20 MED ORDER — SODIUM CHLORIDE 0.9 % IV SOLN
2.0000 g | INTRAVENOUS | Status: AC
  Administered 2021-04-21: 2 g via INTRAVENOUS

## 2021-04-20 MED ORDER — VITAMIN K1 10 MG/ML IJ SOLN
2.0000 mg | Freq: Once | INTRAVENOUS | Status: AC
  Administered 2021-04-20: 2 mg via INTRAVENOUS
  Filled 2021-04-20: qty 0.2

## 2021-04-20 NOTE — Progress Notes (Signed)
[facility] for Phenobarbital Indication: h/o seizures  No Known Allergies  Patient Measurements: Weight: (!) 136.5 kg (301 lb)   Vital Signs: Temp: 98.1 F (36.7 C) (01/06 0748) Temp Source: Oral (01/06 0748) BP: 128/50 (01/06 0748) Pulse Rate: 91 (01/06 0748) Intake/Output from previous day: 01/05 0701 - 01/06 0700 In: 900 [I.V.:800; IV Piggyback:100] Out: 612 [Urine:601; Stool:1; Blood:10] Intake/Output from this shift: No intake/output data recorded.  Labs: Recent Labs    04/18/21 1718 04/19/21 0359 04/20/21 0037  WBC 11.4* 11.8* 12.7*  HGB 8.5* 8.4* 8.2*  HCT 27.0* 26.0* 24.9*  PLT 383 360 345  CREATININE 14.34* 14.74* 15.28*  ALBUMIN 2.8*  --   --   PROT 7.2  --   --   AST 44*  --   --   ALT 28  --   --   ALKPHOS 83  --   --   BILITOT 0.2*  --   --     Estimated Creatinine Clearance: 5.1 mL/min (A) (by C-G formula based on SCr of 15.28 mg/dL (H)).   PTA seizure medications:  Phenobarbital 97.2 mg PO bid  Assessment: 61 y.o. F presents with elevated INR of 8.5. Pt on warfarin PTA for afib. CBC ok on admission. Reversed with vit K 2mg  IV 1/4, 1/5, 1/6. Home dose: 15 mg (5 mg x 3) every Mon, Wed, Fri; 10 mg (5 mg x 2) all other days  Pt on phenobarbital PTA for h/o seizures. Pt admitted with AKI (Scr up to 15.28. SCr was 0.85 ~2 yrs ago.  Phenobarbital level on 1/5 was 30.7 however this was not a trough and would expect a lower trough. No new seizures noted.  Goal of Therapy:  Phenobarbital level 10-30 mcg/ml  Plan:  Continue home phenobarbital 97.2 mg PO BID  Pharmacy signing off but will continue to follow peripherally - please re-consult if needed   Thank you for involving pharmacy in this patient's care.  Renold Genta, PharmD, BCPS Clinical Pharmacist Clinical phone for 04/20/2021 until 3p is [phone] 04/20/2021 11:15 AM  **Pharmacist phone directory can be found on [email or web address] listed under [place]**

## 2021-04-20 NOTE — Progress Notes (Signed)
[facility] Progress Note   61 y.o. 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 ED after an abnormal INR (>8) was obtained at an outpatient lab.  She reports that she has been having vaginal bleeding for the past 2 months and they thought it was due to supratherapeutic INR and has been adjusting her coumadin.  In the ED labs were notable for INR 8.5 but she also had a BUN 108, Cr 14.34, Co2 15, alb 2.8, WBC 11.4, Hgb 8.5.  A CT scan of abdomen and pelvis without contrast was ordered and revealed bilateral hydronephrosis and hydroureter without clear cause of obstruction, no stones, an enlarged uterus, and retroperitoneal lymphadenopathy.  We were consulted to further evaluate her AKI.  She has not had a baseline Scr in the EMR since 2020. Little UOP.    Assessment/ Plan:    ARF - due to obstructive uropathy with bilateral hydronephrosis and hydroureter.  CT scan with enlarged uterus and retroperitoneal lymphadenopathy.  Foley catheter placed with minimal UOP.  Here for bilateral percutaneous nephrostomy tube placement by IR.  No indication for dialysis at this time.  Will follow renal function after obstruction is relieved and hopefully will get full recovery, although it sounds like this has been going on for some time. She states that she has had issues with urination for a mth but had outpatient labs 2 weeks ago but did not know results. - If early then renal function should quickly improve after relief of obstruction. - Will follow closely after PCN's are placed. -Monitor Daily I/Os, Daily weight  -Maintain MAP>65 for optimal renal perfusion.  -Avoid nephrotoxic medications including NSAIDs - Dose meds for GFR <50ml/min for now  Obstructive uropathy - will need bilateral PCN by IR but need to treat elevated INR.   Coagulopathy - given vitamin K and INR improved to 4.1. Uterine mass with vaginal  bleeding - concerning for malignancy given retroperitoneal lymphadenopathy. 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  Anemia - normocytic and likely due to chronic vaginal bleeding for past 2 months.  Transfuse prn.  Atrial fibrillation - rate controlled.  Anticoagulation on hold as above.  Anion gap metabolic acidosis - due to ARF.  Follow after PCN and will add HCO3 if needed.    Subjective:   Mild abd discomfort but denies nausea, fever, chills, dyspnea.   Objective:   BP 132/81 (BP Location: Right Wrist)    Pulse 84    Temp 97.9 F (36.6 C) (Oral)    Resp 19    Wt (!) 136.5 kg    SpO2 100%    BMI 55.05 kg/m   Intake/Output Summary (Last 24 hours) at 04/20/2021 1254 Last data filed at 04/20/2021 0400 Gross per 24 hour  Intake 800 ml  Output 602 ml  Net 198 ml   Weight change:   Physical Exam: General appearance: alert, cooperative, no distress, and morbidly obese Head: NCAT Resp: CTA b/l Cardio: RRR GI: S, mild distention but no rebound or guarding +BS Extremities: edema chronic lower extremity edema, trace  Imaging: CT ABDOMEN PELVIS WO CONTRAST  Result Date: 04/18/2021 CLINICAL DATA:  Generalized abdominal pain and bloating for 1 month, vaginal bleeding EXAM: CT ABDOMEN AND PELVIS WITHOUT CONTRAST TECHNIQUE: Multidetector CT imaging of the abdomen and pelvis was performed following the standard protocol without IV contrast.  COMPARISON:  None. FINDINGS: Lower chest: No acute pleural or parenchymal lung disease. Hepatobiliary: High attenuation material dependently within the gallbladder may reflect a noncalcified gallstone or biliary sludge. No evidence of acute cholecystitis. Unenhanced imaging of the liver is unremarkable. Pancreas: Unremarkable unenhanced appearance. Spleen: Unremarkable unenhanced appearance. Adrenals/Urinary Tract: Bilateral hydronephrosis and hydroureter, without clear cause identified  for obstruction. No ureteral or bladder calculi. The bladder is decompressed with a Foley catheter. Calcifications at the renal hila are likely vascular. Simple appearing cyst lower pole left kidney. The adrenals are unremarkable. Stomach/Bowel: No bowel obstruction or ileus. Normal appendix right lower quadrant. No bowel wall thickening or inflammatory change. Vascular/Lymphatic: Mild diffuse atherosclerosis of the aorta and its branches. Evaluation of the vascular lumen is limited without IV contrast. There are multiple enlarged retroperitoneal lymph nodes. Index lymph node in the left para-aortic region image 38/2 measures 14 mm in short axis. Left external iliac chain lymph node demonstrates increased density, measuring 17 mm reference image 66/2. Reproductive: The uterus is enlarged, with minimal high attenuation within the endometrial cavity which could reflect calcification or blood products. If further evaluation of the uterus is desired, pelvic ultrasound could be performed. There are no adnexal masses. Other: No free intra-abdominal fluid or free gas. No abdominal wall hernia. Musculoskeletal: Symmetrical sclerosis along the sacroiliac joints. Changes of osteitis pubis. No acute displaced fracture. Reconstructed images demonstrate no additional findings. IMPRESSION: 1. Bilateral hydronephrosis and hydroureter, without clear cause for obstruction. No urinary tract calculi. 2. Enlarged uterus, with minimal high attenuation within the endometrial cavity, which could reflect blood products. Given clinical history of vaginal bleeding, follow-up pelvic ultrasound may be useful. 3. Retroperitoneal lymphadenopathy. 4. High attenuation material dependently within the gallbladder, consistent with noncalcified gallstones or sludge. No evidence of acute cholecystitis. 5.  Aortic Atherosclerosis (ICD10-I70.0). Electronically Signed   By: Randa Ngo M.D.   On: 04/18/2021 21:06   DG C-Arm 1-60 Min-No  Report  Result Date: 04/19/2021 Fluoroscopy was utilized by the requesting physician.  No radiographic interpretation.   US PELVIC COMPLETE WITH TRANSVAGINAL  Result Date: 04/19/2021 CLINICAL DATA:  Uterine mass?  , History of vaginal bleeding. EXAM: TRANSABDOMINAL AND TRANSVAGINAL ULTRASOUND OF PELVIS TECHNIQUE: Both transabdominal and transvaginal ultrasound examinations of the pelvis were performed. Transabdominal technique was performed for global imaging of the pelvis including uterus, ovaries, adnexal regions, and pelvic cul-de-sac. It was necessary to proceed with endovaginal exam following the transabdominal exam to visualize the endometrium. COMPARISON:  CT examination dated April 18, 2021 FINDINGS: Uterus Measurements: 13.0 x 5.3 x 7.1 = volume: 238 mL. No fibroids or other mass visualized. Evaluation is however limited due to body habitus. Patient was not able to tolerate transvaginal examination. Endometrium Thickness: 9.7 mm.  No focal abnormality visualized. Right ovary Not visualized Left ovary Not visualized Other findings No abnormal free fluid. IMPRESSION: Heterogeneous uterus without appreciable mass. Prominent endometrium in this post menopausal woman. Further evaluation with MRI examination or direct visualization is recommended. Bilateral ovaries were not seen. Exam is limited due to patient's inability to tolerate transvaginal examination. Electronically Signed   By: Keane Police D.O.   On: 04/19/2021 11:06    Labs: BMET Recent Labs  Lab 04/18/21 1718 04/19/21 0359 04/20/21 0037  NA 139 137 136  K 3.8 3.7 4.3  CL 107 107 106  CO2 15* 13* 15*  GLUCOSE 120* 93 99  BUN 108* 106* 106*  CREATININE 14.34* 14.74* 15.28*  CALCIUM 8.1* 8.0* 8.3*   CBC Recent  Labs  Lab 04/18/21 1718 04/19/21 0359 04/20/21 0037  WBC 11.4* 11.8* 12.7*  NEUTROABS 8.5*  --   --   HGB 8.5* 8.4* 8.2*  HCT 27.0* 26.0* 24.9*  MCV 92.5 92.9 91.5  PLT 383 360 345    Medications:     atenolol   50 mg Oral Daily   Chlorhexidine Gluconate Cloth  6 each Topical Q0600   insulin aspart  0-15 Units Subcutaneous TID WC   insulin aspart  0-5 Units Subcutaneous QHS   mupirocin ointment  1 application Nasal BID   PHENobarbital  97.2 mg Oral BID      Otelia Santee, MD 04/20/2021, 12:54 PM

## 2021-04-20 NOTE — Progress Notes (Signed)
Mobility Specialist Progress Note:   04/20/21 1430  Mobility  Activity Ambulated in room  Level of Assistance Contact guard assist, steadying assist  Assistive Device None;Other (Comment) (IV Pole)  Distance Ambulated (ft) 60 ft  Mobility Ambulated with assistance in room  Mobility Response Tolerated well  Mobility performed by Mobility specialist  $Mobility charge 1 Mobility   Pt independent with bed mobility, and transfers. Required HHA during ambulation d/t unsteadiness. Pt required verbal cues to slow down during ambulation. Pt states mobility is at baseline, and usually uses HHA from daughters when ambulating longer distances. Left pt back in bed with all needs met.   Nelta Numbers Mobility Specialist  Phone [phone]
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
## 2021-04-20 NOTE — Progress Notes (Signed)
Interventional Radiology Brief Note:  Patient planning for bilateral PCN placements in IR.   INR remains elevated at 1.8.   Patient currently stable.  Creatinine remains high at 15.2 WBC stable at 12.7 (11.4 on admission).   Currently afebrile.  Hemodynamically stable.  Decreased UOP without signs of sepsis.  Foley in place.   Discussed with Dr. Laurence Ferrari.   Reassess INR tomorrow morning with goal being </=1.5.   Orders placed for INR.  NPO p MN.  May eat this evening.   RN aware.   Brynda Greathouse, MS RD PA-C 4:50 PM

## 2021-04-20 NOTE — Progress Notes (Signed)
PROGRESS NOTE  Debbie [name] IBB:[id number] DOB: 1960-09-26 DOA: 04/18/2021 PCP: [facility] [place]  Brief History   Debbie [name] is a 61 y.o. female with medical history significant for seizure disorder, pulmonary embolism on warfarin, atrial fibrillation, hypertension, who was referred by the warfarin clinic to the emergency room for elevated INR.  The patient complained of a 83-month history of vaginal bleeding.  She was given megestrol prescription about 5 days prior to admission.  She also complained of generalized weakness, fatigue shortness of breath and 50 pound weight loss over a 39-month period.  At [facility] the patient was found to have a creatinine of 14.34. Imagery revealed hydronephrosis and a distended bladder and uterus, retroperitoneal lymphadenopathy. There also was gallbladder sludge.   Urology at [facility] performed cystoscopy with biopsies that discovered that the ureteral orifices could not be identified due to bladder mass. Lesions in the bladder were fulgrated. The patient was sent to [facility] for placement of bilateral percutaneous nephrostomy tubes. She will also require the services of gynecological oncology. Her anticoagulation has been held pending the IR procedure. It was planned for today, but could not be done today due to continued elevated INR. She will be given 2 mg IV Vitamin K. She will be kept NPO overnight to have the procedure done tomorrow. She will be placed on a heparin drip overnight to be stopped first thing in the am.  Consultants  Interventional radiology Urology  Procedures  Cystoscopy with biopsies and fulgration  Antibiotics   Anti-infectives (From admission, onward)    Start     Dose/Rate Route Frequency Ordered Stop   04/21/21 0700  cefTRIAXone (ROCEPHIN) 2 g in sodium chloride 0.9 % 100 mL IVPB        2 g 200 mL/hr over 30 Minutes Intravenous On call 04/20/21 1656 04/22/21 0700   04/20/21 1400  cefTRIAXone (ROCEPHIN) 2 g in  sodium chloride 0.9 % 100 mL IVPB  Status:  Discontinued        2 g 200 mL/hr over 30 Minutes Intravenous On call 04/20/21 0956 04/20/21 1656   04/19/21 1145  ceFAZolin (ANCEF) IVPB 3g/100 mL premix        3 g 200 mL/hr over 30 Minutes Intravenous  Once 04/19/21 1135 04/19/21 1250      Subjective  The patient is resting comfortably. No new complaints.   Objective   Vitals:  Vitals:   04/20/21 1229 04/20/21 1646  BP: 132/81 130/76  Pulse: 84 86  Resp: 19 19  Temp: 97.9 F (36.6 C) 98.2 F (36.8 C)  SpO2: 100% 100%    Exam:  Constitutional:  The patient is awake, alert, and oriented x 3. No acute distress. Respiratory:  No increased work of breathing. No wheezes, rales, or rhonchi No tactile fremitus Cardiovascular:  Regular rate and rhythm No murmurs, ectopy, or gallups. No lateral PMI. No thrills. Abdomen:  Abdomen is soft, non-tender, non-distended No hernias, masses, or organomegaly Normoactive bowel sounds.  Musculoskeletal:  No cyanosis, clubbing, or edema Skin:  No rashes, lesions, ulcers palpation of skin: no induration or nodules Neurologic:  CN 2-12 intact Sensation all 4 extremities intact Psychiatric:  Mental status Mood, affect appropriate Orientation to person, place, time  judgment and insight appear intact   I have personally reviewed the following:   Today's Data  Vitals  Lab Data  CBC BMP INR  Micro Data  MRSA screen positive for MRSA and staph aureus  Imaging  CT abdomen  and pelvis  Cardiology Data  EKG  Other Data    Scheduled Meds:  atenolol  50 mg Oral Daily   Chlorhexidine Gluconate Cloth  6 each Topical Q0600   insulin aspart  0-15 Units Subcutaneous TID WC   insulin aspart  0-5 Units Subcutaneous QHS   mupirocin ointment  1 application Nasal BID   PHENobarbital  97.2 mg Oral BID   Continuous Infusions:  [START ON 04/21/2021] cefTRIAXone (ROCEPHIN)  IV     phytonadione (VITAMIN K) IV      Principal  Problem:   AKI (acute kidney injury) ([place]) Active Problems:   Acute pulmonary embolism (HCC)   Acute deep vein thrombosis (DVT) of right lower extremity (HCC)   Unspecified atrial fibrillation (HCC)   HTN (hypertension)   Seizure disorder (HCC)   LOS: 2 days   A & P  AKI, obstructive uropathy, bilateral hydronephrosis: Foley catheter is in place.  S/p bladder biopsy with fulguration today. Plan to transfer to [facility] today for nephrostomy. Case discussed with Dr. Rica Records and he said no indication for IV fluids at this time. Creatinine is 15.28 today.   Coumadin coagulopathy: INR was 8.5 on admission.  S/p IV vitamin K.  INR is down to 4.1.  Last creatinine was 0.85 on EMR in April 2020. INR 2.4 last night and 1.8 this morning. I will give another 2 mg IV Vitamin K x 1 tonight.    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 Vitamin K given to normalize INR for nephrostomy tube placement. Will start heparin drip overnight due to patient's history of DVT PE and apparent malignancy increasing her risk for thrombosis.   Hypertension: Continue atenolol  Type II DM: NovoLog as needed for hyperglycemia  Seizure disorder: Continue phenobarbital  I have seen and examined this patient myself. I have spent 35 minutes in her evaluation and care.  DVT prophylaxis: Heparin gtt CODE STATUS: Full Code Family Communication: Family at bedside Disposition: TBD   Tadao Emig, DO [facility] Direct contact: see [email or web address]  7PM-7AM contact night coverage as above 04/20/2021, 5:57 PM  LOS: 2 days

## 2021-04-20 NOTE — Progress Notes (Signed)
Mobility Specialist Progress Note:   04/20/21 1600  Mobility  Activity Ambulated to bathroom  Level of Assistance Contact guard assist, steadying assist  Assistive Device Other (Comment) (HHA)  Distance Ambulated (ft) 15 ft  Mobility Out of bed for toileting  Mobility Response Tolerated well  Mobility performed by Mobility specialist;Nurse tech  $Mobility charge 1 Mobility   Pt requesting to go to BR. Required HHA during ambulation for steadying. Pt ambulated slower this session, displaying steady gait. Pt left in BR with NT for pericare.   Nelta Numbers Mobility Specialist  Phone [phone]

## 2021-04-20 NOTE — Progress Notes (Signed)
ANTICOAGULATION CONSULT NOTE - Initial Consult  Pharmacy Consult for IV Heparin Indication:  Hx DVT/PE and malignancy  No Known Allergies  Patient Measurements: Weight: (!) 136.5 kg (301 lb) Ideal body weight 50 kg Heparin Dosing Weight: 84.7 kg  Vital Signs: Temp: 98.2 F (36.8 C) (01/06 1646) Temp Source: Oral (01/06 1646) BP: 130/76 (01/06 1646) Pulse Rate: 86 (01/06 1646)  Labs: Recent Labs    04/18/21 1718 04/19/21 0359 04/19/21 2227 04/20/21 0037 04/20/21 1100  HGB 8.5* 8.4*  --  8.2*  --   HCT 27.0* 26.0*  --  24.9*  --   PLT 383 360  --  345  --   LABPROT 70.3* 40.0* 29.0* 26.5* 21.0*  INR 8.5* 4.1* 2.7* 2.4* 1.8*  CREATININE 14.34* 14.74*  --  15.28*  --     Estimated Creatinine Clearance: 5.1 mL/min (A) (by C-G formula based on SCr of 15.28 mg/dL (H)).   Medical History: Past Medical History:  Diagnosis Date   Hypertension    Seizures ([place])     Assessment: 61 years of age female on warfarin prior to admission who was admitted with INR of 8.5. Patient now s/p several doses of Vitamin K 2mg  po and INR is down to 1.8. MD consulted pharmacy to start IV heparin due to high clot risk with history of DVT/PE and malignancy. MD is also giving another dose of Vit K 2mg  po x1 tonight to lower INR to <=1.5 for procedure tomorrow AM. Heparin will stop tomorrow AM for procedure.  Patient was noted to have vaginal bleeding on admission and found to have bladder mass.  Hgb is 8.2 today (low-stable). Platelets are within normal limits.   Goal of Therapy:  Heparin level 0.3-0.7 units/ml Monitor platelets by anticoagulation protocol: Yes   Plan:  Start IV Heparin at 1300 units/hr. Heparin level in 8 hours.  Follow-up with IR/procedure time in AM for when to stop IV Heparin.   Sloan Leiter, PharmD, BCPS, BCCCP Clinical Pharmacist Please refer to [facility] for [facility]acy numbers 04/20/2021,6:08 PM

## 2021-04-21 ENCOUNTER — Inpatient Hospital Stay (HOSPITAL_COMMUNITY): Payer: Medicaid Other

## 2021-04-21 DIAGNOSIS — N179 Acute kidney failure, unspecified: Secondary | ICD-10-CM | POA: Diagnosis not present

## 2021-04-21 HISTORY — PX: IR NEPHROSTOMY PLACEMENT LEFT: IMG6063

## 2021-04-21 HISTORY — PX: IR NEPHROSTOMY PLACEMENT RIGHT: IMG6064

## 2021-04-21 LAB — PROTIME-INR
INR: 1.4 — ABNORMAL HIGH (ref 0.8–1.2)
Prothrombin Time: 17.2 seconds — ABNORMAL HIGH (ref 11.4–15.2)

## 2021-04-21 LAB — GLUCOSE, CAPILLARY
Glucose-Capillary: 68 mg/dL — ABNORMAL LOW (ref 70–99)
Glucose-Capillary: 93 mg/dL (ref 70–99)
Glucose-Capillary: 84 mg/dL (ref 70–99)
Glucose-Capillary: 128 mg/dL — ABNORMAL HIGH (ref 70–99)
Glucose-Capillary: 106 mg/dL — ABNORMAL HIGH (ref 70–99)

## 2021-04-21 LAB — BASIC METABOLIC PANEL WITH GFR
Anion gap: 15 (ref 5–15)
BUN: 113 mg/dL — ABNORMAL HIGH (ref 8–23)
CO2: 11 mmol/L — ABNORMAL LOW (ref 22–32)
Calcium: 8.1 mg/dL — ABNORMAL LOW (ref 8.9–10.3)
Chloride: 105 mmol/L (ref 98–111)
Creatinine, Ser: 16.58 mg/dL — ABNORMAL HIGH (ref 0.44–1.00)
GFR, Estimated: 2 mL/min — ABNORMAL LOW
Glucose, Bld: 94 mg/dL (ref 70–99)
Potassium: 4 mmol/L (ref 3.5–5.1)
Sodium: 131 mmol/L — ABNORMAL LOW (ref 135–145)

## 2021-04-21 LAB — CBC
HCT: 23.2 % — ABNORMAL LOW (ref 36.0–46.0)
Hemoglobin: 7.4 g/dL — ABNORMAL LOW (ref 12.0–15.0)
MCH: 29.2 pg (ref 26.0–34.0)
MCHC: 31.9 g/dL (ref 30.0–36.0)
MCV: 91.7 fL (ref 80.0–100.0)
Platelets: 342 K/uL (ref 150–400)
RBC: 2.53 MIL/uL — ABNORMAL LOW (ref 3.87–5.11)
RDW: 13.9 % (ref 11.5–15.5)
WBC: 11.2 K/uL — ABNORMAL HIGH (ref 4.0–10.5)
nRBC: 0 % (ref 0.0–0.2)

## 2021-04-21 LAB — HEPARIN LEVEL (UNFRACTIONATED): Heparin Unfractionated: 0.12 [IU]/mL — ABNORMAL LOW (ref 0.30–0.70)

## 2021-04-21 MED ORDER — LIDOCAINE HCL 1 % IJ SOLN
INTRAMUSCULAR | Status: AC | PRN
  Administered 2021-04-21: 10 mL

## 2021-04-21 MED ORDER — MIDAZOLAM HCL 2 MG/2ML IJ SOLN
INTRAMUSCULAR | Status: AC
  Filled 2021-04-21: qty 2

## 2021-04-21 MED ORDER — FENTANYL CITRATE (PF) 100 MCG/2ML IJ SOLN
INTRAMUSCULAR | Status: AC | PRN
  Administered 2021-04-21: 25 ug via INTRAVENOUS

## 2021-04-21 MED ORDER — FENTANYL CITRATE (PF) 100 MCG/2ML IJ SOLN
INTRAMUSCULAR | Status: AC
  Filled 2021-04-21: qty 2

## 2021-04-21 MED ORDER — STERILE WATER FOR INJECTION IV SOLN
INTRAVENOUS | Status: DC
  Filled 2021-04-21 (×4): qty 1000

## 2021-04-21 MED ORDER — MIDAZOLAM HCL 2 MG/2ML IJ SOLN
INTRAMUSCULAR | Status: AC | PRN
  Administered 2021-04-21: 1 mg via INTRAVENOUS

## 2021-04-21 MED ORDER — LIDOCAINE HCL 1 % IJ SOLN
INTRAMUSCULAR | Status: AC
  Filled 2021-04-21: qty 20

## 2021-04-21 MED ORDER — SODIUM BICARBONATE 650 MG PO TABS
650.0000 mg | ORAL_TABLET | Freq: Two times a day (BID) | ORAL | Status: DC
Start: 2021-04-21 — End: 2021-04-21

## 2021-04-21 MED ORDER — DEXTROSE 50 % IV SOLN
INTRAVENOUS | Status: AC
  Filled 2021-04-21: qty 50

## 2021-04-21 MED ORDER — SODIUM CHLORIDE 0.9 % IV SOLN
INTRAVENOUS | Status: AC
  Filled 2021-04-21: qty 20

## 2021-04-21 MED ORDER — IOHEXOL 300 MG/ML  SOLN
100.0000 mL | Freq: Once | INTRAMUSCULAR | Status: AC | PRN
  Administered 2021-04-21: 10 mL

## 2021-04-21 MED ORDER — HEPARIN (PORCINE) 25000 UT/250ML-% IV SOLN
1600.0000 [IU]/h | INTRAVENOUS | Status: DC
  Administered 2021-04-21 – 2021-04-28 (×10): 1600 [IU]/h via INTRAVENOUS
  Filled 2021-04-21 (×13): qty 250

## 2021-04-21 MED ORDER — ACETAMINOPHEN 325 MG PO TABS
650.0000 mg | ORAL_TABLET | Freq: Four times a day (QID) | ORAL | Status: DC | PRN
  Administered 2021-04-21 – 2021-04-24 (×2): 650 mg via ORAL
  Filled 2021-04-21 (×3): qty 2

## 2021-04-21 NOTE — Procedures (Signed)
Interventional Radiology Procedure Note  Procedure: BILATERAL 10 FR PCNS    Complications: None  Estimated Blood Loss:  MIN  Findings: FULL REPORT IN PACS     Tamera Punt, MD

## 2021-04-21 NOTE — Progress Notes (Signed)
ANTICOAGULATION CONSULT NOTE - Follow Up Consult  Pharmacy Consult for heparin Indication:  h/o VTE and malignancy  Labs: Recent Labs    04/19/21 0359 04/19/21 2227 04/20/21 0037 04/20/21 1100 04/21/21 0534  HGB 8.4*  --  8.2*  --  7.4*  HCT 26.0*  --  24.9*  --  23.2*  PLT 360  --  345  --  342  LABPROT 40.0*   < > 26.5* 21.0* 17.2*  INR 4.1*   < > 2.4* 1.8* 1.4*  HEPARINUNFRC  --   --   --   --  0.12*  CREATININE 14.74*  --  15.28*  --  16.58*   < > = values in this interval not displayed.    Assessment: 60yo female subtherapeutic on heparin with initial dosing while Coumadin on hold; no infusion issues or signs of bleeding per RN.  Goal of Therapy:  Heparin level 0.3-0.7 units/ml   Plan:  Will increase heparin infusion by 3-4 units/kgABW/hr to 1600 units/hr and check level in 8 hours.    Wynona Neat, PharmD, BCPS  04/21/2021,6:48 AM

## 2021-04-21 NOTE — Progress Notes (Signed)
PROGRESS NOTE  Debbie [name] VEL:[id number] DOB: 1960/12/19 DOA: 04/18/2021 PCP: [facility] [place]  Brief History   Debbie [name] is a 61 y.o. female with medical history significant for seizure disorder, pulmonary embolism on warfarin, atrial fibrillation, hypertension, who was referred by the warfarin clinic to the emergency room for elevated INR.  The patient complained of a 70-month history of vaginal bleeding.  She was given megestrol prescription about 5 days prior to admission.  She also complained of generalized weakness, fatigue shortness of breath and 50 pound weight loss over a 73-month period.  At [facility] the patient was found to have a creatinine of 14.34. Imagery revealed hydronephrosis and a distended bladder and uterus, retroperitoneal lymphadenopathy. There also was gallbladder sludge.   Urology at [facility] performed cystoscopy with biopsies that discovered that the ureteral orifices could not be identified due to bladder mass. Lesions in the bladder were fulgrated. The patient was sent to [facility] for placement of bilateral percutaneous nephrostomy tubes. She will also require the services of gynecological oncology. Her anticoagulation has been held pending the IR procedure. It was planned for today, but could not be done today due to continued elevated INR. She will be given 2 mg IV Vitamin K.   The patient underwent bilateral nephrostomy tube placement by IR today. She has tolerated the procedure well, although she complains of pain in her flank region bilaterally at the site of the procedure.   Consultants  Interventional radiology Urology  Procedures  Cystoscopy with biopsies and fulgration  Antibiotics   Anti-infectives (From admission, onward)    Start     Dose/Rate Route Frequency Ordered Stop   04/21/21 1006  sodium chloride 0.9 % with cefTRIAXone (ROCEPHIN) ADS Med       Note to Pharmacy: Fredric Dine: cabinet override      04/21/21 1006  04/21/21 2214   04/21/21 1000  cefTRIAXone (ROCEPHIN) 2 g in sodium chloride 0.9 % 100 mL IVPB  Status:  Discontinued        2 g 200 mL/hr over 30 Minutes Intravenous On call 04/20/21 1827 04/20/21 1828   04/21/21 1000  cefTRIAXone (ROCEPHIN) 2 g in sodium chloride 0.9 % 100 mL IVPB        2 g 200 mL/hr over 30 Minutes Intravenous On call 04/20/21 1828 04/21/21 1228   04/21/21 0700  cefTRIAXone (ROCEPHIN) 2 g in sodium chloride 0.9 % 100 mL IVPB  Status:  Discontinued        2 g 200 mL/hr over 30 Minutes Intravenous On call 04/20/21 1656 04/20/21 1827   04/20/21 1400  cefTRIAXone (ROCEPHIN) 2 g in sodium chloride 0.9 % 100 mL IVPB  Status:  Discontinued        2 g 200 mL/hr over 30 Minutes Intravenous On call 04/20/21 0956 04/20/21 1656   04/19/21 1145  ceFAZolin (ANCEF) IVPB 3g/100 mL premix        3 g 200 mL/hr over 30 Minutes Intravenous  Once 04/19/21 1135 04/19/21 1250      Subjective  The patient is resting comfortably. No new complaints other than soreness and pain at the site of the procedure.  Objective   Vitals:  Vitals:   04/21/21 1241 04/21/21 1514  BP: 128/90 122/78  Pulse: 81 82  Resp: 19 17  Temp:  98.3 F (36.8 C)  SpO2: 100%     Exam:  Constitutional:  The patient is awake, alert, and oriented x 3. Mild distress from  bilateral flank soreness. Respiratory:  No increased work of breathing. No wheezes, rales, or rhonchi No tactile fremitus Cardiovascular:  Regular rate and rhythm No murmurs, ectopy, or gallups. No lateral PMI. No thrills. Abdomen:  Abdomen is soft, non-tender, non-distended No hernias, masses, or organomegaly Normoactive bowel sounds.  Musculoskeletal:  No cyanosis, clubbing, or edema Skin:  No rashes, lesions, ulcers palpation of skin: no induration or nodules Neurologic:  CN 2-12 intact Sensation all 4 extremities intact Psychiatric:  Mental status Mood, affect appropriate Orientation to person, place, time  judgment and  insight appear intact   I have personally reviewed the following:   Today's Data  Vitals  Lab Data  CBC BMP INR  Micro Data  MRSA screen positive for MRSA and staph aureus  Imaging  CT abdomen and pelvis  Cardiology Data  EKG  Other Data    Scheduled Meds:  atenolol  50 mg Oral Daily   Chlorhexidine Gluconate Cloth  6 each Topical Q0600   dextrose       fentaNYL       insulin aspart  0-15 Units Subcutaneous TID WC   insulin aspart  0-5 Units Subcutaneous QHS   lidocaine       midazolam       mupirocin ointment  1 application Nasal BID   PHENobarbital  97.2 mg Oral BID   Continuous Infusions:  heparin 1,600 Units/hr (04/21/21 1446)    sodium bicarbonate (isotonic) infusion in sterile water 50 mL/hr at 04/21/21 0935   cefTRIAXone (ROCEPHIN) IVPB 2 gram/100 mL NS (Mini-Bag Plus)      Principal Problem:   AKI (acute kidney injury) ([place]) Active Problems:   Acute pulmonary embolism (HCC)   Acute deep vein thrombosis (DVT) of right lower extremity (HCC)   Unspecified atrial fibrillation (HCC)   HTN (hypertension)   Seizure disorder (HCC)   LOS: 3 days   A & P  AKI, obstructive uropathy, bilateral hydronephrosis: Foley catheter is in place.  S/p bladder biopsy with fulguration today. Plan to transfer to [facility] today for nephrostomy. Case discussed with Dr. Rica Records and he said no indication for IV fluids at this time. Creatinine is 16.58 today. Bilateral nephrostomy tubes placed today. Monitor creatinine and for post-obstructive diuresis. Strict I's and O's.    Coumadin coagulopathy: INR was 8.5 on admission.  S/p IV vitamin K.  INR is down to 4.1.  Last creatinine was 0.85 on EMR in April 2020. INR 2.4 last night and 1.8 this morning. Heparin drip resumed following procedure.   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 Vitamin K given to normalize INR for nephrostomy tube placement. Heparin drip has been restarted following nephrostomy tube placement.   Hypertension: Continue atenolol  Type II DM: NovoLog as needed for hyperglycemia  Seizure disorder: Continue phenobarbital  I have seen and examined this patient myself. I have spent 35 minutes in her evaluation and care.  DVT prophylaxis: Heparin gtt CODE STATUS: Full Code Family Communication: Family at bedside Disposition: TBD   Alexcia Schools, DO [facility] Direct contact: see [email or web address]  7PM-7AM contact night coverage as above 04/21/2021, 7:25 PM  LOS: 2 days

## 2021-04-21 NOTE — Progress Notes (Addendum)
[facility] Progress Note   61 y.o. 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  She reports that she has been having vaginal bleeding for the past 2 months and they thought it was due to supratherapeutic INR and has been adjusting her coumadin.  In the ED labs were notable for INR 8.5 but she also had a BUN 108, Cr 14.34, Co2 15, alb 2.8, WBC 11.4, Hgb 8.5.  A CT scan of abdomen and pelvis without contrast was ordered and revealed bilateral hydronephrosis and hydroureter without clear cause of obstruction, no stones, an enlarged uterus, and retroperitoneal lymphadenopathy.  We were consulted to further evaluate her AKI.  She has not had a baseline Scr in the EMR since 2020. Little UOP.    Assessment/ Plan:    ARF - due to obstructive uropathy with bilateral hydronephrosis and hydroureter.  CT scan with enlarged uterus and retroperitoneal lymphadenopathy.  Foley catheter placed with minimal UOP.  Here for bilateral percutaneous nephrostomy tube placement by IR.  No indication for dialysis at this time.  Will follow renal function after obstruction is relieved and hopefully will get full recovery, although it sounds like this has been going on for some time. She states that she has had issues with urination for a mth but had outpatient labs 2 weeks ago but did not know results. - If early then renal function should quickly improve after relief of obstruction; b/l PCN possibly today. Delayed bec of elevated INR . - Will start isotonic HCO3 IV at 80ml/hr today. - Will follow closely after PCN's are placed. -Monitor Daily I/Os, Daily weight  -Maintain MAP>65 for optimal renal perfusion.  -Avoid nephrotoxic medications including NSAIDs - Dose meds for GFR <46ml/min for now  Obstructive uropathy - will need bilateral PCN by IR; delayed bec of  elevated INR.   Coagulopathy - given vitamin K and INR improving. Uterine mass with vaginal bleeding - concerning for malignancy given retroperitoneal lymphadenopathy. 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  Anemia - normocytic and likely due to chronic vaginal bleeding for past 2 months.  Transfuse prn.  Atrial fibrillation - rate controlled.  Anticoagulation on hold as above.  Anion gap metabolic acidosis - due to ARF.  Follow after PCN and will add isotonic HCO3 IV at 45ml/hr today.    Subjective:   Mild abd discomfort but denies nausea, fever, chills, dyspnea.   Objective:   BP 117/82 (BP Location: Right Arm)    Pulse 80    Temp 98.3 F (36.8 C)    Resp 15    Wt (!) 136.5 kg    SpO2 (!) 62%    BMI 55.05 kg/m   Intake/Output Summary (Last 24 hours) at 04/21/2021 0746 Last data filed at 04/21/2021 0600 Gross per 24 hour  Intake 662.81 ml  Output 150 ml  Net 512.81 ml   Weight change:   Physical Exam: General appearance: alert, cooperative, no distress, and morbidly obese Head: NCAT Resp: CTA b/l Cardio: RRR GI: S, mild distention but no rebound or guarding +BS Extremities: edema chronic lower extremity edema (R>L)  Imaging: DG C-Arm 1-60 Min-No Report  Result Date: 04/19/2021 Fluoroscopy was utilized by the requesting physician.  No radiographic interpretation.   US PELVIC COMPLETE WITH TRANSVAGINAL  Result Date: 04/19/2021 CLINICAL DATA:  Uterine mass?  , History of vaginal bleeding. EXAM: TRANSABDOMINAL AND TRANSVAGINAL ULTRASOUND OF PELVIS TECHNIQUE: Both transabdominal and transvaginal ultrasound examinations of the pelvis were performed. Transabdominal technique was performed for global imaging of the pelvis including uterus, ovaries, adnexal regions, and pelvic cul-de-sac. It was necessary to proceed with endovaginal exam following the transabdominal exam to visualize the endometrium. COMPARISON:   CT examination dated April 18, 2021 FINDINGS: Uterus Measurements: 13.0 x 5.3 x 7.1 = volume: 238 mL. No fibroids or other mass visualized. Evaluation is however limited due to body habitus. Patient was not able to tolerate transvaginal examination. Endometrium Thickness: 9.7 mm.  No focal abnormality visualized. Right ovary Not visualized Left ovary Not visualized Other findings No abnormal free fluid. IMPRESSION: Heterogeneous uterus without appreciable mass. Prominent endometrium in this post menopausal woman. Further evaluation with MRI examination or direct visualization is recommended. Bilateral ovaries were not seen. Exam is limited due to patient's inability to tolerate transvaginal examination. Electronically Signed   By: Keane Police D.O.   On: 04/19/2021 11:06    Labs: BMET Recent Labs  Lab 04/18/21 1718 04/19/21 0359 04/20/21 0037 04/21/21 0534  NA 139 137 136 131*  K 3.8 3.7 4.3 4.0  CL 107 107 106 105  CO2 15* 13* 15* 11*  GLUCOSE 120* 93 99 94  BUN 108* 106* 106* 113*  CREATININE 14.34* 14.74* 15.28* 16.58*  CALCIUM 8.1* 8.0* 8.3* 8.1*   CBC Recent Labs  Lab 04/18/21 1718 04/19/21 0359 04/20/21 0037 04/21/21 0534  WBC 11.4* 11.8* 12.7* 11.2*  NEUTROABS 8.5*  --   --   --   HGB 8.5* 8.4* 8.2* 7.4*  HCT 27.0* 26.0* 24.9* 23.2*  MCV 92.5 92.9 91.5 91.7  PLT 383 360 345 342    Medications:     atenolol  50 mg Oral Daily   Chlorhexidine Gluconate Cloth  6 each Topical Q0600   insulin aspart  0-15 Units Subcutaneous TID WC   insulin aspart  0-5 Units Subcutaneous QHS   mupirocin ointment  1 application Nasal BID   PHENobarbital  97.2 mg Oral BID      Otelia Santee, MD 04/21/2021, 7:46 AM

## 2021-04-22 DIAGNOSIS — N179 Acute kidney failure, unspecified: Secondary | ICD-10-CM | POA: Diagnosis not present

## 2021-04-22 LAB — BASIC METABOLIC PANEL
Anion gap: 16 — ABNORMAL HIGH (ref 5–15)
Anion gap: 18 — ABNORMAL HIGH (ref 5–15)
BUN: 108 mg/dL — ABNORMAL HIGH (ref 8–23)
BUN: 104 mg/dL — ABNORMAL HIGH (ref 8–23)
CO2: 16 mmol/L — ABNORMAL LOW (ref 22–32)
CO2: 15 mmol/L — ABNORMAL LOW (ref 22–32)
Calcium: 7.8 mg/dL — ABNORMAL LOW (ref 8.9–10.3)
Calcium: 8.4 mg/dL — ABNORMAL LOW (ref 8.9–10.3)
Chloride: 109 mmol/L (ref 98–111)
Chloride: 108 mmol/L (ref 98–111)
Creatinine, Ser: 14.54 mg/dL — ABNORMAL HIGH (ref 0.44–1.00)
Creatinine, Ser: 14.29 mg/dL — ABNORMAL HIGH (ref 0.44–1.00)
GFR, Estimated: 3 mL/min — ABNORMAL LOW (ref >60)
GFR, Estimated: 3 mL/min — ABNORMAL LOW (ref >60)
Glucose, Bld: 107 mg/dL — ABNORMAL HIGH (ref 70–99)
Glucose, Bld: 97 mg/dL (ref 70–99)
Potassium: 3.9 mmol/L (ref 3.5–5.1)
Potassium: 3.7 mmol/L (ref 3.5–5.1)
Sodium: 141 mmol/L (ref 135–145)
Sodium: 141 mmol/L (ref 135–145)

## 2021-04-22 LAB — CBC
HCT: 23.8 % — ABNORMAL LOW (ref 36.0–46.0)
Hemoglobin: 7.7 g/dL — ABNORMAL LOW (ref 12.0–15.0)
MCH: 29.2 pg (ref 26.0–34.0)
MCHC: 32.4 g/dL (ref 30.0–36.0)
MCV: 90.2 fL (ref 80.0–100.0)
Platelets: 387 10*3/uL (ref 150–400)
RBC: 2.64 MIL/uL — ABNORMAL LOW (ref 3.87–5.11)
RDW: 14 % (ref 11.5–15.5)
WBC: 11.5 10*3/uL — ABNORMAL HIGH (ref 4.0–10.5)
nRBC: 0 % (ref 0.0–0.2)

## 2021-04-22 LAB — GLUCOSE, CAPILLARY
Glucose-Capillary: 139 mg/dL — ABNORMAL HIGH (ref 70–99)
Glucose-Capillary: 122 mg/dL — ABNORMAL HIGH (ref 70–99)
Glucose-Capillary: 96 mg/dL (ref 70–99)
Glucose-Capillary: 90 mg/dL (ref 70–99)

## 2021-04-22 LAB — HEPARIN LEVEL (UNFRACTIONATED)
Heparin Unfractionated: >1.1 IU/mL — ABNORMAL HIGH (ref 0.30–0.70)
Heparin Unfractionated: 0.42 IU/mL (ref 0.30–0.70)
Heparin Unfractionated: 0.45 IU/mL (ref 0.30–0.70)

## 2021-04-22 LAB — PROTIME-INR
INR: 1.3 — ABNORMAL HIGH (ref 0.8–1.2)
Prothrombin Time: 16.2 seconds — ABNORMAL HIGH (ref 11.4–15.2)

## 2021-04-22 MED ORDER — SODIUM CHLORIDE 0.9 % IV SOLN: INTRAVENOUS | Status: DC

## 2021-04-22 NOTE — Progress Notes (Signed)
ANTICOAGULATION CONSULT NOTE - Follow Up Consult  Pharmacy Consult for heparin Indication:  h/o VTE and malignancy  Labs: Recent Labs    04/20/21 0037 04/20/21 1100 04/21/21 0534 04/21/21 2319 04/22/21 0414  HGB 8.2*  --  7.4*  --  7.7*  HCT 24.9*  --  23.2*  --  23.8*  PLT 345  --  342  --  387  LABPROT 26.5* 21.0* 17.2*  --  16.2*  INR 2.4* 1.8* 1.4*  --  1.3*  HEPARINUNFRC  --   --  0.12* >1.10* 0.42  CREATININE 15.28*  --  16.58* 14.54*  --      Assessment/Plan:  61yo female therapeutic on heparin after resumed at prior increased rate. Will continue infusion at current rate of 1600 units/hr and confirm stable with additional level.    Wynona Neat, PharmD, BCPS  04/22/2021,5:39 AM

## 2021-04-22 NOTE — Progress Notes (Signed)
PROGRESS NOTE  Debbie [name] QVZ:[id number] DOB: 01/17/1961 DOA: 04/18/2021 PCP: [facility], [place]  Brief History   Debbie [name] is a 61 y.o. female with medical history significant for seizure disorder, pulmonary embolism on warfarin, atrial fibrillation, hypertension, who was referred by the warfarin clinic to the emergency room for elevated INR.  The patient complained of a 25-month history of vaginal bleeding.  She was given megestrol prescription about 5 days prior to admission.  She also complained of generalized weakness, fatigue shortness of breath and 50 pound weight loss over a 5-month period.  At [facility] the patient was found to have a creatinine of 14.34. Imagery revealed hydronephrosis and a distended bladder and uterus, retroperitoneal lymphadenopathy. There also was gallbladder sludge.   Urology at [facility] performed cystoscopy with biopsies that discovered that the ureteral orifices could not be identified due to bladder mass. Lesions in the bladder were fulgrated. The patient was sent to [facility] for placement of bilateral percutaneous nephrostomy tubes. She will also require the services of gynecological oncology. Her anticoagulation has been held pending the IR procedure. It was planned for today, but could not be done today due to continued elevated INR. She will be given 2 mg IV Vitamin K.   The patient underwent bilateral nephrostomy tube placement by IR today. She has tolerated the procedure well, although she complains of pain in her flank region bilaterally at the site of the procedure.   Consultants  Interventional radiology Urology  Procedures  Cystoscopy with biopsies and fulgration  Antibiotics   Anti-infectives (From admission, onward)    Start     Dose/Rate Route Frequency Ordered Stop   04/21/21 1006  sodium chloride 0.9 % with cefTRIAXone (ROCEPHIN) ADS Med       Note to Pharmacy: Fredric Dine: cabinet override      04/21/21 1006  04/21/21 2214   04/21/21 1000  cefTRIAXone (ROCEPHIN) 2 g in sodium chloride 0.9 % 100 mL IVPB  Status:  Discontinued        2 g 200 mL/hr over 30 Minutes Intravenous On call 04/20/21 1827 04/20/21 1828   04/21/21 1000  cefTRIAXone (ROCEPHIN) 2 g in sodium chloride 0.9 % 100 mL IVPB        2 g 200 mL/hr over 30 Minutes Intravenous On call 04/20/21 1828 04/21/21 1228   04/21/21 0700  cefTRIAXone (ROCEPHIN) 2 g in sodium chloride 0.9 % 100 mL IVPB  Status:  Discontinued        2 g 200 mL/hr over 30 Minutes Intravenous On call 04/20/21 1656 04/20/21 1827   04/20/21 1400  cefTRIAXone (ROCEPHIN) 2 g in sodium chloride 0.9 % 100 mL IVPB  Status:  Discontinued        2 g 200 mL/hr over 30 Minutes Intravenous On call 04/20/21 0956 04/20/21 1656   04/19/21 1145  ceFAZolin (ANCEF) IVPB 3g/100 mL premix        3 g 200 mL/hr over 30 Minutes Intravenous  Once 04/19/21 1135 04/19/21 1250      Subjective  The patient is resting comfortably. No new complaints.   Objective   Vitals:  Vitals:   04/22/21 0759 04/22/21 1619  BP: 129/84 101/73  Pulse: 90 89  Resp: 19 19  Temp: 97.9 F (36.6 C) 98.9 F (37.2 C)  SpO2: 100% (!) 86%    Exam:  Constitutional:  The patient is awake, alert, and oriented x 3. No acute distress. Respiratory:  No increased work of  breathing. No wheezes, rales, or rhonchi No tactile fremitus Cardiovascular:  Regular rate and rhythm No murmurs, ectopy, or gallups. No lateral PMI. No thrills. Abdomen:  Abdomen is soft, non-tender, non-distended No hernias, masses, or organomegaly Normoactive bowel sounds.  Musculoskeletal:  No cyanosis, clubbing, or edema Skin:  No rashes, lesions, ulcers palpation of skin: no induration or nodules Neurologic:  CN 2-12 intact Sensation all 4 extremities intact Psychiatric:  Mental status Mood, affect appropriate Orientation to person, place, time  judgment and insight appear intact  I have personally reviewed the  following:   Today's Data  Vitals  Lab Data  CBC BMP INR  Micro Data  MRSA screen positive for MRSA and staph aureus  Imaging  CT abdomen and pelvis  Cardiology Data  EKG  Other Data    Scheduled Meds:  atenolol  50 mg Oral Daily   Chlorhexidine Gluconate Cloth  6 each Topical Q0600   insulin aspart  0-15 Units Subcutaneous TID WC   insulin aspart  0-5 Units Subcutaneous QHS   mupirocin ointment  1 application Nasal BID   PHENobarbital  97.2 mg Oral BID   Continuous Infusions:  sodium chloride 50 mL/hr at 04/22/21 1140   heparin 1,600 Units/hr (04/22/21 1513)    sodium bicarbonate (isotonic) infusion in sterile water 50 mL/hr at 04/22/21 0915    Principal Problem:   AKI (acute kidney injury) ([place]) Active Problems:   Acute pulmonary embolism (HCC)   Acute deep vein thrombosis (DVT) of right lower extremity (HCC)   Unspecified atrial fibrillation (HCC)   HTN (hypertension)   Seizure disorder (HCC)   LOS: 4 days   A & P  AKI, obstructive uropathy, bilateral hydronephrosis: Foley catheter is in place.  S/p bladder biopsy with fulguration today. Plan to transfer to [facility] today for nephrostomy. Case discussed with Dr. Rica Records and he said no indication for IV fluids at this time. Creatinine is 16.58 today. Bilateral nephrostomy tubes placed today. Monitor creatinine and for post-obstructive diuresis. Strict I's and O's.    Coumadin coagulopathy: INR was 8.5 on admission.  S/p IV vitamin K.  INR is down to 4.1.  Last creatinine was 0.85 on EMR in April 2020. INR 2.4 last night and 1.8 this morning. Heparin drip resumed following procedure.   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 Vitamin K given to normalize INR for nephrostomy tube placement. Heparin drip has been restarted following  nephrostomy tube placement.   Hypertension: Continue atenolol  Type II DM: NovoLog as needed for hyperglycemia  Seizure disorder: Continue phenobarbital  I have seen and examined this patient myself. I have spent 32 minutes in her evaluation and care.  DVT prophylaxis: Heparin gtt CODE STATUS: Full Code Family Communication: Family at bedside Disposition: TBD   Maddy Graham, DO [facility] Direct contact: see [email or web address]  7PM-7AM contact night coverage as above 04/22/2021, 7:05 PM  LOS: 2 days

## 2021-04-22 NOTE — Progress Notes (Signed)
Pt called out needing to be cleaned up, RN and Kacey NT went into the room and cleaned up the patient and changed her sheets. Pt later notified Kacey, [place] that she believes that her phone was in the sheets that we took off and sent to laundry. [place], [place] called laundry and spoke with them, they stated that they has already sent the sheets over to [place] and would notify them and if and when they do find it they would send it back. RN spoke with the patient and explained to her the situation patient was very pleasant and understanding.

## 2021-04-22 NOTE — Progress Notes (Signed)
Referring Physician(s):McKenzie,P  Supervising Physician: Daryll Brod  Patient Status:  [facility] - In-pt  Chief Complaint:  bilateral hydronephrosis seen on CT 04/18/21, s/p bilateral PCN placement with IR on 04/21/21.   Subjective:  Pt laying in bed NAD.  Reports left sided back pain, no complaints with the PCNs.  Allergies: Patient has no known allergies.  Medications: Prior to Admission medications   Medication Sig Start Date End Date Taking? Authorizing Provider  atenolol (TENORMIN) 50 MG tablet Take 50 mg by mouth daily.   Yes [provider]  cholecalciferol (VITAMIN D3) 25 MCG (1000 UT) tablet Take 1,000 Units by mouth daily.   Yes [provider]  hydrochlorothiazide (HYDRODIURIL) 12.5 MG tablet Take 12.5 mg by mouth daily. 03/30/21  Yes [provider]  hydrocortisone 2.5 % cream Apply 1 application topically 2 (two) times daily. 04/11/21  Yes [provider]  megestrol (MEGACE) 40 MG tablet Take 40 mg by mouth daily. 04/12/21  Yes [provider]  metFORMIN (GLUCOPHAGE-XR) 500 MG 24 hr tablet Take 500 mg by mouth 2 (two) times daily. 03/30/21  Yes [provider]  oxybutynin (DITROPAN-XL) 10 MG 24 hr tablet Take 10 mg by mouth daily. 04/09/21  Yes [provider]  PHENobarbital (LUMINAL) 97.2 MG tablet Take 97.2 mg by mouth 2 (two) times daily.   Yes [provider]  warfarin (COUMADIN) 5 MG tablet TAKE 2-3 TABLETS BY MOUTH ONCE DAILY AS DIRECTED BY COUMADIN CLINIC 01/11/21  Yes Branch, Alphonse Guild, MD     Vital Signs: BP 129/84 (BP Location: Right Arm)    Pulse 90    Temp 97.9 F (36.6 C) (Oral)    Resp 19    Wt (!) 301 lb (136.5 kg)    SpO2 100%    BMI 55.05 kg/m   Physical Exam Vitals reviewed.  Constitutional:      General: She is not in acute distress.    Appearance: Normal appearance. She is obese. She is not ill-appearing.  HENT:     Head: Normocephalic and atraumatic.  Pulmonary:      Effort: Pulmonary effort is normal.  Abdominal:     General: Abdomen is flat.     Palpations: Abdomen is soft.  Skin:    General: Skin is warm and dry.     Coloration: Skin is not jaundiced or pale.     Comments: Positive bilateral flank drains drain to gravity bags. Sites are unremarkable with no erythema, edema, tenderness, bleeding or drainage. Suture and stat lock in place. Dressing is clean, dry, and intact. 50~60 ml of  clear, blood tinged urine noted in the bags.    Neurological:     Mental Status: She is alert and oriented to person, place, and time.  Psychiatric:        Behavior: Behavior normal.    Imaging: CT ABDOMEN PELVIS WO CONTRAST  Result Date: 04/18/2021 CLINICAL DATA:  Generalized abdominal pain and bloating for 1 month, vaginal bleeding EXAM: CT ABDOMEN AND PELVIS WITHOUT CONTRAST TECHNIQUE: Multidetector CT imaging of the abdomen and pelvis was performed following the standard protocol without IV contrast. COMPARISON:  None. FINDINGS: Lower chest: No acute pleural or parenchymal lung disease. Hepatobiliary: High attenuation material dependently within the gallbladder may reflect a noncalcified gallstone or biliary sludge. No evidence of acute cholecystitis. Unenhanced imaging of the liver is unremarkable. Pancreas: Unremarkable unenhanced appearance. Spleen: Unremarkable unenhanced appearance. Adrenals/Urinary Tract: Bilateral hydronephrosis and hydroureter, without clear cause identified for obstruction. No  ureteral or bladder calculi. The bladder is decompressed with a Foley catheter. Calcifications at the renal hila are likely vascular. Simple appearing cyst lower pole left kidney. The adrenals are unremarkable. Stomach/Bowel: No bowel obstruction or ileus. Normal appendix right lower quadrant. No bowel wall thickening or inflammatory change. Vascular/Lymphatic: Mild diffuse atherosclerosis of the aorta and its branches. Evaluation of the vascular lumen is limited without IV  contrast. There are multiple enlarged retroperitoneal lymph nodes. Index lymph node in the left para-aortic region image 38/2 measures 14 mm in short axis. Left external iliac chain lymph node demonstrates increased density, measuring 17 mm reference image 66/2. Reproductive: The uterus is enlarged, with minimal high attenuation within the endometrial cavity which could reflect calcification or blood products. If further evaluation of the uterus is desired, pelvic ultrasound could be performed. There are no adnexal masses. Other: No free intra-abdominal fluid or free gas. No abdominal wall hernia. Musculoskeletal: Symmetrical sclerosis along the sacroiliac joints. Changes of osteitis pubis. No acute displaced fracture. Reconstructed images demonstrate no additional findings. IMPRESSION: 1. Bilateral hydronephrosis and hydroureter, without clear cause for obstruction. No urinary tract calculi. 2. Enlarged uterus, with minimal high attenuation within the endometrial cavity, which could reflect blood products. Given clinical history of vaginal bleeding, follow-up pelvic ultrasound may be useful. 3. Retroperitoneal lymphadenopathy. 4. High attenuation material dependently within the gallbladder, consistent with noncalcified gallstones or sludge. No evidence of acute cholecystitis. 5.  Aortic Atherosclerosis (ICD10-I70.0). Electronically Signed   By: Randa Ngo M.D.   On: 04/18/2021 21:06   DG C-Arm 1-60 Min-No Report  Result Date: 04/19/2021 Fluoroscopy was utilized by the requesting physician.  No radiographic interpretation.   US PELVIC COMPLETE WITH TRANSVAGINAL  Result Date: 04/19/2021 CLINICAL DATA:  Uterine mass?  , History of vaginal bleeding. EXAM: TRANSABDOMINAL AND TRANSVAGINAL ULTRASOUND OF PELVIS TECHNIQUE: Both transabdominal and transvaginal ultrasound examinations of the pelvis were performed. Transabdominal technique was performed for global imaging of the pelvis including uterus, ovaries,  adnexal regions, and pelvic cul-de-sac. It was necessary to proceed with endovaginal exam following the transabdominal exam to visualize the endometrium. COMPARISON:  CT examination dated April 18, 2021 FINDINGS: Uterus Measurements: 13.0 x 5.3 x 7.1 = volume: 238 mL. No fibroids or other mass visualized. Evaluation is however limited due to body habitus. Patient was not able to tolerate transvaginal examination. Endometrium Thickness: 9.7 mm.  No focal abnormality visualized. Right ovary Not visualized Left ovary Not visualized Other findings No abnormal free fluid. IMPRESSION: Heterogeneous uterus without appreciable mass. Prominent endometrium in this post menopausal woman. Further evaluation with MRI examination or direct visualization is recommended. Bilateral ovaries were not seen. Exam is limited due to patient's inability to tolerate transvaginal examination. Electronically Signed   By: Keane Police D.O.   On: 04/19/2021 11:06   IR NEPHROSTOMY PLACEMENT LEFT  Result Date: 04/21/2021 INDICATION: Renal failure, bilateral obstructive hydronephrosis EXAM: ULTRASOUND FLUOROSCOPIC BILATERAL 10 FRENCH NEPHROSTOMIES COMPARISON:  04/18/2021 MEDICATIONS: 1% LIDOCAINE LOCAL ANESTHESIA/SEDATION: Moderate (conscious) sedation was employed during this procedure. A total of Versed 1.0 mg and Fentanyl 25 mcg was administered intravenously by the radiology nurse. Total intra-service moderate Sedation Time: 20 minutes. The patient's level of consciousness and vital signs were monitored continuously by radiology nursing throughout the procedure under my direct supervision. CONTRAST:  10 cc-administered into the collecting system(s) FLUOROSCOPY TIME:  Fluoroscopy Time: 1 minutes 6 seconds (22 mGy). COMPLICATIONS: None immediate. PROCEDURE: Informed written consent was obtained from the patient after a thorough discussion of the  procedural risks, benefits and alternatives. All questions were addressed. Maximal Sterile  Barrier Technique was utilized including caps, mask, sterile gowns, sterile gloves, sterile drape, hand hygiene and skin antiseptic. A timeout was performed prior to the initiation of the procedure. Previous imaging reviewed. Patient positioned prone. Ultrasound localization performed to locate the hydronephrotic kidneys. Overlying skin marked. Under sterile condition and local anesthesia, ultrasound percutaneous needle access performed of mid to lower pole dilated calices bilaterally with 18 gauge 15 cm needles. There was return of urine bilaterally. Over Amplatz guidewires, tract dilatation performed to insert bilateral 10 French nephrostomies. Retention loop formed in the renal pelvis. Contrast injection confirms position of the nephrostomies. Catheter secured with Prolene sutures and connected to external gravity drainage bag. Sterile dressing applied. No immediate complication. Patient tolerated the procedure well. IMPRESSION: Successful bilateral ultrasound and fluoroscopic 10 French nephrostomies Electronically Signed   By: Jerilynn Mages.  Shick M.D.   On: 04/21/2021 12:53   IR NEPHROSTOMY PLACEMENT RIGHT  Result Date: 04/21/2021 INDICATION: Renal failure, bilateral obstructive hydronephrosis EXAM: ULTRASOUND FLUOROSCOPIC BILATERAL 10 FRENCH NEPHROSTOMIES COMPARISON:  04/18/2021 MEDICATIONS: 1% LIDOCAINE LOCAL ANESTHESIA/SEDATION: Moderate (conscious) sedation was employed during this procedure. A total of Versed 1.0 mg and Fentanyl 25 mcg was administered intravenously by the radiology nurse. Total intra-service moderate Sedation Time: 20 minutes. The patient's level of consciousness and vital signs were monitored continuously by radiology nursing throughout the procedure under my direct supervision. CONTRAST:  10 cc-administered into the collecting system(s) FLUOROSCOPY TIME:  Fluoroscopy Time: 1 minutes 6 seconds (22 mGy). COMPLICATIONS: None immediate. PROCEDURE: Informed written consent was obtained from the  patient after a thorough discussion of the procedural risks, benefits and alternatives. All questions were addressed. Maximal Sterile Barrier Technique was utilized including caps, mask, sterile gowns, sterile gloves, sterile drape, hand hygiene and skin antiseptic. A timeout was performed prior to the initiation of the procedure. Previous imaging reviewed. Patient positioned prone. Ultrasound localization performed to locate the hydronephrotic kidneys. Overlying skin marked. Under sterile condition and local anesthesia, ultrasound percutaneous needle access performed of mid to lower pole dilated calices bilaterally with 18 gauge 15 cm needles. There was return of urine bilaterally. Over Amplatz guidewires, tract dilatation performed to insert bilateral 10 French nephrostomies. Retention loop formed in the renal pelvis. Contrast injection confirms position of the nephrostomies. Catheter secured with Prolene sutures and connected to external gravity drainage bag. Sterile dressing applied. No immediate complication. Patient tolerated the procedure well. IMPRESSION: Successful bilateral ultrasound and fluoroscopic 10 French nephrostomies Electronically Signed   By: Jerilynn Mages.  Shick M.D.   On: 04/21/2021 12:53    Labs:  CBC: Recent Labs    04/19/21 0359 04/20/21 0037 04/21/21 0534 04/22/21 0414  WBC 11.8* 12.7* 11.2* 11.5*  HGB 8.4* 8.2* 7.4* 7.7*  HCT 26.0* 24.9* 23.2* 23.8*  PLT 360 345 342 387    COAGS: Recent Labs    04/20/21 0037 04/20/21 1100 04/21/21 0534 04/22/21 0414  INR 2.4* 1.8* 1.4* 1.3*    BMP: Recent Labs    04/20/21 0037 04/21/21 0534 04/21/21 2319 04/22/21 0414  NA 136 131* 141 141  K 4.3 4.0 3.9 3.7  CL 106 105 109 108  CO2 15* 11* 16* 15*  GLUCOSE 99 94 107* 97  BUN 106* 113* 108* 104*  CALCIUM 8.3* 8.1* 7.8* 8.4*  CREATININE 15.28* 16.58* 14.54* 14.29*  GFRNONAA 2* 2* 3* 3*    LIVER FUNCTION TESTS: Recent Labs    04/18/21 1718  BILITOT 0.2*  AST 44*  ALT 28   ALKPHOS 83  PROT 7.2  ALBUMIN 2.8*    Assessment and Plan:  61 y.o. female with bilateral hydronephrosis seen on CT 04/18/21, s/p bilateral PCN placement with IR on 04/21/21.   Hgb stable  VSS RF stable  Good Op from both PCNs  Bilateral puncture sites showed no s/s of acute bleeding or infection.    Further treatment plan per [facility]/Urology Appreciate and agree with the plan.  Please call IR for questions and concerns.    Electronically Signed: Tera Mater, PA-C 04/22/2021, 10:33 AM   I spent a total of 15 Minutes at the the patient's bedside AND on the patient's hospital floor or unit, greater than 50% of which was counseling/coordinating care for bilateral PCNs.   This chart was dictated using voice recognition software.  Despite best efforts to proofread,  errors can occur which can change the documentation meaning.

## 2021-04-22 NOTE — Progress Notes (Signed)
ANTICOAGULATION CONSULT NOTE - Initial Consult  Pharmacy Consult for IV Heparin Indication:  Hx DVT/PE and malignancy  No Known Allergies  Patient Measurements: Weight: (!) 136.5 kg (301 lb) Ideal body weight 50 kg Heparin Dosing Weight: 84.7 kg  Vital Signs: Temp: 98.9 F (37.2 C) (01/08 1619) Temp Source: Oral (01/08 0759) BP: 101/73 (01/08 1619) Pulse Rate: 89 (01/08 1619)  Labs: Recent Labs    04/20/21 0037 04/20/21 0037 04/20/21 1100 04/21/21 0534 04/21/21 2319 04/22/21 0414 04/22/21 1449  HGB 8.2*  --   --  7.4*  --  7.7*  --   HCT 24.9*  --   --  23.2*  --  23.8*  --   PLT 345  --   --  342  --  387  --   LABPROT 26.5*  --  21.0* 17.2*  --  16.2*  --   INR 2.4*  --  1.8* 1.4*  --  1.3*  --   HEPARINUNFRC  --    < >  --  0.12* >1.10* 0.42 0.45  CREATININE 15.28*  --   --  16.58* 14.54* 14.29*  --    < > = values in this interval not displayed.     Estimated Creatinine Clearance: 5.5 mL/min (A) (by C-G formula based on SCr of 14.29 mg/dL (H)).   Medical History: Past Medical History:  Diagnosis Date   Hypertension    Seizures ([place])     Assessment: 61 years of age female on warfarin prior to admission who was admitted with INR of 8.5. Patient now s/p several doses of Vitamin K 2mg  po and INR is down to 1.3. MD consulted pharmacy to start IV heparin due to high clot risk with history of DVT/PE and malignancy.   Confirmatory heparin level continues to be at goal (0.45) on 1600 units/hr. No bleeding or IV issues noted. Hgb low but stable overnight.   Goal of Therapy:  Heparin level 0.3-0.7 units/ml Monitor platelets by anticoagulation protocol: Yes   Plan:  Continue IV Heparin at 1600 units/hr. Daily heparin level and cbc  Erin Hearing PharmD., BCPS Clinical Pharmacist 04/22/2021 4:58 PM

## 2021-04-22 NOTE — Progress Notes (Signed)
[facility] Progress Note   61 y.o. 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  She reports that she has been having vaginal bleeding for the past 2 months and they thought it was due to supratherapeutic INR and has been adjusting her coumadin.  In the ED labs were notable for INR 8.5 but she also had a BUN 108, Cr 14.34, Co2 15, alb 2.8, WBC 11.4, Hgb 8.5.  A CT scan of abdomen and pelvis without contrast was ordered and revealed bilateral hydronephrosis and hydroureter without clear cause of obstruction, no stones, an enlarged uterus, and retroperitoneal lymphadenopathy.  We were consulted to further evaluate her AKI.  She has not had a baseline Scr in the EMR since 2020. Little UOP.    Assessment/ Plan:    ARF - due to obstructive uropathy with bilateral hydronephrosis and hydroureter.  CT scan with enlarged uterus and retroperitoneal lymphadenopathy.  Foley catheter placed with minimal UOP.  Here for bilateral percutaneous nephrostomy tube placement by IR.  No indication for dialysis at this time.  Will follow renal function after obstruction is relieved and hopefully will get full recovery, although it sounds like this has been going on for some time. She states that she has had issues with urination for a mth but had outpatient labs 2 weeks ago but did not know results. - If early then renal function should quickly improve after relief of obstruction; b/l PCN 10Fr on 1/7 by VIR. Had been delayed bec of elevated INR . - Continue isotonic HCO3 IV at 66ml/hr for now and should be able to stop in the next 24-48hrs. - Very good output after PCN's placed which is promising; lt PCN much more output than the rt PCN. Rt PCN almost more pink tinged. Will cont monitoring. - No absolute indication for RRT and hopefully will be able to  avoid.  -Monitor Daily I/Os, Daily weight  -Maintain MAP>65 for optimal renal perfusion.  -Avoid nephrotoxic medications including NSAIDs - Dose meds for GFR <23ml/min for now  Obstructive uropathy -  bilateral PCN by IR on 1/7 Coagulopathy - had to be treated with  vitamin K . Uterine mass with vaginal bleeding - concerning for malignancy given retroperitoneal lymphadenopathy. 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  Anemia - normocytic and likely due to chronic vaginal bleeding for past 2 months.  Transfuse prn.  Atrial fibrillation - rate controlled.  Anticoagulation on hold as above.  Anion gap metabolic acidosis - due to ARF.  Follow after PCN and will add isotonic HCO3 IV at 77ml/hr today.    Subjective:   Mild abd discomfort but denies nausea, fever, chills, dyspnea. Has a good appetite.   Objective:   BP 129/84 (BP Location: Right Arm)    Pulse 90    Temp 97.9 F (36.6 C) (Oral)    Resp 19    Wt (!) 136.5 kg    SpO2 100%    BMI 55.05 kg/m   Intake/Output Summary (Last 24 hours) at 04/22/2021 0828 Last data filed at 04/22/2021 0804 Gross per 24 hour  Intake 0 ml  Output 6775 ml  Net -6775 ml   Weight change:   Physical Exam: General appearance: alert, cooperative, no distress, and morbidly obese Head: NCAT Resp: CTA  b/l Cardio: RRR GI: S, mild distention but no rebound or guarding +BS Extremities: edema chronic lower extremity edema (R>L)  Imaging: IR NEPHROSTOMY PLACEMENT LEFT  Result Date: 04/21/2021 INDICATION: Renal failure, bilateral obstructive hydronephrosis EXAM: ULTRASOUND FLUOROSCOPIC BILATERAL 10 FRENCH NEPHROSTOMIES COMPARISON:  04/18/2021 MEDICATIONS: 1% LIDOCAINE LOCAL ANESTHESIA/SEDATION: Moderate (conscious) sedation was employed during this procedure. A total of Versed 1.0 mg and Fentanyl 25 mcg was administered intravenously by the radiology nurse. Total intra-service moderate  Sedation Time: 20 minutes. The patient's level of consciousness and vital signs were monitored continuously by radiology nursing throughout the procedure under my direct supervision. CONTRAST:  10 cc-administered into the collecting system(s) FLUOROSCOPY TIME:  Fluoroscopy Time: 1 minutes 6 seconds (22 mGy). COMPLICATIONS: None immediate. PROCEDURE: Informed written consent was obtained from the patient after a thorough discussion of the procedural risks, benefits and alternatives. All questions were addressed. Maximal Sterile Barrier Technique was utilized including caps, mask, sterile gowns, sterile gloves, sterile drape, hand hygiene and skin antiseptic. A timeout was performed prior to the initiation of the procedure. Previous imaging reviewed. Patient positioned prone. Ultrasound localization performed to locate the hydronephrotic kidneys. Overlying skin marked. Under sterile condition and local anesthesia, ultrasound percutaneous needle access performed of mid to lower pole dilated calices bilaterally with 18 gauge 15 cm needles. There was return of urine bilaterally. Over Amplatz guidewires, tract dilatation performed to insert bilateral 10 French nephrostomies. Retention loop formed in the renal pelvis. Contrast injection confirms position of the nephrostomies. Catheter secured with Prolene sutures and connected to external gravity drainage bag. Sterile dressing applied. No immediate complication. Patient tolerated the procedure well. IMPRESSION: Successful bilateral ultrasound and fluoroscopic 10 French nephrostomies Electronically Signed   By: Jerilynn Mages.  Shick M.D.   On: 04/21/2021 12:53   IR NEPHROSTOMY PLACEMENT RIGHT  Result Date: 04/21/2021 INDICATION: Renal failure, bilateral obstructive hydronephrosis EXAM: ULTRASOUND FLUOROSCOPIC BILATERAL 10 FRENCH NEPHROSTOMIES COMPARISON:  04/18/2021 MEDICATIONS: 1% LIDOCAINE LOCAL ANESTHESIA/SEDATION: Moderate (conscious) sedation was employed during this procedure.  A total of Versed 1.0 mg and Fentanyl 25 mcg was administered intravenously by the radiology nurse. Total intra-service moderate Sedation Time: 20 minutes. The patient's level of consciousness and vital signs were monitored continuously by radiology nursing throughout the procedure under my direct supervision. CONTRAST:  10 cc-administered into the collecting system(s) FLUOROSCOPY TIME:  Fluoroscopy Time: 1 minutes 6 seconds (22 mGy). COMPLICATIONS: None immediate. PROCEDURE: Informed written consent was obtained from the patient after a thorough discussion of the procedural risks, benefits and alternatives. All questions were addressed. Maximal Sterile Barrier Technique was utilized including caps, mask, sterile gowns, sterile gloves, sterile drape, hand hygiene and skin antiseptic. A timeout was performed prior to the initiation of the procedure. Previous imaging reviewed. Patient positioned prone. Ultrasound localization performed to locate the hydronephrotic kidneys. Overlying skin marked. Under sterile condition and local anesthesia, ultrasound percutaneous needle access performed of mid to lower pole dilated calices bilaterally with 18 gauge 15 cm needles. There was return of urine bilaterally. Over Amplatz guidewires, tract dilatation performed to insert bilateral 10 French nephrostomies. Retention loop formed in the renal pelvis. Contrast injection confirms position of the nephrostomies. Catheter secured with Prolene sutures and connected to external gravity drainage bag. Sterile dressing applied. No immediate complication. Patient tolerated the procedure well. IMPRESSION: Successful bilateral ultrasound and fluoroscopic 10 French nephrostomies Electronically Signed   By: Jerilynn Mages.  Shick M.D.   On: 04/21/2021 12:53    Labs: BMET Recent Labs  Lab 04/18/21 1718 04/19/21 0359 04/20/21 0037 04/21/21  9357 04/21/21 2319 04/22/21 0414  NA 139 137 136 131* 141 141  K 3.8 3.7 4.3 4.0 3.9 3.7  CL 107 107 106  105 109 108  CO2 15* 13* 15* 11* 16* 15*  GLUCOSE 120* 93 99 94 107* 97  BUN 108* 106* 106* 113* 108* 104*  CREATININE 14.34* 14.74* 15.28* 16.58* 14.54* 14.29*  CALCIUM 8.1* 8.0* 8.3* 8.1* 7.8* 8.4*   CBC Recent Labs  Lab 04/18/21 1718 04/19/21 0359 04/20/21 0037 04/21/21 0534 04/22/21 0414  WBC 11.4* 11.8* 12.7* 11.2* 11.5*  NEUTROABS 8.5*  --   --   --   --   HGB 8.5* 8.4* 8.2* 7.4* 7.7*  HCT 27.0* 26.0* 24.9* 23.2* 23.8*  MCV 92.5 92.9 91.5 91.7 90.2  PLT 383 360 345 342 387    Medications:     atenolol  50 mg Oral Daily   Chlorhexidine Gluconate Cloth  6 each Topical Q0600   insulin aspart  0-15 Units Subcutaneous TID WC   insulin aspart  0-5 Units Subcutaneous QHS   mupirocin ointment  1 application Nasal BID   PHENobarbital  97.2 mg Oral BID      Otelia Santee, MD 04/22/2021, 8:28 AM

## 2021-04-23 DIAGNOSIS — N179 Acute kidney failure, unspecified: Secondary | ICD-10-CM | POA: Diagnosis not present

## 2021-04-23 LAB — BASIC METABOLIC PANEL
Anion gap: 16 — ABNORMAL HIGH (ref 5–15)
BUN: 83 mg/dL — ABNORMAL HIGH (ref 8–23)
CO2: 21 mmol/L — ABNORMAL LOW (ref 22–32)
Calcium: 8.5 mg/dL — ABNORMAL LOW (ref 8.9–10.3)
Chloride: 107 mmol/L (ref 98–111)
Creatinine, Ser: 10.29 mg/dL — ABNORMAL HIGH (ref 0.44–1.00)
GFR, Estimated: 4 mL/min — ABNORMAL LOW (ref >60)
Glucose, Bld: 90 mg/dL (ref 70–99)
Potassium: 3.2 mmol/L — ABNORMAL LOW (ref 3.5–5.1)
Sodium: 144 mmol/L (ref 135–145)

## 2021-04-23 LAB — CBC
HCT: 25.4 % — ABNORMAL LOW (ref 36.0–46.0)
Hemoglobin: 8.2 g/dL — ABNORMAL LOW (ref 12.0–15.0)
MCH: 29.4 pg (ref 26.0–34.0)
MCHC: 32.3 g/dL (ref 30.0–36.0)
MCV: 91 fL (ref 80.0–100.0)
Platelets: 403 10*3/uL — ABNORMAL HIGH (ref 150–400)
RBC: 2.79 MIL/uL — ABNORMAL LOW (ref 3.87–5.11)
RDW: 14.1 % (ref 11.5–15.5)
WBC: 11.2 10*3/uL — ABNORMAL HIGH (ref 4.0–10.5)
nRBC: 0 % (ref 0.0–0.2)

## 2021-04-23 LAB — GLUCOSE, CAPILLARY
Glucose-Capillary: 115 mg/dL — ABNORMAL HIGH (ref 70–99)
Glucose-Capillary: 91 mg/dL (ref 70–99)
Glucose-Capillary: 101 mg/dL — ABNORMAL HIGH (ref 70–99)
Glucose-Capillary: 107 mg/dL — ABNORMAL HIGH (ref 70–99)

## 2021-04-23 LAB — HEPARIN LEVEL (UNFRACTIONATED): Heparin Unfractionated: 0.51 IU/mL (ref 0.30–0.70)

## 2021-04-23 LAB — PROTIME-INR
INR: 1.3 — ABNORMAL HIGH (ref 0.8–1.2)
Prothrombin Time: 16.1 seconds — ABNORMAL HIGH (ref 11.4–15.2)

## 2021-04-23 MED ORDER — POTASSIUM CHLORIDE CRYS ER 20 MEQ PO TBCR: 40.0000 meq | EXTENDED_RELEASE_TABLET | ORAL | Status: DC

## 2021-04-23 MED ORDER — POTASSIUM CHLORIDE CRYS ER 20 MEQ PO TBCR
40.0000 meq | EXTENDED_RELEASE_TABLET | Freq: Once | ORAL | Status: AC
  Administered 2021-04-23: 40 meq via ORAL
  Filled 2021-04-23: qty 2

## 2021-04-23 NOTE — Progress Notes (Signed)
Mobility Specialist Progress Note:   04/23/21 1600  Mobility  Activity Transferred to/from [facility]  Level of Assistance Standby assist, set-up cues, supervision of patient - no hands on  Assistive Device None  Distance Ambulated (ft) 2 ft  Mobility Out of bed for toileting  Mobility Response Tolerated well  Mobility performed by Mobility specialist  $Mobility charge 1 Mobility   Pt requesting to use BR before session. Instructed to call when finished on BSC.   Nelta Numbers Mobility Specialist  Phone [phone]

## 2021-04-23 NOTE — Progress Notes (Signed)
Mobility Specialist Progress Note:   04/23/21 1620  Mobility  Activity Ambulated in room  Level of Assistance Contact guard assist, steadying assist  Assistive Device Front wheel walker  Distance Ambulated (ft) 30 ft  Mobility Ambulated with assistance in room  Mobility Response Tolerated well  Mobility performed by Mobility specialist  $Mobility charge 1 Mobility   Pt eager for ambulation. Unsteady gait this afternoon, requiring RW and contactG to correct. Pt back in bed with all needs met.   Nelta Numbers Mobility Specialist  Phone [phone]

## 2021-04-23 NOTE — Progress Notes (Addendum)
Drain Location: Bilat PCNs Size: Fr size: 10 Fr Date of placement: 04/21/21  Currently to: Drain collection device: gravity 24 hour output:  Output by Drain (mL) 04/21/21 0701 - 04/21/21 1900 04/21/21 1901 - 04/22/21 0700 04/22/21 0701 - 04/22/21 1900 04/22/21 1901 - 04/23/21 0700 04/23/21 0701 - 04/23/21 1044  Closed System Drain 1 Left;Posterior Back 10 Fr. 1400 3600 3040 2050   Closed System Drain Right;Posterior Back 10 Fr. 200 700 410 575     Interval imaging/drain manipulation:  None  Current examination:   Insertion site unremarkable. Suture and stat lock in place. Dressed appropriately.  OP Right - still sl blood tinged Left- clear yellow urine  Hg at 8.2 today  Plan:  Record output Q shift. Dressing changes QD or PRN if soiled.  Call IR APP or on call IR MD if sudden change in drain output.  Plan per Urology- Dr Augustin Coupe

## 2021-04-23 NOTE — Progress Notes (Signed)
[facility] Progress Note    Assessment/ Plan:    ARF - due to obstructive uropathy with bilateral hydronephrosis and hydroureter.  CT scan with enlarged uterus and retroperitoneal lymphadenopathy.  Foley catheter placed with minimal UOP.  Here for bilateral percutaneous nephrostomy tube placement by IR.  No indication for dialysis at this time.  Will follow renal function after obstruction is relieved and hopefully will get full recovery, although it sounds like this has been going on for some time. She states that she has had issues with urination for a mth but had outpatient labs 2 weeks ago but did not know results. - If early then renal function should quickly improve after relief of obstruction; b/l PCN 10Fr on 1/7 by VIR.  - stop bicarb - Very good output after PCN's placed  - no need for dialysis at this time - on NS as well- having post-obstructive diuresis, watch Na- may need to switch to 1/2 NS   -Monitor Daily I/Os, Daily weight  -Maintain MAP>65 for optimal renal perfusion.  -Avoid nephrotoxic medications including NSAIDs - Dose meds for GFR <82ml/min for now  Obstructive uropathy -  bilateral PCN by IR on 1/7 Coagulopathy - had to be treated with  vitamin K . Uterine mass with vaginal bleeding - concerning for malignancy given retroperitoneal lymphadenopathy. 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  Anemia - normocytic and likely due to chronic vaginal bleeding for past 2 months.  Transfuse prn.  Atrial fibrillation - rate controlled.  Anticoagulation on hold as above.  Anion gap metabolic acidosis - due to ARF, resolving    Subjective:   Mild abd discomfort but denies nausea, fever, chills, dyspnea. Has a good appetite.   Objective:   BP 123/63 (BP Location: Left Arm)    Pulse 82    Temp 99 F (37.2 C) (Oral)    Resp 18    Wt (!) 136.5 kg    SpO2 99%    BMI 55.05 kg/m    Intake/Output Summary (Last 24 hours) at 04/23/2021 1253 Last data filed at 04/23/2021 1243 Gross per 24 hour  Intake 2048.1 ml  Output 5575 ml  Net -3526.9 ml   Weight change:   Physical Exam: General appearance: alert, cooperative, no distress, and morbidly obese Head: NCAT Resp: CTA b/l Cardio: RRR GU: bilateral PCNs with pink-tinged urine Extremities: edema chronic lower extremity edema (R>L)  Imaging: No results found.  Labs: BMET Recent Labs  Lab 04/18/21 1718 04/19/21 0359 04/20/21 0037 04/21/21 0534 04/21/21 2319 04/22/21 0414 04/23/21 0213  NA 139 137 136 131* 141 141 144  K 3.8 3.7 4.3 4.0 3.9 3.7 3.2*  CL 107 107 106 105 109 108 107  CO2 15* 13* 15* 11* 16* 15* 21*  GLUCOSE 120* 93 99 94 107* 97 90  BUN 108* 106* 106* 113* 108* 104* 83*  CREATININE 14.34* 14.74* 15.28* 16.58* 14.54* 14.29* 10.29*  CALCIUM 8.1* 8.0* 8.3* 8.1* 7.8* 8.4* 8.5*   CBC Recent Labs  Lab 04/18/21 1718 04/19/21 0359 04/20/21 0037 04/21/21 0534 04/22/21 0414 04/23/21 0213  WBC 11.4*   < > 12.7* 11.2* 11.5* 11.2*  NEUTROABS 8.5*  --   --   --   --   --   HGB 8.5*   < > 8.2* 7.4* 7.7* 8.2*  HCT 27.0*   < > 24.9* 23.2* 23.8* 25.4*  MCV 92.5   < >  91.5 91.7 90.2 91.0  PLT 383   < > 345 342 387 403*   < > = values in this interval not displayed.    Medications:     atenolol  50 mg Oral Daily   Chlorhexidine Gluconate Cloth  6 each Topical Q0600   insulin aspart  0-15 Units Subcutaneous TID WC   insulin aspart  0-5 Units Subcutaneous QHS   mupirocin ointment  1 application Nasal BID   PHENobarbital  97.2 mg Oral BID      Madelon Lips MD 04/23/2021, 12:53 PM

## 2021-04-23 NOTE — Progress Notes (Signed)
Mobility Specialist Progress Note:   04/23/21 1100  Mobility  Activity Transferred to/from [facility]  Level of Assistance Standby assist, set-up cues, supervision of patient - no hands on  Assistive Device None  Distance Ambulated (ft) 2 ft  Mobility Out of bed for toileting  Mobility Response Tolerated well  Mobility performed by Mobility specialist  $Mobility charge 1 Mobility   Pt received on BSC, requesting to get back to bed. RN in to help with lines/drains. Pt back in bed with MD present.   Nelta Numbers Mobility Specialist  Phone [phone]

## 2021-04-23 NOTE — Progress Notes (Signed)
[facility] for IV Heparin Indication:  Hx DVT/PE and malignancy  No Known Allergies  Patient Measurements: Weight: (!) 136.5 kg (301 lb) Ideal body weight 50 kg Heparin Dosing Weight: 84.7 kg  Vital Signs: Temp: 98 F (36.7 C) (01/09 0512) Temp Source: Oral (01/08 1959) BP: 120/63 (01/09 0512) Pulse Rate: 84 (01/09 0512)  Labs: Recent Labs    04/21/21 0534 04/21/21 2319 04/22/21 0414 04/22/21 1449 04/23/21 0213  HGB 7.4*  --  7.7*  --  8.2*  HCT 23.2*  --  23.8*  --  25.4*  PLT 342  --  387  --  403*  LABPROT 17.2*  --  16.2*  --  16.1*  INR 1.4*  --  1.3*  --  1.3*  HEPARINUNFRC 0.12* >1.10* 0.42 0.45 0.51  CREATININE 16.58* 14.54* 14.29*  --  10.29*     Estimated Creatinine Clearance: 7.6 mL/min (A) (by C-G formula based on SCr of 10.29 mg/dL (H)).   Assessment: 61 years of age female on warfarin prior to admission who was admitted with INR of 8.5. Patient now s/p several doses of Vitamin K 2mg  po and INR is down to 1.3. MD consulted pharmacy to start IV heparin due to high clot risk with history of DVT/PE and malignancy. She is s/p bilateral nephrostomy tubes on 1/7.  Heparin level is therapeutic at 0.51 on 1600 units/hr. No bleeding noted. Hgb low stable, platelets are normal.   Goal of Therapy:  Heparin level 0.3-0.7 units/ml Monitor platelets by anticoagulation protocol: Yes   Plan:  Continue IV heparin drip at 1600 units/hr Daily heparin level and CBC Monitor for s/sx of bleeding  Thank you for involving pharmacy in this patient's care.  Renold Genta, PharmD, BCPS Clinical Pharmacist Clinical phone for 04/23/2021 until 3p is [phone] 04/23/2021 7:40 AM  **Pharmacist phone directory can be found on [email or web address] listed under [place]**

## 2021-04-23 NOTE — Progress Notes (Addendum)
PROGRESS NOTE  Debbie [name] Dado QIW:[id number] DOB: 05/29/1960 DOA: 04/18/2021 PCP: [facility] [place]  Brief History   Debbie [name] is a 61 y.o. female with medical history significant for seizure disorder, pulmonary embolism on warfarin, atrial fibrillation, hypertension, who was referred by the warfarin clinic to the emergency room for elevated INR.  The patient complained of a 27-month history of vaginal bleeding.  She was given megestrol prescription about 5 days prior to admission.  She also complained of generalized weakness, fatigue shortness of breath and 50 pound weight loss over a 34-month period.  At [facility] the patient was found to have a creatinine of 14.34. Imagery revealed hydronephrosis and a distended bladder and uterus, retroperitoneal lymphadenopathy. There also was gallbladder sludge.   Urology at [facility] performed cystoscopy with biopsies that discovered that the ureteral orifices could not be identified due to bladder mass. Lesions in the bladder were fulgrated. The patient was sent to [facility] for placement of bilateral percutaneous nephrostomy tubes. She will also require the services of gynecological oncology. Her anticoagulation has been held pending the IR procedure. It was planned for today, but could not be done today due to continued elevated INR. She will be given 2 mg IV Vitamin K.   The patient underwent bilateral nephrostomy tube placement by IR on 1.8.2023.   She has continued to require IV fluids due to high urinary output due to post-obstructive diuresis. Monitor creatinine and electrolytes.  Consultants  Interventional radiology Urology  Procedures  Cystoscopy with biopsies and fulgration  Antibiotics   Anti-infectives (From admission, onward)    Start     Dose/Rate Route Frequency Ordered Stop   04/21/21 1006  sodium chloride 0.9 % with cefTRIAXone (ROCEPHIN) ADS Med       Note to Pharmacy: Fredric Dine: cabinet override       04/21/21 1006 04/21/21 2214   04/21/21 1000  cefTRIAXone (ROCEPHIN) 2 g in sodium chloride 0.9 % 100 mL IVPB  Status:  Discontinued        2 g 200 mL/hr over 30 Minutes Intravenous On call 04/20/21 1827 04/20/21 1828   04/21/21 1000  cefTRIAXone (ROCEPHIN) 2 g in sodium chloride 0.9 % 100 mL IVPB        2 g 200 mL/hr over 30 Minutes Intravenous On call 04/20/21 1828 04/21/21 1228   04/21/21 0700  cefTRIAXone (ROCEPHIN) 2 g in sodium chloride 0.9 % 100 mL IVPB  Status:  Discontinued        2 g 200 mL/hr over 30 Minutes Intravenous On call 04/20/21 1656 04/20/21 1827   04/20/21 1400  cefTRIAXone (ROCEPHIN) 2 g in sodium chloride 0.9 % 100 mL IVPB  Status:  Discontinued        2 g 200 mL/hr over 30 Minutes Intravenous On call 04/20/21 0956 04/20/21 1656   04/19/21 1145  ceFAZolin (ANCEF) IVPB 3g/100 mL premix        3 g 200 mL/hr over 30 Minutes Intravenous  Once 04/19/21 1135 04/19/21 1250      Subjective  The patient is resting comfortably. No new complaints.   Objective   Vitals:  Vitals:   04/23/21 0755 04/23/21 1628  BP: 123/63 107/64  Pulse: 82 88  Resp: 18 19  Temp: 99 F (37.2 C) 98.2 F (36.8 C)  SpO2: 99% 100%    Exam:  Constitutional:  The patient is awake, alert, and oriented x 3. No acute distress. Respiratory:  No increased work of breathing.  No wheezes, rales, or rhonchi No tactile fremitus Cardiovascular:  Regular rate and rhythm No murmurs, ectopy, or gallups. No lateral PMI. No thrills. Abdomen:  Abdomen is soft, non-tender, non-distended No hernias, masses, or organomegaly Normoactive bowel sounds.  Musculoskeletal:  No cyanosis, clubbing, or edema Skin:  No rashes, lesions, ulcers palpation of skin: no induration or nodules Neurologic:  CN 2-12 intact Sensation all 4 extremities intact Psychiatric:  Mental status Mood, affect appropriate Orientation to person, place, time  judgment and insight appear intact  I have personally reviewed  the following:   Today's Data  Vitals  Lab Data  CBC BMP  Micro Data  MRSA screen positive for MRSA and staph aureus  Imaging  CT abdomen and pelvis  Cardiology Data  EKG  Other Data    Scheduled Meds:  atenolol  50 mg Oral Daily   Chlorhexidine Gluconate Cloth  6 each Topical Q0600   insulin aspart  0-15 Units Subcutaneous TID WC   insulin aspart  0-5 Units Subcutaneous QHS   mupirocin ointment  1 application Nasal BID   PHENobarbital  97.2 mg Oral BID   Continuous Infusions:  sodium chloride 75 mL/hr at 04/23/21 1343   heparin 1,600 Units/hr (04/23/21 0821)    Principal Problem:   AKI (acute kidney injury) ([place]) Active Problems:   Acute pulmonary embolism (HCC)   Acute deep vein thrombosis (DVT) of right lower extremity (HCC)   Unspecified atrial fibrillation (HCC)   HTN (hypertension)   Seizure disorder (HCC)   LOS: 5 days   A & P  AKI, obstructive uropathy, bilateral hydronephrosis: Foley catheter is in place.  S/p bladder biopsy with fulguration today. Plan to transfer to [facility] today for nephrostomy.  Creatinine is 16.58 prior to nephrostomy tube placement. Bilateral nephrostomy tubes placed on 1.8.2023. Pt has demonstrated post-obstructive diuresis. Continue IV fluids and to monitor creatinine and electrolytes.    Coumadin coagulopathy: INR was 8.5 on admission.  S/p IV vitamin K.  INR is down to 4.1.  Last creatinine was 0.85 on EMR in April 2020. INR 2.4 last night and 1.8 this morning. Heparin drip resumed following procedure.   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 Vitamin K given to normalize INR for nephrostomy tube placement. Heparin drip has been restarted following nephrostomy tube placement.   Hypertension: Continue atenolol  Type II DM: NovoLog as needed for  hyperglycemia  Seizure disorder: Continue phenobarbital  I have seen and examined this patient myself. I have spent 32 minutes in her evaluation and care.  DVT prophylaxis: Heparin gtt CODE STATUS: Full Code Family Communication: Family at bedside Disposition: TBD   Sarann Tregre, DO [facility] Direct contact: see [email or web address]  7PM-7AM contact night coverage as above 04/23/2021, 8:05 PM  LOS: 2 days

## 2021-04-24 DIAGNOSIS — I10 Essential (primary) hypertension: Secondary | ICD-10-CM | POA: Diagnosis not present

## 2021-04-24 DIAGNOSIS — G40909 Epilepsy, unspecified, not intractable, without status epilepticus: Secondary | ICD-10-CM

## 2021-04-24 DIAGNOSIS — N179 Acute kidney failure, unspecified: Secondary | ICD-10-CM | POA: Diagnosis not present

## 2021-04-24 LAB — GLUCOSE, CAPILLARY
Glucose-Capillary: 133 mg/dL — ABNORMAL HIGH (ref 70–99)
Glucose-Capillary: 100 mg/dL — ABNORMAL HIGH (ref 70–99)
Glucose-Capillary: 93 mg/dL (ref 70–99)
Glucose-Capillary: 125 mg/dL — ABNORMAL HIGH (ref 70–99)

## 2021-04-24 LAB — CBC
HCT: 25.1 % — ABNORMAL LOW (ref 36.0–46.0)
Hemoglobin: 7.9 g/dL — ABNORMAL LOW (ref 12.0–15.0)
MCH: 29.5 pg (ref 26.0–34.0)
MCHC: 31.5 g/dL (ref 30.0–36.0)
MCV: 93.7 fL (ref 80.0–100.0)
Platelets: 399 10*3/uL (ref 150–400)
RBC: 2.68 MIL/uL — ABNORMAL LOW (ref 3.87–5.11)
RDW: 14.5 % (ref 11.5–15.5)
WBC: 13.1 10*3/uL — ABNORMAL HIGH (ref 4.0–10.5)
nRBC: 0.3 % — ABNORMAL HIGH (ref 0.0–0.2)

## 2021-04-24 LAB — BASIC METABOLIC PANEL
Anion gap: 15 (ref 5–15)
BUN: 58 mg/dL — ABNORMAL HIGH (ref 8–23)
CO2: 19 mmol/L — ABNORMAL LOW (ref 22–32)
Calcium: 7.9 mg/dL — ABNORMAL LOW (ref 8.9–10.3)
Chloride: 109 mmol/L (ref 98–111)
Creatinine, Ser: 6.48 mg/dL — ABNORMAL HIGH (ref 0.44–1.00)
GFR, Estimated: 7 mL/min — ABNORMAL LOW (ref >60)
Glucose, Bld: 96 mg/dL (ref 70–99)
Potassium: 2.9 mmol/L — ABNORMAL LOW (ref 3.5–5.1)
Sodium: 143 mmol/L (ref 135–145)

## 2021-04-24 LAB — MAGNESIUM: Magnesium: 1.2 mg/dL — ABNORMAL LOW (ref 1.7–2.4)

## 2021-04-24 LAB — HEPARIN LEVEL (UNFRACTIONATED): Heparin Unfractionated: 0.6 IU/mL (ref 0.30–0.70)

## 2021-04-24 LAB — PROTIME-INR
INR: 1.3 — ABNORMAL HIGH (ref 0.8–1.2)
Prothrombin Time: 15.8 seconds — ABNORMAL HIGH (ref 11.4–15.2)

## 2021-04-24 MED ORDER — WARFARIN - PHARMACIST DOSING INPATIENT: Freq: Every day | Status: DC

## 2021-04-24 MED ORDER — MAGNESIUM SULFATE 2 GM/50ML IV SOLN
2.0000 g | Freq: Once | INTRAVENOUS | Status: AC
  Administered 2021-04-24: 2 g via INTRAVENOUS
  Filled 2021-04-24: qty 50

## 2021-04-24 MED ORDER — POTASSIUM CHLORIDE CRYS ER 20 MEQ PO TBCR
40.0000 meq | EXTENDED_RELEASE_TABLET | Freq: Two times a day (BID) | ORAL | Status: AC
  Administered 2021-04-24 (×2): 40 meq via ORAL
  Filled 2021-04-24 (×2): qty 2

## 2021-04-24 MED ORDER — WARFARIN SODIUM 5 MG PO TABS
10.0000 mg | ORAL_TABLET | Freq: Once | ORAL | Status: AC
  Administered 2021-04-24: 10 mg via ORAL
  Filled 2021-04-24: qty 2

## 2021-04-24 NOTE — Progress Notes (Signed)
[facility] Progress Note    Assessment/ Plan:    ARF - due to obstructive uropathy with bilateral hydronephrosis and hydroureter.  CT scan with enlarged uterus and retroperitoneal lymphadenopathy.  Foley catheter placed with minimal UOP.  Here for bilateral percutaneous nephrostomy tube placement by IR.  No indication for dialysis at this time.  Will follow renal function after obstruction is relieved and hopefully will get full recovery, although it sounds like this has been going on for some time. She states that she has had issues with urination for a mth but had outpatient labs 2 weeks ago but did not know results. - If early then renal function should quickly improve after relief of obstruction; b/l PCN 10Fr on 1/7 by VIR.  - stop bicarb - Very good output after PCN's placed  - no need for dialysis at this time - on NS as well- having post-obstructive diuresis, watch Na- may need to switch to 1/2 NS - K and Mg repletion   -Monitor Daily I/Os, Daily weight  -Maintain MAP>65 for optimal renal perfusion.  -Avoid nephrotoxic medications including NSAIDs - Dose meds for GFR <2ml/min for now  Obstructive uropathy -  bilateral PCN by IR on 1/7 Coagulopathy - had to be treated with  vitamin K . Uterine mass with vaginal bleeding - concerning for malignancy given retroperitoneal lymphadenopathy. 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  Anemia - normocytic and likely due to chronic vaginal bleeding for past 2 months.  Transfuse prn.  Atrial fibrillation - rate controlled.  Anticoagulation on hold as above.  Anion gap metabolic acidosis - due to ARF, resolving    Subjective:   Mild abd discomfort but denies nausea, fever, chills, dyspnea. Has a good appetite.   Objective:   BP 134/70 (BP Location: Left Arm)    Pulse 91    Temp 98.2 F (36.8 C) (Oral)    Resp 17    Wt (!) 136.5 kg    SpO2 100%     BMI 55.05 kg/m   Intake/Output Summary (Last 24 hours) at 04/24/2021 1417 Last data filed at 04/24/2021 1200 Gross per 24 hour  Intake 953.44 ml  Output 2400 ml  Net -1446.56 ml   Weight change:   Physical Exam: General appearance: alert, cooperative, no distress, and morbidly obese Head: NCAT Resp: CTA b/l Cardio: RRR GU: bilateral PCNs with pink-tinged urine Extremities: edema chronic lower extremity edema (R>L)  Imaging: No results found.  Labs: BMET Recent Labs  Lab 04/19/21 0359 04/20/21 0037 04/21/21 0534 04/21/21 2319 04/22/21 0414 04/23/21 0213 04/24/21 0549  NA 137 136 131* 141 141 144 143  K 3.7 4.3 4.0 3.9 3.7 3.2* 2.9*  CL 107 106 105 109 108 107 109  CO2 13* 15* 11* 16* 15* 21* 19*  GLUCOSE 93 99 94 107* 97 90 96  BUN 106* 106* 113* 108* 104* 83* 58*  CREATININE 14.74* 15.28* 16.58* 14.54* 14.29* 10.29* 6.48*  CALCIUM 8.0* 8.3* 8.1* 7.8* 8.4* 8.5* 7.9*   CBC Recent Labs  Lab 04/18/21 1718 04/19/21 0359 04/21/21 0534 04/22/21 0414 04/23/21 0213 04/24/21 0549  WBC 11.4*   < > 11.2* 11.5* 11.2* 13.1*  NEUTROABS 8.5*  --   --   --   --   --   HGB 8.5*   < > 7.4* 7.7* 8.2* 7.9*  HCT 27.0*   < > 23.2* 23.8*  25.4* 25.1*  MCV 92.5   < > 91.7 90.2 91.0 93.7  PLT 383   < > 342 387 403* 399   < > = values in this interval not displayed.    Medications:     atenolol  50 mg Oral Daily   insulin aspart  0-15 Units Subcutaneous TID WC   insulin aspart  0-5 Units Subcutaneous QHS   mupirocin ointment  1 application Nasal BID   PHENobarbital  97.2 mg Oral BID   potassium chloride  40 mEq Oral BID      Madelon Lips MD 04/24/2021, 2:17 PM

## 2021-04-24 NOTE — Progress Notes (Signed)
[facility] for IV Heparin Indication:  Hx DVT/PE and malignancy  No Known Allergies  Patient Measurements: Weight: (!) 136.5 kg (301 lb) Ideal body weight 50 kg Heparin Dosing Weight: 84.7 kg  Vital Signs: Temp: 98.1 F (36.7 C) (01/10 0438) Temp Source: Oral (01/10 0438) BP: 123/73 (01/10 0438) Pulse Rate: 95 (01/10 0438)  Labs: Recent Labs    04/21/21 2319 04/22/21 0414 04/22/21 1449 04/23/21 0213 04/24/21 0549  HGB  --  7.7*  --  8.2*  --   HCT  --  23.8*  --  25.4*  --   PLT  --  387  --  403*  --   LABPROT  --  16.2*  --  16.1*  --   INR  --  1.3*  --  1.3*  --   HEPARINUNFRC >1.10* 0.42 0.45 0.51 0.60  CREATININE 14.54* 14.29*  --  10.29*  --      Estimated Creatinine Clearance: 7.6 mL/min (A) (by C-G formula based on SCr of 10.29 mg/dL (H)).   Assessment: 61 years of age female on warfarin prior to admission who was admitted with INR of 8.5. Patient now s/p several doses of Vitamin K 2mg  po and INR is down to 1.3. MD consulted pharmacy to start IV heparin due to high clot risk with history of DVT/PE and malignancy. She is s/p bilateral nephrostomy tubes on 1/7.  Heparin level is therapeutic at 0.6 on 1600 units/hr. No bleeding noted. Hgb low stable, platelets are normal.   Goal of Therapy:  Heparin level 0.3-0.7 units/ml Monitor platelets by anticoagulation protocol: Yes   Plan:  Continue IV heparin drip at 1600 units/hr Daily heparin level and CBC Monitor for s/sx of bleeding  Thank you for involving pharmacy in this patient's care.  Renold Genta, PharmD, BCPS Clinical Pharmacist Clinical phone for 04/24/2021 until 3p is [phone] 04/24/2021 7:32 AM  **Pharmacist phone directory can be found on [email or web address] listed under [place]**

## 2021-04-24 NOTE — Progress Notes (Signed)
Mobility Specialist Progress Note:   04/24/21 1200  Mobility  Activity Ambulated in hall  Level of Assistance Standby assist, set-up cues, supervision of patient - no hands on  Assistive Device Front wheel walker  Distance Ambulated (ft) 220 ft  Mobility Sit up in bed/chair position for meals;Ambulated with assistance in hallway  Mobility Response Tolerated well  Mobility performed by Mobility specialist  Bed Position Chair  $Mobility charge 1 Mobility   Pt eager for mobility. Displayed SOB during ambulation, requiring x1 seated rest break. SpO2 stayed at 99% throughout session. Pt sitting up in chair with all needs met.   Nelta Numbers Mobility Specialist  Phone [phone]

## 2021-04-24 NOTE — Progress Notes (Signed)
Mobility Specialist Progress Note:   04/24/21 1430  Mobility  Activity Repositioned in chair  Level of Assistance Minimal assist, patient does 75% or more  Assistive Device None  Mobility Response Tolerated well  Mobility performed by Mobility specialist  $Mobility charge 1 Mobility   Pt requesting to be repositioned in bed. Left in bed with bed alarm on.  Nelta Numbers Mobility Specialist  Phone [phone]

## 2021-04-24 NOTE — Progress Notes (Signed)
PROGRESS NOTE  Debbie [name] ZSM:[id number] DOB: 17-Apr-1960 DOA: 04/18/2021 PCP: [facility] [place]  Brief History   Debbie [name] is a 61 y.o. female with medical history significant for seizure disorder, pulmonary embolism on warfarin, atrial fibrillation, hypertension, who was referred by the warfarin clinic to the emergency room for elevated INR.  The patient complained of a 55-month history of vaginal bleeding.  She was given megestrol prescription about 5 days prior to admission.  She also complained of generalized weakness, fatigue shortness of breath and 50 pound weight loss over a 80-month period. At [facility] the patient was found to have a creatinine of 14.34. Imaging revealed hydronephrosis and a distended bladder and uterus, retroperitoneal lymphadenopathy. There also was gallbladder sludge. Urology at [facility] performed cystoscopy with biopsies that discovered that the ureteral orifices could not be identified due to bladder mass. Lesions in the bladder were fulgrated. The patient was sent to [facility] for placement of bilateral percutaneous nephrostomy tubes which was done by IR on 04/21/21. She has continued to require IV fluids due to high urinary output due to post-obstructive diuresis.   Consultants  Interventional radiology Urology Nephrology  Procedures  Cystoscopy with biopsies and fulgration Bilateral percutaneous nephrostomy tube placement on 04/21/2021  Antibiotics   Anti-infectives (From admission, onward)    Start     Dose/Rate Route Frequency Ordered Stop   04/21/21 1006  sodium chloride 0.9 % with cefTRIAXone (ROCEPHIN) ADS Med       Note to Pharmacy: Fredric Dine: cabinet override      04/21/21 1006 04/21/21 2214   04/21/21 1000  cefTRIAXone (ROCEPHIN) 2 g in sodium chloride 0.9 % 100 mL IVPB  Status:  Discontinued        2 g 200 mL/hr over 30 Minutes Intravenous On call 04/20/21 1827 04/20/21 1828   04/21/21 1000  cefTRIAXone (ROCEPHIN) 2 g in sodium  chloride 0.9 % 100 mL IVPB        2 g 200 mL/hr over 30 Minutes Intravenous On call 04/20/21 1828 04/21/21 1228   04/21/21 0700  cefTRIAXone (ROCEPHIN) 2 g in sodium chloride 0.9 % 100 mL IVPB  Status:  Discontinued        2 g 200 mL/hr over 30 Minutes Intravenous On call 04/20/21 1656 04/20/21 1827   04/20/21 1400  cefTRIAXone (ROCEPHIN) 2 g in sodium chloride 0.9 % 100 mL IVPB  Status:  Discontinued        2 g 200 mL/hr over 30 Minutes Intravenous On call 04/20/21 0956 04/20/21 1656   04/19/21 1145  ceFAZolin (ANCEF) IVPB 3g/100 mL premix        3 g 200 mL/hr over 30 Minutes Intravenous  Once 04/19/21 1135 04/19/21 1250      Subjective  Patient denies any new complaints, resting comfortably in bed Objective   Vitals:  Vitals:   04/24/21 0804 04/24/21 1631  BP: 134/70 133/87  Pulse: 91 94  Resp: 17 17  Temp: 98.2 F (36.8 C) 98.4 F (36.9 C)  SpO2: 100% 100%    Exam:  General: NAD  Cardiovascular: S1, S2 present Respiratory: CTAB Abdomen: Soft, nontender, nondistended, bowel sounds present, noted bilateral nephrostomy tubes draining urine Musculoskeletal: 1+ bilateral pedal edema noted, worse on R (chronic) Skin: Normal Psychiatry: Normal mood  I have personally reviewed the following:    Scheduled Meds:  atenolol  50 mg Oral Daily   insulin aspart  0-15 Units Subcutaneous TID WC   insulin aspart  0-5 Units Subcutaneous QHS   mupirocin ointment  1 application Nasal BID   PHENobarbital  97.2 mg Oral BID   potassium chloride  40 mEq Oral BID   Continuous Infusions:  sodium chloride 75 mL/hr at 04/24/21 1302   heparin 1,600 Units/hr (04/23/21 2334)   magnesium sulfate bolus IVPB      Principal Problem:   AKI (acute kidney injury) ([place]) Active Problems:   Acute pulmonary embolism (HCC)   Acute deep vein thrombosis (DVT) of right lower extremity (HCC)   Unspecified atrial fibrillation (HCC)   HTN (hypertension)   Seizure disorder (HCC)   LOS: 6 days    A & P  AKI, obstructive uropathy, bilateral hydronephrosis Cr 16 on presentation, currently trending down Foley catheter is in place S/p bladder biopsy with fulguration IR consulted, s/p bilateral nephrostomy tubes placed on 1.7.2023 Nephrology consulted, noted post-obstructive diuresis Continue IV fluids and to monitor creatinine and electrolytes  Hypokalemia/hypomagnesemia Replace as needed   Coumadin coagulopathy INR was 8.5 on admission.  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 Follow-up with gynecologist for further management, has outpatient follow up on 04/30/21  Leukocytosis Currently afebrile No obvious signs of infection Monitor CBC  History of DVT/PE, paroxysmal atrial fibrillation On IV Heparin, plan to restart Coumadin   Hypertension Continue atenolol  Type II DM SSI, Accu-Cheks, hypoglycemic protocol  Seizure disorder Continue phenobarbital   DVT prophylaxis: Heparin gtt CODE STATUS: Full Code Family Communication: Discussed with daughter on the phone Disposition: Likely home   Alma Friendly, MD [facility] Direct contact: see [email or web address]  7PM-7AM contact night coverage as above

## 2021-04-24 NOTE — Progress Notes (Addendum)
[place] for IV Heparin/coumadin Indication:  Hx DVT/PE and malignancy  No Known Allergies  Patient Measurements: Weight: (!) 136.5 kg (301 lb) Ideal body weight 50 kg Heparin Dosing Weight: 84.7 kg  Vital Signs: Temp: 98.4 F (36.9 C) (01/10 1631) Temp Source: Oral (01/10 1631) BP: 133/87 (01/10 1631) Pulse Rate: 94 (01/10 1631)  Labs: Recent Labs    04/22/21 0414 04/22/21 1449 04/23/21 0213 04/24/21 0549 04/24/21 0816  HGB 7.7*  --  8.2* 7.9*  --   HCT 23.8*  --  25.4* 25.1*  --   PLT 387  --  403* 399  --   LABPROT 16.2*  --  16.1*  --  15.8*  INR 1.3*  --  1.3*  --  1.3*  HEPARINUNFRC 0.42 0.45 0.51 0.60  --   CREATININE 14.29*  --  10.29* 6.48*  --      Estimated Creatinine Clearance: 12 mL/min (A) (by C-G formula based on SCr of 6.48 mg/dL (H)).   Assessment: 61 years of age female on warfarin prior to admission who was admitted with INR of 8.5. Patient now s/p several doses of Vitamin K 2mg  po and INR is down to 1.3. MD consulted pharmacy to start IV heparin due to high clot risk with history of DVT/PE and malignancy. She is s/p bilateral nephrostomy tubes on 1/7.  Heparin level is therapeutic at 0.6 on 1600 units/hr. No bleeding noted. Hgb low stable, platelets are normal.   Addendum  Coumadin was ordered to be resumed tonight. She was previously on a pretty high dose before admission with an INR of 8. Subsequently, she has gotten several dose of vitamin k. She will likely be resistant to coumadin now.   PTA coumadin 10mg  qday except 15mg  MWF Goal of Therapy:  Heparin level 0.3-0.7 units/ml Monitor platelets by anticoagulation protocol: Yes   Plan:  Coumadin 10mg  PO x1 Continue IV heparin drip at 1600 units/hr Daily heparin level and CBC, INR Monitor for s/sx of bleeding  Onnie Boer, PharmD, BCIDP, AAHIVP, CPP Infectious Disease Pharmacist 04/24/2021 5:33 PM

## 2021-04-25 DIAGNOSIS — N179 Acute kidney failure, unspecified: Secondary | ICD-10-CM | POA: Diagnosis not present

## 2021-04-25 LAB — BASIC METABOLIC PANEL
Anion gap: 13 (ref 5–15)
BUN: 43 mg/dL — ABNORMAL HIGH (ref 8–23)
CO2: 20 mmol/L — ABNORMAL LOW (ref 22–32)
Calcium: 8.2 mg/dL — ABNORMAL LOW (ref 8.9–10.3)
Chloride: 111 mmol/L (ref 98–111)
Creatinine, Ser: 4.88 mg/dL — ABNORMAL HIGH (ref 0.44–1.00)
GFR, Estimated: 10 mL/min — ABNORMAL LOW (ref >60)
Glucose, Bld: 99 mg/dL (ref 70–99)
Potassium: 3.3 mmol/L — ABNORMAL LOW (ref 3.5–5.1)
Sodium: 144 mmol/L (ref 135–145)

## 2021-04-25 LAB — CBC
HCT: 25.5 % — ABNORMAL LOW (ref 36.0–46.0)
Hemoglobin: 7.8 g/dL — ABNORMAL LOW (ref 12.0–15.0)
MCH: 29.2 pg (ref 26.0–34.0)
MCHC: 30.6 g/dL (ref 30.0–36.0)
MCV: 95.5 fL (ref 80.0–100.0)
Platelets: 463 10*3/uL — ABNORMAL HIGH (ref 150–400)
RBC: 2.67 MIL/uL — ABNORMAL LOW (ref 3.87–5.11)
RDW: 14.7 % (ref 11.5–15.5)
WBC: 12.4 10*3/uL — ABNORMAL HIGH (ref 4.0–10.5)
nRBC: 0.6 % — ABNORMAL HIGH (ref 0.0–0.2)

## 2021-04-25 LAB — IRON AND TIBC
Iron: 55 ug/dL (ref 28–170)
Saturation Ratios: 21 % (ref 10.4–31.8)
TIBC: 266 ug/dL (ref 250–450)
UIBC: 211 ug/dL

## 2021-04-25 LAB — RETICULOCYTES
Immature Retic Fract: 40.7 % — ABNORMAL HIGH (ref 2.3–15.9)
RBC.: 2.7 MIL/uL — ABNORMAL LOW (ref 3.87–5.11)
Retic Count, Absolute: 120.1 10*3/uL (ref 19.0–186.0)
Retic Ct Pct: 4.5 % — ABNORMAL HIGH (ref 0.4–3.1)

## 2021-04-25 LAB — HEPARIN LEVEL (UNFRACTIONATED): Heparin Unfractionated: 0.43 IU/mL (ref 0.30–0.70)

## 2021-04-25 LAB — PROTIME-INR
INR: 1.3 — ABNORMAL HIGH (ref 0.8–1.2)
Prothrombin Time: 16.6 seconds — ABNORMAL HIGH (ref 11.4–15.2)

## 2021-04-25 LAB — GLUCOSE, CAPILLARY
Glucose-Capillary: 114 mg/dL — ABNORMAL HIGH (ref 70–99)
Glucose-Capillary: 99 mg/dL (ref 70–99)
Glucose-Capillary: 121 mg/dL — ABNORMAL HIGH (ref 70–99)
Glucose-Capillary: 130 mg/dL — ABNORMAL HIGH (ref 70–99)

## 2021-04-25 LAB — VITAMIN B12: Vitamin B-12: 328 pg/mL (ref 180–914)

## 2021-04-25 LAB — MAGNESIUM: Magnesium: 1.8 mg/dL (ref 1.7–2.4)

## 2021-04-25 LAB — FERRITIN: Ferritin: 186 ng/mL (ref 11–307)

## 2021-04-25 LAB — FOLATE: Folate: 3.7 ng/mL — ABNORMAL LOW (ref >5.9)

## 2021-04-25 MED ORDER — WARFARIN SODIUM 5 MG PO TABS
10.0000 mg | ORAL_TABLET | Freq: Once | ORAL | Status: AC
  Administered 2021-04-25: 10 mg via ORAL
  Filled 2021-04-25: qty 2

## 2021-04-25 MED ORDER — POTASSIUM CHLORIDE CRYS ER 20 MEQ PO TBCR
40.0000 meq | EXTENDED_RELEASE_TABLET | Freq: Once | ORAL | Status: AC
  Administered 2021-04-25: 40 meq via ORAL
  Filled 2021-04-25: qty 2

## 2021-04-25 MED ORDER — SODIUM CHLORIDE 0.9 % IV SOLN: INTRAVENOUS | Status: AC

## 2021-04-25 NOTE — Progress Notes (Signed)
[facility] Progress Note    Assessment/ Plan:    ARF - due to obstructive uropathy with bilateral hydronephrosis and hydroureter.  CT scan with enlarged uterus and retroperitoneal lymphadenopathy.  Foley catheter placed with minimal UOP.  Here for bilateral percutaneous nephrostomy tube placement by IR.  No indication for dialysis at this time.  Will follow renal function after obstruction is relieved and hopefully will get full recovery, although it sounds like this has been going on for some time. She states that she has had issues with urination for a mth but had outpatient labs 2 weeks ago but did not know results. - If early then renal function should quickly improve after relief of obstruction; b/l PCN 10Fr on 1/7 by VIR.  - stop bicarb - Very good output after PCN's placed  - no need for dialysis at this time - on NS as well- having post-obstructive diuresis, watch Na- may need to switch to 1/2 NS - K and Mg repletion  - nothing else to add at this time- will sign off.  Call with questions.  -Monitor Daily I/Os, Daily weight  -Maintain MAP>65 for optimal renal perfusion.  -Avoid nephrotoxic medications including NSAIDs - Dose meds for GFR <61ml/min for now  Obstructive uropathy -  bilateral PCN by IR on 1/7 Coagulopathy - had to be treated with  vitamin K . Uterine mass with vaginal bleeding - concerning for malignancy given retroperitoneal lymphadenopathy. 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  Anemia - normocytic and likely due to chronic vaginal bleeding for past 2 months.  Transfuse prn.  Atrial fibrillation - rate controlled.  Anticoagulation on hold as above.  Anion gap metabolic acidosis - due to ARF, resolving    Subjective:    Doing well.  Cr continues to trend down, good appetite.    Objective:   BP (!) 131/49 (BP Location: Left Wrist)    Pulse 95    Temp 98.3 F (36.8  C) (Oral)    Resp 18    Wt (!) 136.5 kg    SpO2 99%    BMI 55.05 kg/m   Intake/Output Summary (Last 24 hours) at 04/25/2021 1407 Last data filed at 04/25/2021 1016 Gross per 24 hour  Intake 3733.4 ml  Output 3475 ml  Net 258.4 ml   Weight change:   Physical Exam: General appearance: alert, cooperative, no distress, and morbidly obese Head: NCAT Resp: CTA b/l Cardio: RRR GU: bilateral PCNs with pink-tinged urine Extremities: edema chronic lower extremity edema (R>L)  Imaging: No results found.  Labs: BMET Recent Labs  Lab 04/20/21 0037 04/21/21 0534 04/21/21 2319 04/22/21 0414 04/23/21 0213 04/24/21 0549 04/25/21 0039  NA 136 131* 141 141 144 143 144  K 4.3 4.0 3.9 3.7 3.2* 2.9* 3.3*  CL 106 105 109 108 107 109 111  CO2 15* 11* 16* 15* 21* 19* 20*  GLUCOSE 99 94 107* 97 90 96 99  BUN 106* 113* 108* 104* 83* 58* 43*  CREATININE 15.28* 16.58* 14.54* 14.29* 10.29* 6.48* 4.88*  CALCIUM 8.3* 8.1* 7.8* 8.4* 8.5* 7.9* 8.2*   CBC Recent Labs  Lab 04/18/21 1718 04/19/21 0359 04/22/21 0414 04/23/21 0213 04/24/21 0549 04/25/21 0039  WBC 11.4*   < > 11.5* 11.2* 13.1* 12.4*  NEUTROABS 8.5*  --   --   --   --   --   HGB 8.5*   < >  7.7* 8.2* 7.9* 7.8*  HCT 27.0*   < > 23.8* 25.4* 25.1* 25.5*  MCV 92.5   < > 90.2 91.0 93.7 95.5  PLT 383   < > 387 403* 399 463*   < > = values in this interval not displayed.    Medications:     atenolol  50 mg Oral Daily   insulin aspart  0-15 Units Subcutaneous TID WC   insulin aspart  0-5 Units Subcutaneous QHS   PHENobarbital  97.2 mg Oral BID   potassium chloride  40 mEq Oral Once   warfarin  10 mg Oral ONCE-1600   Warfarin - Pharmacist Dosing Inpatient   Does not apply q1600      Madelon Lips MD 04/25/2021, 2:07 PM

## 2021-04-25 NOTE — Progress Notes (Signed)
[place] for IV Heparin/coumadin Indication:  Hx DVT/PE and malignancy  No Known Allergies  Patient Measurements: Weight: (!) 136.5 kg (301 lb) Ideal body weight 50 kg Heparin Dosing Weight: 84.7 kg  Vital Signs: Temp: 98.3 F (36.8 C) (01/11 0742) Temp Source: Oral (01/11 0742) BP: 131/49 (01/11 0742) Pulse Rate: 95 (01/11 0742)  Labs: Recent Labs    04/23/21 0213 04/24/21 0549 04/24/21 0816 04/25/21 0039  HGB 8.2* 7.9*  --  7.8*  HCT 25.4* 25.1*  --  25.5*  PLT 403* 399  --  463*  LABPROT 16.1*  --  15.8* 16.6*  INR 1.3*  --  1.3* 1.3*  HEPARINUNFRC 0.51 0.60  --  0.43  CREATININE 10.29* 6.48*  --  4.88*     Estimated Creatinine Clearance: 16 mL/min (A) (by C-G formula based on SCr of 4.88 mg/dL (H)).   Assessment: 61 years of age female on warfarin prior to admission who was admitted with INR of 8.5. Patient now s/p several doses of Vitamin K 2mg  po and INR down to 1.3. MD consulted pharmacy to start IV heparin due to high clot risk with history of DVT/PE and malignancy. Warfarin resumed 1/10. She is s/p bilateral nephrostomy tubes on 1/7.  Heparin level is therapeutic at 0.43 on 1600 units/hr. INR stable at 1.3. CBC stable. No bleed issues reported.  PTA coumadin 10mg  qday except 15mg  MWF  Goal of Therapy:  Heparin level 0.3-0.7 units/ml Monitor platelets by anticoagulation protocol: Yes   Plan:  Repeat warfarin 10mg  PO x 1 today Continue IV heparin drip at 1600 units/hr while subtherapeutic Daily heparin level and CBC, INR Monitor for s/sx of bleeding   Arturo Morton, PharmD, BCPS Please check AMION for all [place] contact numbers Clinical Pharmacist 04/25/2021 12:57 PM

## 2021-04-25 NOTE — Progress Notes (Addendum)
PROGRESS NOTE  Debbie [name] QQI:[id number] DOB: January 22, 1961 DOA: 04/18/2021 PCP: [facility] [place]  Brief History   Debbie [name] is a 61 y.o. female with medical history significant for seizure disorder, pulmonary embolism on warfarin, atrial fibrillation, hypertension, was sent to ED by the warfarin clinic to the emergency room for elevated INR.   -Pt reported 29-month history of vaginal bleeding.  She was given megestrol prescription about 5 days prior to admission.  She also complained of generalized weakness, fatigue shortness of breath and 50 pound weight loss over a 45-month period. At [facility] the patient was found to have a creatinine of 14.34. Imaging revealed hydronephrosis and a distended bladder and uterus, retroperitoneal lymphadenopathy.  - Urology at [facility] performed cystoscopy, noted to have an infiltrative bladder mass involving the trigone, ureteral orifice could not be identified, biopsies with fulguration performed  -The patient was sent to [facility] for placement of bilateral percutaneous nephrostomy tubes which was done by IR on 04/21/21.  -Nephrology is following, remains on IV fluids   A & P   AKI, obstructive uropathy, bilateral hydronephrosis infiltrative bladder mass Cr 16 on presentation, currently trending down Foley catheter is in place -Seen by urology at [facility], cystoscopy noted infiltrative bladder mass, s/p bladder biopsy with fulguration, biopsy was inconclusive-Path noted papillary fragments of urothelial mucosa with focal inflammation and mild atypia -Interventional radiology consulted, s/p bilateral nephrostomy tubes placement on 1/7 -Nephrology following as well -Creatinine peaked at 16, improving, down to 4.8 today -Continues to have brisk urinary output, continue normal saline at 75 mL/h -Check BMP in a.m. -PT OT eval -d/w Dr.Mckenzie today, he recommended GYN FU and DC with PCNs and FU with him in 2-3weeks, may need repeat Cysto-biopsy if  GYn workup is unremarkable  History of vaginal bleeding -CT noted to have distended uterus -Patient is a follow-up with GYN in [place] on 1/16  Acute on chronic anemia -Secondary to above issues, hemoglobin trending down from 8.5, down to 7.8 today -Check anemia panel  Leukocytosis -Mild, likely reactive secondary to above issues, afebrile and nontoxic, monitor  History of DVT/PE, paroxysmal atrial fibrillation -Remains on IV heparin, Coumadin on hold  Hypokalemia/hypomagnesemia Replace as needed   Coumadin coagulopathy INR was 8.5 on admission.  S/p IV vitamin K Heparin drip resumed following procedure -Can restart Coumadin when hemoglobin stabilizes   Hypertension Continue atenolol  Type II DM SSI, Accu-Cheks, hypoglycemic protocol -CBGs are stable  Seizure disorder Continue phenobarbital   DVT prophylaxis: Heparin gtt CODE STATUS: Full Code Family Communication: No family at bedside, Dr. Horris Latino discussed with daughter yesterday Disposition: Home in 2 to 3 days   Domenic Polite, MD [facility]  Consultants  Interventional radiology Urology Nephrology  Procedures  Cystoscopy with biopsies and fulgration Dr.McKenzie at [facility] Bilateral percutaneous nephrostomy tube placement on 04/21/2021 Dr.Shick  Antibiotics   Anti-infectives (From admission, onward)    Start     Dose/Rate Route Frequency Ordered Stop   04/21/21 1006  sodium chloride 0.9 % with cefTRIAXone (ROCEPHIN) ADS Med       Note to Pharmacy: Fredric Dine: cabinet override      04/21/21 1006 04/21/21 2214   04/21/21 1000  cefTRIAXone (ROCEPHIN) 2 g in sodium chloride 0.9 % 100 mL IVPB  Status:  Discontinued        2 g 200 mL/hr over 30 Minutes Intravenous On call 04/20/21 1827 04/20/21 1828   04/21/21 1000  cefTRIAXone (ROCEPHIN) 2 g in sodium chloride 0.9 %  100 mL IVPB        2 g 200 mL/hr over 30 Minutes Intravenous On call 04/20/21 1828 04/21/21 1228   04/21/21 0700  cefTRIAXone  (ROCEPHIN) 2 g in sodium chloride 0.9 % 100 mL IVPB  Status:  Discontinued        2 g 200 mL/hr over 30 Minutes Intravenous On call 04/20/21 1656 04/20/21 1827   04/20/21 1400  cefTRIAXone (ROCEPHIN) 2 g in sodium chloride 0.9 % 100 mL IVPB  Status:  Discontinued        2 g 200 mL/hr over 30 Minutes Intravenous On call 04/20/21 0956 04/20/21 1656   04/19/21 1145  ceFAZolin (ANCEF) IVPB 3g/100 mL premix        3 g 200 mL/hr over 30 Minutes Intravenous  Once 04/19/21 1135 04/19/21 1250      Subjective  Patient denies any new complaints, resting comfortably in bed Objective   Vitals:  Vitals:   04/25/21 0405 04/25/21 0742  BP: 126/82 (!) 131/49  Pulse: 88 95  Resp: 18 18  Temp: 98.2 F (36.8 C) 98.3 F (36.8 C)  SpO2: 100% 99%    Exam:  General: NAD  Cardiovascular: S1, S2 present Respiratory: CTAB Abdomen: Soft, nontender, nondistended, bowel sounds present, noted bilateral nephrostomy tubes draining urine Musculoskeletal: 1+ bilateral pedal edema noted, worse on R (chronic) Skin: Normal Psychiatry: Normal mood  I have personally reviewed the following:    Scheduled Meds:  atenolol  50 mg Oral Daily   insulin aspart  0-15 Units Subcutaneous TID WC   insulin aspart  0-5 Units Subcutaneous QHS   PHENobarbital  97.2 mg Oral BID   Warfarin - Pharmacist Dosing Inpatient   Does not apply q1600   Continuous Infusions:  sodium chloride 75 mL/hr at 04/25/21 0246   heparin 1,600 Units/hr (04/25/21 0717)    Principal Problem:   AKI (acute kidney injury) ([place]) Active Problems:   Acute pulmonary embolism (HCC)   Acute deep vein thrombosis (DVT) of right lower extremity (HCC)   Unspecified atrial fibrillation (HCC)   HTN (hypertension)   Seizure disorder ([place])   LOS: 7 days

## 2021-04-25 NOTE — Progress Notes (Signed)
Mobility Specialist Progress Note:   04/25/21 1500  Mobility  Activity Ambulated in hall  Level of Assistance Standby assist, set-up cues, supervision of patient - no hands on  Assistive Device Front wheel walker  Distance Ambulated (ft) 220 ft  Mobility Ambulated with assistance in hallway  Mobility Response Tolerated well  Mobility performed by Mobility specialist  $Mobility charge 1 Mobility   Pt with SOB during ambulation, SpO2 99% throughout. Required no physical assist throughout. Pt left in bed with bed alarm on.   Nelta Numbers Mobility Specialist  Phone [phone]

## 2021-04-25 NOTE — Progress Notes (Signed)
Mobility Specialist Progress Note:   04/25/21 1220  Mobility  Activity Transferred to/from [facility]  Level of Assistance Standby assist, set-up cues, supervision of patient - no hands on  Assistive Device None  Distance Ambulated (ft) 2 ft  Mobility Out of bed for toileting  Mobility Response Tolerated well  Mobility performed by Mobility specialist  $Mobility charge 1 Mobility   Pt received requesting to get back in bed from [facility]. Transferred performed on supervision level. Pt left in bed with all needs met.   Nelta Numbers Mobility Specialist  Phone [phone]

## 2021-04-26 LAB — BASIC METABOLIC PANEL
Anion gap: 13 (ref 5–15)
Anion gap: 11 (ref 5–15)
BUN: 27 mg/dL — ABNORMAL HIGH (ref 8–23)
BUN: 20 mg/dL (ref 8–23)
CO2: 20 mmol/L — ABNORMAL LOW (ref 22–32)
CO2: 19 mmol/L — ABNORMAL LOW (ref 22–32)
Calcium: 8.3 mg/dL — ABNORMAL LOW (ref 8.9–10.3)
Calcium: 8.3 mg/dL — ABNORMAL LOW (ref 8.9–10.3)
Chloride: 109 mmol/L (ref 98–111)
Chloride: 111 mmol/L (ref 98–111)
Creatinine, Ser: 3.45 mg/dL — ABNORMAL HIGH (ref 0.44–1.00)
Creatinine, Ser: 3.02 mg/dL — ABNORMAL HIGH (ref 0.44–1.00)
GFR, Estimated: 14 mL/min — ABNORMAL LOW (ref >60)
GFR, Estimated: 17 mL/min — ABNORMAL LOW (ref >60)
Glucose, Bld: 93 mg/dL (ref 70–99)
Glucose, Bld: 124 mg/dL — ABNORMAL HIGH (ref 70–99)
Potassium: 2.9 mmol/L — ABNORMAL LOW (ref 3.5–5.1)
Potassium: 3.7 mmol/L (ref 3.5–5.1)
Sodium: 142 mmol/L (ref 135–145)
Sodium: 141 mmol/L (ref 135–145)

## 2021-04-26 LAB — MAGNESIUM
Magnesium: 1.2 mg/dL — ABNORMAL LOW (ref 1.7–2.4)
Magnesium: 2.1 mg/dL (ref 1.7–2.4)

## 2021-04-26 LAB — CBC
HCT: 25.1 % — ABNORMAL LOW (ref 36.0–46.0)
Hemoglobin: 7.8 g/dL — ABNORMAL LOW (ref 12.0–15.0)
MCH: 29.9 pg (ref 26.0–34.0)
MCHC: 31.1 g/dL (ref 30.0–36.0)
MCV: 96.2 fL (ref 80.0–100.0)
Platelets: 384 10*3/uL (ref 150–400)
RBC: 2.61 MIL/uL — ABNORMAL LOW (ref 3.87–5.11)
RDW: 15.3 % (ref 11.5–15.5)
WBC: 11.4 10*3/uL — ABNORMAL HIGH (ref 4.0–10.5)
nRBC: 0.5 % — ABNORMAL HIGH (ref 0.0–0.2)

## 2021-04-26 LAB — GLUCOSE, CAPILLARY
Glucose-Capillary: 118 mg/dL — ABNORMAL HIGH (ref 70–99)
Glucose-Capillary: 95 mg/dL (ref 70–99)
Glucose-Capillary: 98 mg/dL (ref 70–99)
Glucose-Capillary: 133 mg/dL — ABNORMAL HIGH (ref 70–99)
Glucose-Capillary: 128 mg/dL — ABNORMAL HIGH (ref 70–99)

## 2021-04-26 LAB — PROTIME-INR
INR: 1.3 — ABNORMAL HIGH (ref 0.8–1.2)
Prothrombin Time: 16.4 seconds — ABNORMAL HIGH (ref 11.4–15.2)

## 2021-04-26 LAB — HEPARIN LEVEL (UNFRACTIONATED): Heparin Unfractionated: 0.52 IU/mL (ref 0.30–0.70)

## 2021-04-26 MED ORDER — POTASSIUM CHLORIDE CRYS ER 10 MEQ PO TBCR: 30.0000 meq | EXTENDED_RELEASE_TABLET | ORAL | Status: DC

## 2021-04-26 MED ORDER — MAGNESIUM SULFATE 4 GM/100ML IV SOLN
4.0000 g | Freq: Once | INTRAVENOUS | Status: AC
  Administered 2021-04-26: 4 g via INTRAVENOUS
  Filled 2021-04-26: qty 100

## 2021-04-26 MED ORDER — BISMUTH SUBSALICYLATE 262 MG/15ML PO SUSP
30.0000 mL | Freq: Three times a day (TID) | ORAL | Status: AC | PRN
  Administered 2021-04-26 – 2021-04-27 (×2): 30 mL via ORAL
  Filled 2021-04-26 (×2): qty 236

## 2021-04-26 MED ORDER — WARFARIN SODIUM 5 MG PO TABS
10.0000 mg | ORAL_TABLET | Freq: Once | ORAL | Status: AC
  Administered 2021-04-26: 10 mg via ORAL
  Filled 2021-04-26: qty 2

## 2021-04-26 MED ORDER — PANTOPRAZOLE SODIUM 40 MG PO TBEC
40.0000 mg | DELAYED_RELEASE_TABLET | Freq: Every day | ORAL | Status: DC
  Administered 2021-04-26 – 2021-04-29 (×4): 40 mg via ORAL
  Filled 2021-04-26 (×4): qty 1

## 2021-04-26 MED ORDER — POTASSIUM CHLORIDE CRYS ER 20 MEQ PO TBCR
40.0000 meq | EXTENDED_RELEASE_TABLET | Freq: Once | ORAL | Status: AC
Start: 2021-04-26 — End: 2021-04-26
  Administered 2021-04-26: 40 meq via ORAL
  Filled 2021-04-26: qty 2

## 2021-04-26 MED ORDER — FOLIC ACID 1 MG PO TABS
1.0000 mg | ORAL_TABLET | Freq: Every day | ORAL | Status: DC
  Administered 2021-04-26 – 2021-04-29 (×4): 1 mg via ORAL
  Filled 2021-04-26 (×4): qty 1

## 2021-04-26 MED ORDER — CHLORHEXIDINE GLUCONATE CLOTH 2 % EX PADS
6.0000 | MEDICATED_PAD | Freq: Every day | CUTANEOUS | Status: DC
  Administered 2021-04-26 (×2): 6 via TOPICAL

## 2021-04-26 MED ORDER — CHLORHEXIDINE GLUCONATE CLOTH 2 % EX PADS: 6.0000 | MEDICATED_PAD | Freq: Every day | CUTANEOUS | Status: DC

## 2021-04-26 MED ORDER — POTASSIUM CHLORIDE CRYS ER 20 MEQ PO TBCR
40.0000 meq | EXTENDED_RELEASE_TABLET | ORAL | Status: AC
  Administered 2021-04-26 (×2): 40 meq via ORAL
  Filled 2021-04-26 (×2): qty 2

## 2021-04-26 NOTE — Progress Notes (Addendum)
PROGRESS NOTE  Gaby Harney Hubbard QIO:[id number] DOB: 05/09/1960 DOA: 04/18/2021 PCP: [facility] [place]  Brief History   HETHER ANSELMO is a 61 y.o. female with medical history significant for seizure disorder, pulmonary embolism on warfarin, atrial fibrillation, hypertension, who was sent to ED by the warfarin clinic for elevated INR.   -Pt reported 13-month history of vaginal bleeding.  She was given megestrol prescription about 5 days prior to admission.  She also complained of generalized weakness, fatigue shortness of breath and 50 pound weight loss over a 43-month period. At [facility] the patient was found to have a creatinine of 14.34. Imaging revealed hydronephrosis and a distended bladder and uterus, retroperitoneal lymphadenopathy.  - Urology at [facility] performed cystoscopy, noted to have an infiltrative bladder mass involving the trigone, ureteral orifice could not be identified, biopsies with fulguration performed  -The patient was sent to [facility] for placement of bilateral percutaneous nephrostomy tubes which was done by IR on 04/21/21.  -Nephrology was consulted.  AKI improved with bilateral percutaneous nephrostomies and IV fluid hydration.  Nephrology signed off 1/11.   A & P   AKI, obstructive uropathy, bilateral hydronephrosis infiltrative bladder mass Cr 16 on presentation, continues to steadily improve.  Creatinine down to 3.45. Foley catheter is in place but per RN, without drainage-May be related to bilateral ureteral obstruction -Seen by urology at [facility], cystoscopy noted infiltrative bladder mass, s/p bladder biopsy with fulguration, biopsy was inconclusive-Path noted papillary fragments of urothelial mucosa with focal inflammation and mild atypia -Interventional radiology consulted, s/p bilateral nephrostomy tubes placement on 1/7.  As per RN, left PCN draining quite well, straw-colored urine but right PCN with less drainage and pink-colored urine.  Right PCN:  2290 mL and left PCN 580 mL over last 24 hours. -Nephrology consulted.  AKI secondary to obstructive uropathy with bilateral hydronephrosis and hydroureter.  Nephrology signed off 1/11. -Continues to have brisk urinary output, continue normal saline at 75 mL/h. -Appears to be in post obstructive diuresis phase with associated severe hypokalemia.  Monitor BMP closely including this evening and daily. -Mobilize and follow. -Dr. Broadus John d/w [place] 1/11, he recommended GYN FU and DC with PCNs and FU with him in 2-3weeks, may need repeat Cysto-biopsy if GYn workup is unremarkable  Discussed with Dr. Alyson Ingles, 1/11 and since Foley catheter has no urine output, okay to DC Foley.  History of vaginal bleeding -CT noted to have distended uterus -Patient is a follow-up with GYN in [place] on 1/16 -Still having ongoing vaginal bleeding, however hemoglobin appears to be stable in the 7 g range for the last several days.  Acute on chronic anemia/folate deficiency. -Secondary to above issues -Hemoglobin has been stable in the 7 g range for the last 3 days.  Continue to monitor closely and transfuse if hemoglobin 7 g or less. -Anemia panel significant for iron 55, TIBC 266, saturation ratio 21, ferritin 186, folate 3.7 and B12: 328. -Started folate 1 Mg daily.  Leukocytosis -Mild, likely reactive secondary to above issues, afebrile and nontoxic, monitor  History of DVT/PE, paroxysmal atrial fibrillation -Remains on IV heparin, Coumadin was on hold but was restarted 1/11.  INR 1.3.  Hypokalemia/hypomagnesemia Potassium 2.9, replace aggressively and follow.  Including BMP this afternoon.  Magnesium 1.8.   Coumadin coagulopathy INR was 8.5 on admission.  S/p IV vitamin K Heparin drip resumed following procedure -Coumadin restarted 1/11.  INR 1.3.   Hypertension Continue atenolol.  Controlled.  Type II DM SSI, Accu-Cheks,  hypoglycemic protocol -CBGs are stable  Seizure disorder Continue  phenobarbital   DVT prophylaxis: Heparin gtt bridging with Coumadin. CODE STATUS: Full Code Family Communication: None at bedside. Disposition: Home in 2-3 days and pending therapeutic INR.    Consultants  Interventional radiology Urology Nephrology  Procedures  Cystoscopy with biopsies and fulgration Dr.McKenzie at [facility] Bilateral percutaneous nephrostomy tube placement on 04/21/2021 Dr.Shick  Antibiotics   Anti-infectives (From admission, onward)    Start     Dose/Rate Route Frequency Ordered Stop   04/21/21 1006  sodium chloride 0.9 % with cefTRIAXone (ROCEPHIN) ADS Med       Note to Pharmacy: Fredric Dine: cabinet override      04/21/21 1006 04/21/21 2214   04/21/21 1000  cefTRIAXone (ROCEPHIN) 2 g in sodium chloride 0.9 % 100 mL IVPB  Status:  Discontinued        2 g 200 mL/hr over 30 Minutes Intravenous On call 04/20/21 1827 04/20/21 1828   04/21/21 1000  cefTRIAXone (ROCEPHIN) 2 g in sodium chloride 0.9 % 100 mL IVPB        2 g 200 mL/hr over 30 Minutes Intravenous On call 04/20/21 1828 04/21/21 1228   04/21/21 0700  cefTRIAXone (ROCEPHIN) 2 g in sodium chloride 0.9 % 100 mL IVPB  Status:  Discontinued        2 g 200 mL/hr over 30 Minutes Intravenous On call 04/20/21 1656 04/20/21 1827   04/20/21 1400  cefTRIAXone (ROCEPHIN) 2 g in sodium chloride 0.9 % 100 mL IVPB  Status:  Discontinued        2 g 200 mL/hr over 30 Minutes Intravenous On call 04/20/21 0956 04/20/21 1656   04/19/21 1145  ceFAZolin (ANCEF) IVPB 3g/100 mL premix        3 g 200 mL/hr over 30 Minutes Intravenous  Once 04/19/21 1135 04/19/21 1250      Subjective  Interviewed and examined along with her female RN in the room.  Per RN report, Foley catheter without drainage.  Left PCN with good urine output compared to the right PCN.  Patient having steady trickle of vaginal bleeding without clots, significantly improved compared to admission.   Objective   Vitals:  Vitals:   04/26/21 0545 04/26/21  0854  BP: (!) 143/67 (!) 111/56  Pulse: 90 90  Resp: 18 18  Temp: 98.4 F (36.9 C) 98.5 F (36.9 C)  SpO2: 100% 100%    Exam:  General exam: Middle-age female, moderately built and obese sitting up comfortably in reclining chair. Respiratory system: Clear to auscultation. Respiratory effort normal. Cardiovascular system: S1 & S2 heard, RRR. No JVD, murmurs, rubs, gallops or clicks.  Trace bilateral leg edema. Gastrointestinal system: Abdomen is nondistended/protuberant, soft and nontender. No organomegaly or masses felt. Normal bowel sounds heard. Genitourinary: Foley catheter without urine output.  Bilateral PCNs in place. Central nervous system: Alert and oriented. No focal neurological deficits. Extremities: Symmetric 5 x 5 power. Skin: No rashes, lesions or ulcers Psychiatry: Judgement and insight appear normal. Mood & affect appropriate.     Scheduled Meds:  atenolol  50 mg Oral Daily   Chlorhexidine Gluconate Cloth  6 each Topical Daily   folic acid  1 mg Oral Daily   insulin aspart  0-15 Units Subcutaneous TID WC   insulin aspart  0-5 Units Subcutaneous QHS   PHENobarbital  97.2 mg Oral BID   potassium chloride  40 mEq Oral Q4H   Warfarin - Pharmacist Dosing Inpatient   Does not  apply q1600   Continuous Infusions:  sodium chloride 75 mL/hr at 04/26/21 0659   heparin 1,600 Units/hr (04/25/21 2253)    Principal Problem:   AKI (acute kidney injury) ([place]) Active Problems:   Acute pulmonary embolism (HCC)   Acute deep vein thrombosis (DVT) of right lower extremity (HCC)   Unspecified atrial fibrillation (HCC)   HTN (hypertension)   Seizure disorder ([place])   LOS: 8 days    Vernell Leep, MD,  FACP, [facility], [facility], [facility] (Care Management Physician Certified) [place]  To contact the attending provider between 7A-7P or the covering provider during after hours 7P-7A, please log into the web site [email or web address] and access using  universal [place] password for that web site. If you do not have the password, please call the hospital operator.

## 2021-04-26 NOTE — Progress Notes (Signed)
Interventional Radiology Brief Note:  PA called to bedside by RN for patient with poor output from R PCN. Concern for R PCN functioning properly.   Per chart documentation, R PCN with 400-500 mL output per day, L PCN with >2000 mL output per day.    PA to bedside for assessment.  Insertion sites intact.  No evidence of retraction.  L more sore that R per patient report, but no exquisite or site tenderness.  Both with pale colored urine in collection bags.  Both drains flush easily without issue.   Review of imaging by Dr. Laurence Ferrari suggests R kidney is more atrophic and urine output differential likely reflective of function of each kidney.   Patient aware.  RN aware.   No further needs in IR at this time.   Brynda Greathouse, MS RD PA-C

## 2021-04-26 NOTE — Progress Notes (Signed)
[facility] for IV Heparin/Coumadin Indication:  Hx DVT/PE and malignancy  No Known Allergies  Patient Measurements: Weight: (!) 136.5 kg (301 lb) Ideal body weight 50 kg Heparin Dosing Weight: 84.7 kg  Vital Signs: Temp: 98.5 F (36.9 C) (01/12 0854) Temp Source: Oral (01/12 0854) BP: 111/56 (01/12 0854) Pulse Rate: 90 (01/12 0854)  Labs: Recent Labs    04/24/21 0549 04/24/21 0816 04/25/21 0039 04/26/21 0101  HGB 7.9*  --  7.8* 7.8*  HCT 25.1*  --  25.5* 25.1*  PLT 399  --  463* 384  LABPROT  --  15.8* 16.6* 16.4*  INR  --  1.3* 1.3* 1.3*  HEPARINUNFRC 0.60  --  0.43 0.52  CREATININE 6.48*  --  4.88* 3.45*     Estimated Creatinine Clearance: 22.6 mL/min (A) (by C-G formula based on SCr of 3.45 mg/dL (H)).   Assessment: 61 years of age female on warfarin prior to admission who was admitted with INR of 8.5. Patient now s/p several doses of Vitamin K 2mg  po and INR down to 1.3. MD consulted pharmacy to dose IV heparin due to high clot risk with history of Afib, DVT/PE and malignancy. Warfarin resumed 1/10. She is s/p bilateral nephrostomy tubes on 1/7.  Heparin level is therapeutic and stable on 1600 units/hr. INR remains low at 1.3 post reversal. CBC stable; no bleeding issues reported.  PTA Coumadin 10mg  qday except 15mg  MWF  Goal of Therapy:  Heparin level 0.3-0.7 units/ml Monitor platelets by anticoagulation protocol: Yes   Plan:  Continue IV heparin at 1600 units/hr Repeat Coumadin 10mg  PO today Daily heparin level, INR and CBC   Shiquita Collignon D. Mina Marble, PharmD, BCPS, [place] 04/26/2021, 11:10 AM

## 2021-04-26 NOTE — Progress Notes (Signed)
Mobility Specialist Progress Note:   04/26/21 1600  Mobility  Activity Transferred to/from [facility]  Level of Assistance Standby assist, set-up cues, supervision of patient - no hands on  Assistive Device Front wheel walker  Distance Ambulated (ft) 2 ft  Mobility Out of bed for toileting  Mobility Response Tolerated well  Mobility performed by Mobility specialist  $Mobility charge 1 Mobility   Pt received requesting to get back to bed from [facility]. No physical assistance required. Pt declined hallway ambulation d/t diarrhea this afternoon. Pt back in bed with all needs met.   Nelta Numbers Mobility Specialist  Phone [phone]

## 2021-04-26 NOTE — Progress Notes (Signed)
Patient continues to have vaginal bleeding. Current HGB 7.8, J. Olena Heckle, NP made aware.  Call bell within reach and will continue to close monitor.

## 2021-04-27 LAB — CBC
HCT: 24.9 % — ABNORMAL LOW (ref 36.0–46.0)
Hemoglobin: 7.5 g/dL — ABNORMAL LOW (ref 12.0–15.0)
MCH: 29.3 pg (ref 26.0–34.0)
MCHC: 30.1 g/dL (ref 30.0–36.0)
MCV: 97.3 fL (ref 80.0–100.0)
Platelets: 355 10*3/uL (ref 150–400)
RBC: 2.56 MIL/uL — ABNORMAL LOW (ref 3.87–5.11)
RDW: 15.7 % — ABNORMAL HIGH (ref 11.5–15.5)
WBC: 11.2 10*3/uL — ABNORMAL HIGH (ref 4.0–10.5)
nRBC: 0.4 % — ABNORMAL HIGH (ref 0.0–0.2)

## 2021-04-27 LAB — GLUCOSE, CAPILLARY
Glucose-Capillary: 128 mg/dL — ABNORMAL HIGH (ref 70–99)
Glucose-Capillary: 90 mg/dL (ref 70–99)
Glucose-Capillary: 97 mg/dL (ref 70–99)
Glucose-Capillary: 126 mg/dL — ABNORMAL HIGH (ref 70–99)

## 2021-04-27 LAB — BASIC METABOLIC PANEL
Anion gap: 7 (ref 5–15)
BUN: 18 mg/dL (ref 8–23)
CO2: 20 mmol/L — ABNORMAL LOW (ref 22–32)
Calcium: 8.3 mg/dL — ABNORMAL LOW (ref 8.9–10.3)
Chloride: 114 mmol/L — ABNORMAL HIGH (ref 98–111)
Creatinine, Ser: 2.78 mg/dL — ABNORMAL HIGH (ref 0.44–1.00)
GFR, Estimated: 19 mL/min — ABNORMAL LOW (ref >60)
Glucose, Bld: 106 mg/dL — ABNORMAL HIGH (ref 70–99)
Potassium: 3.8 mmol/L (ref 3.5–5.1)
Sodium: 141 mmol/L (ref 135–145)

## 2021-04-27 LAB — PROTIME-INR
INR: 1.5 — ABNORMAL HIGH (ref 0.8–1.2)
Prothrombin Time: 17.6 seconds — ABNORMAL HIGH (ref 11.4–15.2)

## 2021-04-27 LAB — MAGNESIUM: Magnesium: 1.8 mg/dL (ref 1.7–2.4)

## 2021-04-27 LAB — HEPARIN LEVEL (UNFRACTIONATED): Heparin Unfractionated: 0.46 IU/mL (ref 0.30–0.70)

## 2021-04-27 MED ORDER — WARFARIN SODIUM 5 MG PO TABS
15.0000 mg | ORAL_TABLET | Freq: Once | ORAL | Status: AC
  Administered 2021-04-27: 15 mg via ORAL
  Filled 2021-04-27: qty 3

## 2021-04-27 MED ORDER — SODIUM CHLORIDE 0.9 % IV SOLN: INTRAVENOUS | Status: AC

## 2021-04-27 MED ORDER — BISMUTH SUBSALICYLATE 262 MG/15ML PO SUSP
30.0000 mL | Freq: Three times a day (TID) | ORAL | Status: AC | PRN
  Administered 2021-04-27: 30 mL via ORAL
  Filled 2021-04-27: qty 236

## 2021-04-27 NOTE — Progress Notes (Signed)
Informed Dr. Algis Liming verbally when he was on rounds that pt had some beats of what may have been VTach according to Central Telemetry and from my looking at the strip also earlier this am.

## 2021-04-27 NOTE — Progress Notes (Signed)
Mobility Specialist Progress Note:   04/27/21 1500  Mobility  Activity Ambulated in hall  Level of Assistance Standby assist, set-up cues, supervision of patient - no hands on  Assistive Device Front wheel walker  Distance Ambulated (ft) 220 ft  Mobility Out of bed for toileting;Ambulated with assistance in hallway  Mobility Response Tolerated well  Mobility performed by Mobility specialist  $Mobility charge 1 Mobility   Pt required no physical assist throughout session. Displayed SOB during ambulation, however no longer requiring seated rest break. Still with c/o of diarrhea, requiring BSC trip when back in room. Pt back in bed with bed alarm on.   Nelta Numbers Mobility Specialist  Phone [phone]

## 2021-04-27 NOTE — Progress Notes (Signed)
Mobility Specialist Progress Note:   04/27/21 1200  Mobility  Activity Ambulated to bathroom  Level of Assistance Standby assist, set-up cues, supervision of patient - no hands on  Assistive Device Front wheel walker  Distance Ambulated (ft) 30 ft  Mobility Out of bed for toileting  Mobility Response Tolerated well  Mobility performed by Mobility specialist  $Mobility charge 1 Mobility   Pt asx during ambulation. Pt with increased blood on pad today, RN made aware. Back in bed with RN in room.   Nelta Numbers Mobility Specialist  Phone [phone]

## 2021-04-27 NOTE — TOC Progression Note (Signed)
Transition of Care [facility]) - Progression Note    Patient Details  Name: Debbie [name] MRN: [id number] Date of Birth: [birth year]  Transition of Care [facility]) CM/SW Contact  Jacalyn Lefevre Edson Snowball, RN Phone Number: 04/27/2021, 11:02 AM  Clinical Narrative:      Transition of Care (TOC) Screening Note   Patient Details  Name: Debbie [name] Date of Birth: 10-19-1960   Received message from nurse that MD wants patient to have her INR checked at a coumadin clinic on Tuesday May 01, 2021 .   Discussed with patient. She has her coumadin checked at Dr Cammy Copa office in [place]. NCM called and scheduled May 01, 2021 at 0845 am. Information placed on AVS.   Patient asking for education on caring for her nephrostomy tubes and if her daughter can be educated also. NCM made nurse aware    Transition of Care Department [facility]) has reviewed patient and no TOC needs have been identified at this time. We will continue to monitor patient advancement through interdisciplinary progression rounds. If new patient transition needs arise, please place a TOC consult.      Barriers to Discharge: Continued Medical Work up  Expected Discharge Plan and Services                                                 Social Determinants of Health (SDOH) Interventions    Readmission Risk Interventions No flowsheet data found.

## 2021-04-27 NOTE — Progress Notes (Signed)
Mobility Specialist Progress Note:   04/27/21 1300  Mobility  Activity Transferred to/from [facility]  Level of Assistance Standby assist, set-up cues, supervision of patient - no hands on  Assistive Device Front wheel walker  Distance Ambulated (ft) 2 ft  Mobility Out of bed for toileting  Mobility Response Tolerated well  Mobility performed by Mobility specialist  $Mobility charge 1 Mobility   Pt with diarrhea again today. Asx during transfer, pt back in bed with bed alarm on.   Nelta Numbers Mobility Specialist  Phone [phone]'

## 2021-04-27 NOTE — Progress Notes (Signed)
[place] for IV Heparin/Coumadin Indication:  Hx DVT/PE and malignancy  No Known Allergies  Patient Measurements: Weight: (!) 136.5 kg (301 lb) Ideal body weight 50 kg Heparin Dosing Weight: 84.7 kg  Vital Signs: Temp: 98 F (36.7 C) (01/13 0818) BP: 108/64 (01/13 0818) Pulse Rate: 85 (01/13 0818)  Labs: Recent Labs    04/25/21 0039 04/26/21 0101 04/26/21 1618 04/27/21 0208  HGB 7.8* 7.8*  --  7.5*  HCT 25.5* 25.1*  --  24.9*  PLT 463* 384  --  355  LABPROT 16.6* 16.4*  --  17.6*  INR 1.3* 1.3*  --  1.5*  HEPARINUNFRC 0.43 0.52  --  0.46  CREATININE 4.88* 3.45* 3.02* 2.78*     Estimated Creatinine Clearance: 28.1 mL/min (A) (by C-G formula based on SCr of 2.78 mg/dL (H)).   Assessment: 61 years of age female on Coumadin prior to admission who was admitted with INR of 8.5. Patient now s/p several doses of Vitamin K 2mg  po and INR down to 1.3. MD consulted pharmacy to dose IV heparin due to high clot risk with history of Afib, DVT/PE and malignancy. Coumadin resumed 1/10. She is s/p bilateral nephrostomy tubes on 1/7.  Heparin level is therapeutic and stable on 1600 units/hr. INR remains low at 1.5 post reversal. Hgb low stable 7s, platelets are normal; no bleeding issues reported.  PTA Coumadin 10mg  qday except 15mg  MWF  Goal of Therapy:  Heparin level 0.3-0.7 units/ml Monitor platelets by anticoagulation protocol: Yes   Plan:  Continue IV heparin at 1600 units/hr Coumadin 15mg  PO today Daily heparin level, INR and CBC  Monitor for s/sx of bleeding  Thank you for involving pharmacy in this patient's care.  Renold Genta, PharmD, BCPS Clinical Pharmacist Clinical phone for 04/27/2021 until 3p is [phone] 04/27/2021 10:23 AM  **Pharmacist phone directory can be found on [email or web address] listed under [place]**

## 2021-04-27 NOTE — Progress Notes (Signed)
PROGRESS NOTE  Debbie [name] INO:[id number] DOB: April 12, 1961 DOA: 04/18/2021 PCP: [facility], [place]  Brief History   Debbie [name] is a 61 y.o. female with medical history significant for seizure disorder, pulmonary embolism on warfarin, atrial fibrillation, hypertension, who was sent to ED by the warfarin clinic for elevated INR.   -Pt reported 69-month history of vaginal bleeding.  She was given megestrol prescription about 5 days prior to admission.  She also complained of generalized weakness, fatigue shortness of breath and 50 pound weight loss over a 104-month period. At [facility] the patient was found to have a creatinine of 14.34. Imaging revealed hydronephrosis and a distended bladder and uterus, retroperitoneal lymphadenopathy.  - Urology at [facility] performed cystoscopy, noted to have an infiltrative bladder mass involving the trigone, ureteral orifice could not be identified, biopsies with fulguration performed  -The patient was sent to [facility] for placement of bilateral percutaneous nephrostomy tubes which was done by IR on 04/21/21.  -Nephrology was consulted.  AKI improved with bilateral percutaneous nephrostomies and IV fluid hydration.  Nephrology signed off 1/11.   A & P   AKI, obstructive uropathy, bilateral hydronephrosis infiltrative bladder mass Cr 16 on presentation, continues to steadily improve.  Creatinine down to 3.45. Foley catheter is in place but per RN, without drainage-May be related to bilateral ureteral obstruction -Seen by urology at [facility], cystoscopy noted infiltrative bladder mass, s/p bladder biopsy with fulguration, biopsy was inconclusive-Path noted papillary fragments of urothelial mucosa with focal inflammation and mild atypia -Interventional radiology consulted, s/p bilateral nephrostomy tubes placement on 1/7.  Right PCN was not draining as well as left PCN, IR evaluated on 1/12 and feel that is due to atrophic right kidney.    -Nephrology consulted.  AKI secondary to obstructive uropathy with bilateral hydronephrosis and hydroureter.  Nephrology signed off 1/11. -Continues to have brisk urinary output, continue normal saline at 75 mL/h. -Appears to be in post obstructive diuresis phase with associated severe hypokalemia.  Monitor BMP daily.  Creatinine down to 2.78. -Mobilize and follow. -Dr. Broadus John d/w [place] 1/11, he recommended GYN FU and DC with PCNs and FU with him in 2-3weeks, may need repeat Cysto-biopsy if GYn workup is unremarkable - Discussed with Dr. Alyson Ingles, 1/11 and since Foley catheter has no urine output, discontinued Foley.  History of vaginal bleeding -CT noted to have distended uterus -Patient is a follow-up with GYN in [place] on 1/16 -Still having ongoing vaginal bleeding, however hemoglobin appears to be stable in the 7 g range for the last several days.  Acute on chronic anemia/folate deficiency. -Secondary to above issues -Hemoglobin has been stable in the 7 g range for the last 3 days.  Continue to monitor closely and transfuse if hemoglobin 7 g or less. -Anemia panel significant for iron 55, TIBC 266, saturation ratio 21, ferritin 186, folate 3.7 and B12: 328. -Started folate 1 Mg daily.  Leukocytosis -Mild, likely reactive secondary to above issues, afebrile and nontoxic, monitor  History of DVT/PE, paroxysmal atrial fibrillation -Remains on IV heparin, Coumadin was on hold but was restarted 1/11.  INR 1.5.  A. fib is rate controlled.  RN reported questionable NSVT.  Reviewed telemetry and on 1/13 at approximately 7:13 AM, had what appeared to me as a artifact.  I in turn reviewed this with a cardiologist who confirmed that this was most likely an artifact.  Hypokalemia/hypomagnesemia Replaced aggressively.  Continue to follow daily.   Coumadin coagulopathy INR was 8.5 on  admission.  S/p IV vitamin K Heparin drip resumed following procedure -Coumadin restarted 1/11.  INR  1.5.   Hypertension Continue atenolol.  Controlled.  Type II DM SSI, Accu-Cheks, hypoglycemic protocol -CBGs are stable  Seizure disorder Continue phenobarbital   DVT prophylaxis: Heparin gtt bridging with Coumadin. CODE STATUS: Full Code Family Communication: None at bedside. Disposition: Home in 1-2 days and pending therapeutic INR.    Consultants  Interventional radiology Urology Nephrology  Procedures  Cystoscopy with biopsies and fulgration Dr.McKenzie at [facility] Bilateral percutaneous nephrostomy tube placement on 04/21/2021 Dr.Shick  Antibiotics   Anti-infectives (From admission, onward)    Start     Dose/Rate Route Frequency Ordered Stop   04/21/21 1006  sodium chloride 0.9 % with cefTRIAXone (ROCEPHIN) ADS Med       Note to Pharmacy: Fredric Dine: cabinet override      04/21/21 1006 04/21/21 2214   04/21/21 1000  cefTRIAXone (ROCEPHIN) 2 g in sodium chloride 0.9 % 100 mL IVPB  Status:  Discontinued        2 g 200 mL/hr over 30 Minutes Intravenous On call 04/20/21 1827 04/20/21 1828   04/21/21 1000  cefTRIAXone (ROCEPHIN) 2 g in sodium chloride 0.9 % 100 mL IVPB        2 g 200 mL/hr over 30 Minutes Intravenous On call 04/20/21 1828 04/21/21 1228   04/21/21 0700  cefTRIAXone (ROCEPHIN) 2 g in sodium chloride 0.9 % 100 mL IVPB  Status:  Discontinued        2 g 200 mL/hr over 30 Minutes Intravenous On call 04/20/21 1656 04/20/21 1827   04/20/21 1400  cefTRIAXone (ROCEPHIN) 2 g in sodium chloride 0.9 % 100 mL IVPB  Status:  Discontinued        2 g 200 mL/hr over 30 Minutes Intravenous On call 04/20/21 0956 04/20/21 1656   04/19/21 1145  ceFAZolin (ANCEF) IVPB 3g/100 mL premix        3 g 200 mL/hr over 30 Minutes Intravenous  Once 04/19/21 1135 04/19/21 1250      Subjective  Seen this morning.  Delighted that the Foley catheter was removed.  Had some indigestion and abdominal discomfort yesterday, improved after a small BM yesterday followed by a large BM.   Denies abdominal pain.  No other complaints reported.  Still has some vaginal bleeding and pad.  Unable to quantify.   Objective   Vitals:  Vitals:   04/27/21 0818 04/27/21 1605  BP: 108/64 (!) 113/59  Pulse: 85 85  Resp: 18 18  Temp: 98 F (36.7 C) 98.5 F (36.9 C)  SpO2: 100% 100%    Exam:  General exam: Middle-age female, moderately built and obese sitting up comfortably in bed. Respiratory system: Clear to auscultation.  No increased work of breathing. Cardiovascular system: S1 & S2 heard, irregularly irregular. No JVD, murmurs, rubs, gallops or clicks.  Trace bilateral leg edema.  Telemetry personally reviewed: A. fib with controlled ventricular rate. Gastrointestinal system: Abdomen is nondistended/protuberant, soft and nontender. No organomegaly or masses felt. Normal bowel sounds heard. Genitourinary: Bilateral PCNs in place. Central nervous system: Alert and oriented. No focal neurological deficits. Extremities: Symmetric 5 x 5 power. Skin: No rashes, lesions or ulcers Psychiatry: Judgement and insight appear normal. Mood & affect appropriate.     Scheduled Meds:  atenolol  50 mg Oral Daily   Chlorhexidine Gluconate Cloth  6 each Topical Daily   folic acid  1 mg Oral Daily   insulin aspart  0-15  Units Subcutaneous TID WC   insulin aspart  0-5 Units Subcutaneous QHS   pantoprazole  40 mg Oral Daily   PHENobarbital  97.2 mg Oral BID   warfarin  15 mg Oral ONCE-1600   Warfarin - Pharmacist Dosing Inpatient   Does not apply q1600   Continuous Infusions:  heparin 1,600 Units/hr (04/27/21 0712)    Principal Problem:   AKI (acute kidney injury) ([place]) Active Problems:   Acute pulmonary embolism (HCC)   Acute deep vein thrombosis (DVT) of right lower extremity (HCC)   Unspecified atrial fibrillation (HCC)   HTN (hypertension)   Seizure disorder ([place])   LOS: 9 days    Vernell Leep, MD,  FACP, [facility], [facility], [facility] (Care Management Physician Certified) [place]  To contact the attending provider between 7A-7P or the covering provider during after hours 7P-7A, please log into the web site [email or web address] and access using universal [place] password for that web site. If you do not have the password, please call the hospital operator.

## 2021-04-28 LAB — HEPARIN LEVEL (UNFRACTIONATED): Heparin Unfractionated: 0.47 IU/mL (ref 0.30–0.70)

## 2021-04-28 LAB — BASIC METABOLIC PANEL
Anion gap: 10 (ref 5–15)
BUN: 13 mg/dL (ref 8–23)
CO2: 18 mmol/L — ABNORMAL LOW (ref 22–32)
Calcium: 8.5 mg/dL — ABNORMAL LOW (ref 8.9–10.3)
Chloride: 114 mmol/L — ABNORMAL HIGH (ref 98–111)
Creatinine, Ser: 2.44 mg/dL — ABNORMAL HIGH (ref 0.44–1.00)
GFR, Estimated: 22 mL/min — ABNORMAL LOW (ref >60)
Glucose, Bld: 99 mg/dL (ref 70–99)
Potassium: 3.7 mmol/L (ref 3.5–5.1)
Sodium: 142 mmol/L (ref 135–145)

## 2021-04-28 LAB — PROTIME-INR
INR: 1.7 — ABNORMAL HIGH (ref 0.8–1.2)
Prothrombin Time: 20.2 seconds — ABNORMAL HIGH (ref 11.4–15.2)

## 2021-04-28 LAB — CBC
HCT: 24.6 % — ABNORMAL LOW (ref 36.0–46.0)
Hemoglobin: 7.3 g/dL — ABNORMAL LOW (ref 12.0–15.0)
MCH: 29.2 pg (ref 26.0–34.0)
MCHC: 29.7 g/dL — ABNORMAL LOW (ref 30.0–36.0)
MCV: 98.4 fL (ref 80.0–100.0)
Platelets: 336 10*3/uL (ref 150–400)
RBC: 2.5 MIL/uL — ABNORMAL LOW (ref 3.87–5.11)
RDW: 15.7 % — ABNORMAL HIGH (ref 11.5–15.5)
WBC: 10.2 10*3/uL (ref 4.0–10.5)
nRBC: 0 % (ref 0.0–0.2)

## 2021-04-28 LAB — PREPARE RBC (CROSSMATCH)

## 2021-04-28 LAB — HEMOGLOBIN AND HEMATOCRIT, BLOOD
HCT: 26.6 % — ABNORMAL LOW (ref 36.0–46.0)
Hemoglobin: 8.4 g/dL — ABNORMAL LOW (ref 12.0–15.0)

## 2021-04-28 LAB — GLUCOSE, CAPILLARY
Glucose-Capillary: 94 mg/dL (ref 70–99)
Glucose-Capillary: 91 mg/dL (ref 70–99)
Glucose-Capillary: 103 mg/dL — ABNORMAL HIGH (ref 70–99)
Glucose-Capillary: 114 mg/dL — ABNORMAL HIGH (ref 70–99)

## 2021-04-28 LAB — MAGNESIUM: Magnesium: 1.5 mg/dL — ABNORMAL LOW (ref 1.7–2.4)

## 2021-04-28 MED ORDER — POTASSIUM CHLORIDE CRYS ER 20 MEQ PO TBCR
40.0000 meq | EXTENDED_RELEASE_TABLET | Freq: Once | ORAL | Status: AC
  Administered 2021-04-28: 40 meq via ORAL
  Filled 2021-04-28: qty 2

## 2021-04-28 MED ORDER — SODIUM CHLORIDE 0.9% IV SOLUTION: Freq: Once | INTRAVENOUS | Status: DC

## 2021-04-28 MED ORDER — SODIUM CHLORIDE 0.9 % IV SOLN: INTRAVENOUS | Status: AC

## 2021-04-28 MED ORDER — WARFARIN SODIUM 5 MG PO TABS
15.0000 mg | ORAL_TABLET | Freq: Once | ORAL | Status: AC
  Administered 2021-04-28: 15 mg via ORAL
  Filled 2021-04-28: qty 3

## 2021-04-28 MED ORDER — MAGNESIUM SULFATE 2 GM/50ML IV SOLN
2.0000 g | Freq: Once | INTRAVENOUS | Status: AC
  Administered 2021-04-28: 2 g via INTRAVENOUS
  Filled 2021-04-28: qty 50

## 2021-04-28 NOTE — Progress Notes (Signed)
PROGRESS NOTE  Debbie [name] IZT:[id number] DOB: January 16, 1961 DOA: 04/18/2021 PCP: [facility] [place]  Brief History   Debbie [name] is a 61 y.o. female with medical history significant for seizure disorder, pulmonary embolism on warfarin, atrial fibrillation, hypertension, who was sent to ED by the warfarin clinic for elevated INR.   -Pt reported 33-month history of vaginal bleeding.  She was given megestrol prescription about 5 days prior to admission.  She also complained of generalized weakness, fatigue shortness of breath and 50 pound weight loss over a 35-month period. At [facility] the patient was found to have a creatinine of 14.34. Imaging revealed hydronephrosis and a distended bladder and uterus, retroperitoneal lymphadenopathy.  - Urology at [facility] performed cystoscopy, noted to have an infiltrative bladder mass involving the trigone, ureteral orifice could not be identified, biopsies with fulguration performed  -The patient was sent to [facility] for placement of bilateral percutaneous nephrostomy tubes which was done by IR on 04/21/21.  -Nephrology was consulted.  AKI improved with bilateral percutaneous nephrostomies and IV fluid hydration.  Nephrology signed off 1/11.   A & P   AKI, obstructive uropathy, bilateral hydronephrosis infiltrative bladder mass Cr 16 on presentation.   Foley catheter was placed but without drainage, likely due to bilateral ureteral obstruction.  Discontinued after discussing with urology. -Seen by urology at [facility], cystoscopy noted infiltrative bladder mass, s/p bladder biopsy with fulguration, biopsy was inconclusive-Path noted papillary fragments of urothelial mucosa with focal inflammation and mild atypia -Interventional radiology consulted, s/p bilateral nephrostomy tubes placement on 1/7.  Right PCN was not draining as well as left PCN, IR evaluated on 1/12 and feel that is due to atrophic right kidney.   -Nephrology consulted.  AKI  secondary to obstructive uropathy with bilateral hydronephrosis and hydroureter.  Nephrology signed off 1/11. -Treating with IV fluids, replacing electrolytes as needed, having brisk diuresis.  Creatinine steadily improving, down to 2.44 on 1/14.  Reduced IV fluids to 50 mL/h and hopefully can DC in a.m. -Dr. Broadus John d/w Dr.Mckenzie 1/11, he recommended GYN FU and DC with PCNs and FU with him in 2-3weeks, may need repeat Cysto-biopsy if GYn workup is unremarkable  History of vaginal bleeding -CT noted to have distended uterus -Patient is a follow-up with GYN in [place] on 1/16 at 3 PM.  Patient also reports that if she is unable to make that appointment, she can easily reschedule -Still having ongoing mild vaginal bleeding.  Acute on chronic anemia/folate deficiency. -Secondary to above issues -Anemia panel significant for iron 55, TIBC 266, saturation ratio 21, ferritin 186, folate 3.7 and B12: 328. -Started folate 1 Mg daily. -Hemoglobin slowly trending down.  Given ongoing vaginal bleeding albeit minimal, phlebotomies, chronic disease, worry that it will drop further and patient will become symptomatic.  Therefore transfusing 1 more unit of PRBC.  Patient consented.  Follow CBC in AM.  Leukocytosis -Likely reactive.  Resolved.  History of DVT/PE, paroxysmal atrial fibrillation -Remains on IV heparin, Coumadin was on hold but was restarted 1/11.  INR 1.7 A. fib is rate controlled.  RN reported questionable NSVT.  Reviewed telemetry and on 1/13 at approximately 7:13 AM, had what appeared to me as a artifact.  I in turn reviewed this with a cardiologist who confirmed that this was most likely an artifact.  Hypokalemia/hypomagnesemia Continue to replace and follow.   Coumadin coagulopathy INR was 8.5 on admission.  S/p IV vitamin K Heparin drip resumed following procedure -Coumadin restarted 1/11.  INR 1.7. -Hopefully INR will be therapeutic tomorrow and can be discharged home.  Has  outpatient follow-up at the Coumadin clinic on 1/17.  If not therapeutic tomorrow, can even keep overnight tomorrow and discharge early morning on 1/16 so she can keep up with the GYN oncology appointment in the afternoon.   Hypertension Continue atenolol.  Controlled.  Type II DM SSI, Accu-Cheks, hypoglycemic protocol -CBGs are stable A1c 5.7.  DC CBG checks and SSI.  Seizure disorder Continue phenobarbital   DVT prophylaxis: Heparin gtt bridging with Coumadin. CODE STATUS: Full Code Family Communication: None at bedside. Disposition: Home in 1-2 days and pending therapeutic INR.    Consultants  Interventional radiology Urology Nephrology  Procedures  Cystoscopy with biopsies and fulgration Dr.McKenzie at [facility] Bilateral percutaneous nephrostomy tube placement on 04/21/2021 Dr.Shick  Antibiotics   Anti-infectives (From admission, onward)    Start     Dose/Rate Route Frequency Ordered Stop   04/21/21 1006  sodium chloride 0.9 % with cefTRIAXone (ROCEPHIN) ADS Med       Note to Pharmacy: Fredric Dine: cabinet override      04/21/21 1006 04/21/21 2214   04/21/21 1000  cefTRIAXone (ROCEPHIN) 2 g in sodium chloride 0.9 % 100 mL IVPB  Status:  Discontinued        2 g 200 mL/hr over 30 Minutes Intravenous On call 04/20/21 1827 04/20/21 1828   04/21/21 1000  cefTRIAXone (ROCEPHIN) 2 g in sodium chloride 0.9 % 100 mL IVPB        2 g 200 mL/hr over 30 Minutes Intravenous On call 04/20/21 1828 04/21/21 1228   04/21/21 0700  cefTRIAXone (ROCEPHIN) 2 g in sodium chloride 0.9 % 100 mL IVPB  Status:  Discontinued        2 g 200 mL/hr over 30 Minutes Intravenous On call 04/20/21 1656 04/20/21 1827   04/20/21 1400  cefTRIAXone (ROCEPHIN) 2 g in sodium chloride 0.9 % 100 mL IVPB  Status:  Discontinued        2 g 200 mL/hr over 30 Minutes Intravenous On call 04/20/21 0956 04/20/21 1656   04/19/21 1145  ceFAZolin (ANCEF) IVPB 3g/100 mL premix        3 g 200 mL/hr over 30 Minutes  Intravenous  Once 04/19/21 1135 04/19/21 1250      Subjective  Denies complaints.  Reports that she has been ambulating with supervision.  Ongoing mild vaginal bleeding.  No dizziness, lightheadedness, chest pain or dyspnea.   Objective   Vitals:  Vitals:   04/28/21 0523 04/28/21 0757  BP: 106/65 126/65  Pulse: 60 84  Resp: 18 17  Temp: 98 F (36.7 C) 99 F (37.2 C)  SpO2: 96% 100%    Exam:  General exam: Middle-age female, moderately built and obese lying comfortably propped up in bed without distress. Respiratory system: Clear to auscultation.  No increased work of breathing. Cardiovascular system: S1 and S2 heard, irregularly irregular, no JVD or murmurs or pedal edema.  Telemetry personally reviewed: A. fib with controlled ventricular rate.  No further concerns for arrhythmias. Gastrointestinal system: Abdomen is nondistended/protuberant, soft and nontender. No organomegaly or masses felt. Normal bowel sounds heard. Genitourinary: Bilateral PCNs in place. Central nervous system: Alert and oriented. No focal neurological deficits. Extremities: Symmetric 5 x 5 power. Skin: No rashes, lesions or ulcers Psychiatry: Judgement and insight appear normal. Mood & affect appropriate.     Scheduled Meds:  sodium chloride   Intravenous Once   atenolol  50 mg  Oral Daily   Chlorhexidine Gluconate Cloth  6 each Topical Daily   folic acid  1 mg Oral Daily   insulin aspart  0-15 Units Subcutaneous TID WC   insulin aspart  0-5 Units Subcutaneous QHS   pantoprazole  40 mg Oral Daily   PHENobarbital  97.2 mg Oral BID   warfarin  15 mg Oral ONCE-1600   Warfarin - Pharmacist Dosing Inpatient   Does not apply q1600   Continuous Infusions:  sodium chloride 50 mL/hr at 04/28/21 0902   heparin 1,600 Units/hr (04/28/21 0001)    Principal Problem:   AKI (acute kidney injury) ([place]) Active Problems:   Acute pulmonary embolism (HCC)   Acute deep vein thrombosis (DVT) of right lower  extremity (HCC)   Unspecified atrial fibrillation (HCC)   HTN (hypertension)   Seizure disorder ([place])   LOS: 10 days    Vernell Leep, MD,  FACP, [facility], [facility], [facility] (Care Management Physician Certified) [place]  To contact the attending provider between 7A-7P or the covering provider during after hours 7P-7A, please log into the web site [email or web address] and access using universal [place] password for that web site. If you do not have the password, please call the hospital operator.

## 2021-04-28 NOTE — Progress Notes (Signed)
ANTICOAGULATION CONSULT NOTE  Pharmacy Consult for IV Heparin/Coumadin Indication:  Hx DVT/PE, atrial fibrillation, concern for malignancy  No Known Allergies  Patient Measurements: Weight: (!) 136.5 kg (301 lb) Ideal Body Weight: 50 kg Heparin Dosing Weight: 84.7 kg  Vital Signs: Temp: 98 F (36.7 C) (01/14 0523) BP: 106/65 (01/14 0523) Pulse Rate: 60 (01/14 0523)  Labs: Recent Labs    04/26/21 0101 04/26/21 1618 04/27/21 0208 04/28/21 0324  HGB 7.8*  --  7.5* 7.3*  HCT 25.1*  --  24.9* 24.6*  PLT 384  --  355 336  LABPROT 16.4*  --  17.6* 20.2*  INR 1.3*  --  1.5* 1.7*  HEPARINUNFRC 0.52  --  0.46 0.47  CREATININE 3.45* 3.02* 2.78* 2.44*    Estimated Creatinine Clearance: 32 mL/min (A) (by C-G formula based on SCr of 2.44 mg/dL (H)).  Assessment: 61 yo female on warfarin prior to admission was admitted with INR of 8.5. The patient is now s/p several doses of Vitamin K, INR down to 1.3. On 1/7, the patient had bilateral nephrostomy tubes placed. MD consulted pharmacy to dose IV heparin due to high clot risk with history of Afib, DVT/PE and malignancy. Warfarin resumed on 1/10.  PTA warfarin dose: 10 mg daily except 15 mg MWF  INR today is subtherapeutic at 1.7 s/p 15 mg yesterday, but saw appropriate rise in INR. The heparin level is therapeutic at 0.47 while on heparin at 1600 units/hr. Per the RN, there have been no issues with the heparin infusion and there were no bleeding issues reported. No new interacting medications were started, and dietary intake remains stable. Hgb low and stable at 7.3, platelets stable at 336.   Goal of Therapy:  INR 2-3 Heparin level 0.3-0.7 units/ml Monitor platelets by anticoagulation protocol: Yes   Plan:  Continue heparin IV at 1600 units/hr Give warfarin PO 15 mg x 1 dose tonight  Obtain a daily heparin level, PT/INR, and CBC Monitor for signs and symptoms of bleeding Watch for drug interactions and dietary changes  Thank you  for involving pharmacy in this patient's care.  Shauna Hugh, PharmD, [place]  PGY-2 Pharmacy Resident 04/28/2021 7:33 AM  Please check [email or web address] for unit-specific pharmacy phone numbers.

## 2021-04-29 DIAGNOSIS — R791 Abnormal coagulation profile: Secondary | ICD-10-CM

## 2021-04-29 LAB — TYPE AND SCREEN
ABO/RH(D): A POS
Antibody Screen: NEGATIVE
Unit division: 0

## 2021-04-29 LAB — BPAM RBC
Blood Product Expiration Date: 202302072359
ISSUE DATE / TIME: 202301141313
Unit Type and Rh: 6200

## 2021-04-29 LAB — PROTIME-INR
INR: 2.1 — ABNORMAL HIGH (ref 0.8–1.2)
Prothrombin Time: 23.5 seconds — ABNORMAL HIGH (ref 11.4–15.2)

## 2021-04-29 LAB — CBC
HCT: 26.9 % — ABNORMAL LOW (ref 36.0–46.0)
Hemoglobin: 8.4 g/dL — ABNORMAL LOW (ref 12.0–15.0)
MCH: 29.5 pg (ref 26.0–34.0)
MCHC: 31.2 g/dL (ref 30.0–36.0)
MCV: 94.4 fL (ref 80.0–100.0)
Platelets: 294 10*3/uL (ref 150–400)
RBC: 2.85 MIL/uL — ABNORMAL LOW (ref 3.87–5.11)
RDW: 16.7 % — ABNORMAL HIGH (ref 11.5–15.5)
WBC: 10.3 10*3/uL (ref 4.0–10.5)
nRBC: 0 % (ref 0.0–0.2)

## 2021-04-29 LAB — BASIC METABOLIC PANEL
Anion gap: 5 (ref 5–15)
BUN: 12 mg/dL (ref 8–23)
CO2: 20 mmol/L — ABNORMAL LOW (ref 22–32)
Calcium: 8.2 mg/dL — ABNORMAL LOW (ref 8.9–10.3)
Chloride: 115 mmol/L — ABNORMAL HIGH (ref 98–111)
Creatinine, Ser: 2.29 mg/dL — ABNORMAL HIGH (ref 0.44–1.00)
GFR, Estimated: 24 mL/min — ABNORMAL LOW (ref >60)
Glucose, Bld: 96 mg/dL (ref 70–99)
Potassium: 4 mmol/L (ref 3.5–5.1)
Sodium: 140 mmol/L (ref 135–145)

## 2021-04-29 LAB — HEPARIN LEVEL (UNFRACTIONATED): Heparin Unfractionated: >1.1 IU/mL — ABNORMAL HIGH (ref 0.30–0.70)

## 2021-04-29 LAB — GLUCOSE, CAPILLARY: Glucose-Capillary: 79 mg/dL (ref 70–99)

## 2021-04-29 LAB — MAGNESIUM: Magnesium: 1.6 mg/dL — ABNORMAL LOW (ref 1.7–2.4)

## 2021-04-29 MED ORDER — WARFARIN SODIUM 5 MG PO TABS
10.0000 mg | ORAL_TABLET | Freq: Once | ORAL | Status: AC
  Administered 2021-04-29: 10 mg via ORAL
  Filled 2021-04-29: qty 2

## 2021-04-29 MED ORDER — FOLIC ACID 1 MG PO TABS: 1.0000 mg | ORAL_TABLET | Freq: Every day | ORAL | 0 refills | Status: DC

## 2021-04-29 MED ORDER — MAGNESIUM SULFATE 4 GM/100ML IV SOLN
4.0000 g | Freq: Once | INTRAVENOUS | Status: AC
  Administered 2021-04-29: 4 g via INTRAVENOUS
  Filled 2021-04-29: qty 100

## 2021-04-29 MED ORDER — ACETAMINOPHEN 325 MG PO TABS: 650.0000 mg | ORAL_TABLET | Freq: Four times a day (QID) | ORAL | Status: DC | PRN

## 2021-04-29 NOTE — Discharge Summary (Signed)
Physician Discharge Summary  Debbie [name] IFO:[id number] DOB: [birth year]  PCP: Sandria Manly [place]  Admitted from: Home Discharged to: Home  Admit date: 04/18/2021 Discharge date: 04/29/2021  Recommendations for Outpatient Follow-up:    Follow-up Information     Branch, Alphonse Guild, MD Follow up.   Specialty: Cardiology Why: May 01, 2021 at [place] [facility] Contact information: [place] [address] [place], [facility]. Schedule an appointment as soon as possible for a visit in 1 week(s).   Why: To be seen with repeat labs (CBC, BMP & magnesium). Contact information: [address] [place] [address] [phone]         Arnoldo Lenis, MD .   Specialty: Cardiology Contact information: [place] [address] [phone]         Cleon Gustin, MD. Schedule an appointment as soon as possible for a visit in 2 week(s).   Specialty: Urology Why: Call for an apppointment. Contact information: [place] [address] [address] [phone]                Patient has a follow-up appointment at the Coumadin clinic on 1/17 at 8:45 AM morning and with GYN, Dr. Elonda Husky on 1/17 at 3:10 PM.  Home Health:  [facility] Orders (From admission, onward)     Start     Ordered   04/29/21 [place]  At discharge       Comments: Patient/family will need education and assistance regarding management of bilateral percutaneous nephrostomies.  Question:  To provide the following care/treatments  Answer:  RN   04/29/21 1219             Equipment/Devices: None.    Discharge Condition: Improved and stable   Code Status: Full Code Diet recommendation:  Discharge Diet Orders (From admission, onward)     Start     Ordered   04/29/21 0000  Diet - low sodium heart healthy        04/29/21 1219   04/29/21 0000  Diet Carb Modified         04/29/21 1219             Discharge Diagnoses:  Principal Problem:   AKI (acute kidney injury) ([place]) Active Problems:   Acute pulmonary embolism ([place])   Acute deep vein thrombosis (DVT) of right lower extremity (HCC)   Unspecified atrial fibrillation (HCC)   HTN (hypertension)   Seizure disorder ([place])   Brief Summary: Debbie [name] is a 61 y.o. female with medical history significant for seizure disorder, pulmonary embolism on warfarin, atrial fibrillation, hypertension, who was sent to ED by the warfarin clinic for elevated INR.   -Pt reported 72-month history of vaginal bleeding.  She was given megestrol prescription about 5 days prior to admission.  She also complained of generalized weakness, fatigue shortness of breath and 50 pound weight loss over a 64-month period. At [facility] the patient was found to have a creatinine of 14.34. Imaging revealed hydronephrosis and a distended bladder and uterus, retroperitoneal lymphadenopathy.  - Urology at [facility] performed cystoscopy, noted to have an infiltrative bladder mass involving the trigone, ureteral orifice could not be identified, biopsies with fulguration performed  -The patient was sent to [facility] for placement of bilateral percutaneous nephrostomy tubes which was done by IR on 04/21/21.  -  Nephrology was consulted.  AKI improved with bilateral percutaneous nephrostomies and IV fluid hydration.  Nephrology signed off 1/11.     A & P    AKI, obstructive uropathy, bilateral hydronephrosis infiltrative bladder mass Cr 16 on presentation.   Foley catheter was placed but without drainage, likely due to bilateral ureteral obstruction.  Discontinued after discussing with urology. -Seen by urology at [facility], cystoscopy noted infiltrative bladder mass, s/p bladder biopsy with fulguration, biopsy was inconclusive-Path noted papillary fragments of urothelial mucosa with focal inflammation and mild atypia -Interventional radiology  consulted, s/p bilateral nephrostomy tubes placement on 1/7.  Right PCN was not draining as well as left PCN, IR evaluated on 1/12 and feel that is due to atrophic right kidney.   -Nephrology consulted.  AKI secondary to obstructive uropathy with bilateral hydronephrosis and hydroureter.  Nephrology signed off 1/11. -Treated with IV fluids, replaced electrolytes as needed, having brisk diuresis.  Creatinine steadily improving, down to 2.29 on day of discharge.  Advised ad lib. oral fluid intake at home and avoid nephrotoxic's. -Dr. Broadus John d/w [place] 1/11, he recommended GYN FU and DC with PCNs and FU with him in 2-3weeks, may need repeat Cysto-biopsy if GYn workup is unremarkable   History of vaginal bleeding -CT noted to have distended uterus -Patient is a follow-up with GYN in [place] on 1/17 at 3.10 PM.  -Still having ongoing mild vaginal bleeding.  Resume Megace at discharge.   Acute on chronic anemia/folate deficiency. -Secondary to above issues -Anemia panel significant for iron 55, TIBC 266, saturation ratio 21, ferritin 186, folate 3.7 and B12: 328. -Started folate 1 Mg daily. -Hemoglobin slowly trending down.  Given ongoing vaginal bleeding albeit minimal, phlebotomies, chronic disease, worry that it will drop further and patient will become symptomatic.  Therefore transfused 1 more unit of PRBC on 1/14.  Hemoglobin is improved to 8.4. -Follow CBC as outpatient.   Leukocytosis -Likely reactive.  Resolved.   History of DVT/PE, paroxysmal atrial fibrillation -Remains on IV heparin, Coumadin was on hold but was restarted 1/11.  INR 2.1. A. fib is rate controlled.  RN reported questionable NSVT on 1/13.  Reviewed telemetry and on 1/13 at approximately 7:13 AM, had what appeared to me as a artifact.  I in turn reviewed this with a cardiologist who confirmed that this was most likely an artifact.   Hypokalemia/hypomagnesemia Hypokalemia has been replaced.  Replaced magnesium IV  prior to discharge.  Outpatient follow-up of BMP and magnesium.   Coumadin coagulopathy INR was 8.5 on admission.  S/p IV vitamin K Heparin drip resumed following procedure -Coumadin restarted 1/11.  INR 2.1. -INR is therapeutic.  As per pharmacy, patient may resume prior home customize dose of warfarin.  Patient takes 15 mg on Mondays, Wednesdays and Fridays and 10 mg on the other days.  She has a follow-up appointment with her Coumadin clinic on 1/17 for labs and dose can be further adjusted as needed.   Hypertension Continue atenolol.  Controlled.  Discontinued HCTZ due to acute renal failure.  Type II DM SSI, Accu-Cheks, hypoglycemic protocol -CBGs are stable A1c 5.7.  DC CBG checks and SSI. Discontinued metformin due to well-controlled DM and acute renal failure.  Continue to monitor CBGs at home and outpatient follow-up with PCP.  Seizure disorder Continue phenobarbital     Consultants  Interventional radiology Urology Nephrology   Procedures  Cystoscopy with biopsies and fulgration Dr.McKenzie at [facility] [place] Bilateral percutaneous nephrostomy tube placement on 04/21/2021 Dr.Shick  Discharge Instructions  Discharge Instructions     Call MD for:   Complete by: As directed    Recurrent or worsening vaginal bleeding.   Call MD for:  difficulty breathing, headache or visual disturbances   Complete by: As directed    Call MD for:  extreme fatigue   Complete by: As directed    Call MD for:  persistant dizziness or light-headedness   Complete by: As directed    Call MD for:  persistant nausea and vomiting   Complete by: As directed    Call MD for:  redness, tenderness, or signs of infection (pain, swelling, redness, odor or green/yellow discharge around incision site)   Complete by: As directed    Call MD for:  severe uncontrolled pain   Complete by: As directed    Call MD for:  temperature >100.4   Complete by: As directed    Diet - low sodium heart healthy   Complete  by: As directed    Diet Carb Modified   Complete by: As directed    Increase activity slowly   Complete by: As directed    No wound care   Complete by: As directed         Medication List     STOP taking these medications    hydrochlorothiazide 12.5 MG tablet Commonly known as: HYDRODIURIL   metFORMIN 500 MG 24 hr tablet Commonly known as: GLUCOPHAGE-XR       TAKE these medications    acetaminophen 325 MG tablet Commonly known as: TYLENOL Take 2 tablets (650 mg total) by mouth every 6 (six) hours as needed for mild pain or moderate pain.   atenolol 50 MG tablet Commonly known as: TENORMIN Take 50 mg by mouth daily.   cholecalciferol 25 MCG (1000 UNIT) tablet Commonly known as: VITAMIN D3 Take 1,000 Units by mouth daily.   folic acid 1 MG tablet Commonly known as: FOLVITE Take 1 tablet (1 mg total) by mouth daily. Start taking on: April 30, 2021   hydrocortisone 2.5 % cream Apply 1 application topically 2 (two) times daily.   megestrol 40 MG tablet Commonly known as: MEGACE Take 40 mg by mouth daily.   oxybutynin 10 MG 24 hr tablet Commonly known as: DITROPAN-XL Take 10 mg by mouth daily.   PHENobarbital 97.2 MG tablet Commonly known as: LUMINAL Take 97.2 mg by mouth 2 (two) times daily.   warfarin 5 MG tablet Commonly known as: COUMADIN Take as directed. If you are unsure how to take this medication, talk to your nurse or doctor. Original instructions: TAKE 2-3 TABLETS BY MOUTH ONCE DAILY AS DIRECTED BY COUMADIN CLINIC Notes to patient: You already had your warfarin dose for 1/15 in the hospital, do not take when you get home Take 15 mg (3 tablets) by mouth on 1/16       No Known Allergies    Procedures/Studies: CT ABDOMEN PELVIS WO CONTRAST  Result Date: 04/18/2021 CLINICAL DATA:  Generalized abdominal pain and bloating for 1 month, vaginal bleeding EXAM: CT ABDOMEN AND PELVIS WITHOUT CONTRAST TECHNIQUE: Multidetector CT imaging of the  abdomen and pelvis was performed following the standard protocol without IV contrast. COMPARISON:  None. FINDINGS: Lower chest: No acute pleural or parenchymal lung disease. Hepatobiliary: High attenuation material dependently within the gallbladder may reflect a noncalcified gallstone or biliary sludge. No evidence of acute cholecystitis. Unenhanced imaging of the liver is unremarkable. Pancreas: Unremarkable unenhanced appearance. Spleen: Unremarkable unenhanced appearance. Adrenals/Urinary Tract: Bilateral hydronephrosis  and hydroureter, without clear cause identified for obstruction. No ureteral or bladder calculi. The bladder is decompressed with a Foley catheter. Calcifications at the renal hila are likely vascular. Simple appearing cyst lower pole left kidney. The adrenals are unremarkable. Stomach/Bowel: No bowel obstruction or ileus. Normal appendix right lower quadrant. No bowel wall thickening or inflammatory change. Vascular/Lymphatic: Mild diffuse atherosclerosis of the aorta and its branches. Evaluation of the vascular lumen is limited without IV contrast. There are multiple enlarged retroperitoneal lymph nodes. Index lymph node in the left para-aortic region image 38/2 measures 14 mm in short axis. Left external iliac chain lymph node demonstrates increased density, measuring 17 mm reference image 66/2. Reproductive: The uterus is enlarged, with minimal high attenuation within the endometrial cavity which could reflect calcification or blood products. If further evaluation of the uterus is desired, pelvic ultrasound could be performed. There are no adnexal masses. Other: No free intra-abdominal fluid or free gas. No abdominal wall hernia. Musculoskeletal: Symmetrical sclerosis along the sacroiliac joints. Changes of osteitis pubis. No acute displaced fracture. Reconstructed images demonstrate no additional findings. IMPRESSION: 1. Bilateral hydronephrosis and hydroureter, without clear cause for  obstruction. No urinary tract calculi. 2. Enlarged uterus, with minimal high attenuation within the endometrial cavity, which could reflect blood products. Given clinical history of vaginal bleeding, follow-up pelvic ultrasound may be useful. 3. Retroperitoneal lymphadenopathy. 4. High attenuation material dependently within the gallbladder, consistent with noncalcified gallstones or sludge. No evidence of acute cholecystitis. 5.  Aortic Atherosclerosis (ICD10-I70.0). Electronically Signed   By: Randa Ngo M.D.   On: 04/18/2021 21:06   DG C-Arm 1-60 Min-No Report  Result Date: 04/19/2021 Fluoroscopy was utilized by the requesting physician.  No radiographic interpretation.   US PELVIC COMPLETE WITH TRANSVAGINAL  Result Date: 04/19/2021 CLINICAL DATA:  Uterine mass?  , History of vaginal bleeding. EXAM: TRANSABDOMINAL AND TRANSVAGINAL ULTRASOUND OF PELVIS TECHNIQUE: Both transabdominal and transvaginal ultrasound examinations of the pelvis were performed. Transabdominal technique was performed for global imaging of the pelvis including uterus, ovaries, adnexal regions, and pelvic cul-de-sac. It was necessary to proceed with endovaginal exam following the transabdominal exam to visualize the endometrium. COMPARISON:  CT examination dated April 18, 2021 FINDINGS: Uterus Measurements: 13.0 x 5.3 x 7.1 = volume: 238 mL. No fibroids or other mass visualized. Evaluation is however limited due to body habitus. Patient was not able to tolerate transvaginal examination. Endometrium Thickness: 9.7 mm.  No focal abnormality visualized. Right ovary Not visualized Left ovary Not visualized Other findings No abnormal free fluid. IMPRESSION: Heterogeneous uterus without appreciable mass. Prominent endometrium in this post menopausal woman. Further evaluation with MRI examination or direct visualization is recommended. Bilateral ovaries were not seen. Exam is limited due to patient's inability to tolerate transvaginal  examination. Electronically Signed   By: Keane Police D.O.   On: 04/19/2021 11:06   IR NEPHROSTOMY PLACEMENT LEFT  Result Date: 04/21/2021 INDICATION: Renal failure, bilateral obstructive hydronephrosis EXAM: ULTRASOUND FLUOROSCOPIC BILATERAL 10 FRENCH NEPHROSTOMIES COMPARISON:  04/18/2021 MEDICATIONS: 1% LIDOCAINE LOCAL ANESTHESIA/SEDATION: Moderate (conscious) sedation was employed during this procedure. A total of Versed 1.0 mg and Fentanyl 25 mcg was administered intravenously by the radiology nurse. Total intra-service moderate Sedation Time: 20 minutes. The patient's level of consciousness and vital signs were monitored continuously by radiology nursing throughout the procedure under my direct supervision. CONTRAST:  10 cc-administered into the collecting system(s) FLUOROSCOPY TIME:  Fluoroscopy Time: 1 minutes 6 seconds (22 mGy). COMPLICATIONS: None immediate. PROCEDURE: Informed written consent was obtained  from the patient after a thorough discussion of the procedural risks, benefits and alternatives. All questions were addressed. Maximal Sterile Barrier Technique was utilized including caps, mask, sterile gowns, sterile gloves, sterile drape, hand hygiene and skin antiseptic. A timeout was performed prior to the initiation of the procedure. Previous imaging reviewed. Patient positioned prone. Ultrasound localization performed to locate the hydronephrotic kidneys. Overlying skin marked. Under sterile condition and local anesthesia, ultrasound percutaneous needle access performed of mid to lower pole dilated calices bilaterally with 18 gauge 15 cm needles. There was return of urine bilaterally. Over Amplatz guidewires, tract dilatation performed to insert bilateral 10 French nephrostomies. Retention loop formed in the renal pelvis. Contrast injection confirms position of the nephrostomies. Catheter secured with Prolene sutures and connected to external gravity drainage bag. Sterile dressing applied. No  immediate complication. Patient tolerated the procedure well. IMPRESSION: Successful bilateral ultrasound and fluoroscopic 10 French nephrostomies Electronically Signed   By: Jerilynn Mages.  Shick M.D.   On: 04/21/2021 12:53   IR NEPHROSTOMY PLACEMENT RIGHT  Result Date: 04/21/2021 INDICATION: Renal failure, bilateral obstructive hydronephrosis EXAM: ULTRASOUND FLUOROSCOPIC BILATERAL 10 FRENCH NEPHROSTOMIES COMPARISON:  04/18/2021 MEDICATIONS: 1% LIDOCAINE LOCAL ANESTHESIA/SEDATION: Moderate (conscious) sedation was employed during this procedure. A total of Versed 1.0 mg and Fentanyl 25 mcg was administered intravenously by the radiology nurse. Total intra-service moderate Sedation Time: 20 minutes. The patient's level of consciousness and vital signs were monitored continuously by radiology nursing throughout the procedure under my direct supervision. CONTRAST:  10 cc-administered into the collecting system(s) FLUOROSCOPY TIME:  Fluoroscopy Time: 1 minutes 6 seconds (22 mGy). COMPLICATIONS: None immediate. PROCEDURE: Informed written consent was obtained from the patient after a thorough discussion of the procedural risks, benefits and alternatives. All questions were addressed. Maximal Sterile Barrier Technique was utilized including caps, mask, sterile gowns, sterile gloves, sterile drape, hand hygiene and skin antiseptic. A timeout was performed prior to the initiation of the procedure. Previous imaging reviewed. Patient positioned prone. Ultrasound localization performed to locate the hydronephrotic kidneys. Overlying skin marked. Under sterile condition and local anesthesia, ultrasound percutaneous needle access performed of mid to lower pole dilated calices bilaterally with 18 gauge 15 cm needles. There was return of urine bilaterally. Over Amplatz guidewires, tract dilatation performed to insert bilateral 10 French nephrostomies. Retention loop formed in the renal pelvis. Contrast injection confirms position of the  nephrostomies. Catheter secured with Prolene sutures and connected to external gravity drainage bag. Sterile dressing applied. No immediate complication. Patient tolerated the procedure well. IMPRESSION: Successful bilateral ultrasound and fluoroscopic 10 French nephrostomies Electronically Signed   By: Jerilynn Mages.  Shick M.D.   On: 04/21/2021 12:53      Subjective: Denies complaints.  Has been ambulating in the room without discomfort.  No dizziness, lightheadedness, shortness of breath or chest pain.  Ongoing minimal vaginal bleeding.  Discharge Exam:  Vitals:   04/28/21 1953 04/29/21 0331 04/29/21 0333 04/29/21 0740  BP: 111/76 117/75 117/75 127/70  Pulse: 74 86 86 79  Resp: 18  18 17   Temp: 98.3 F (36.8 C) 98.8 F (37.1 C) 98.8 F (37.1 C) 99 F (37.2 C)  TempSrc: Oral Oral Oral Oral  SpO2: 99% 100% 100% 98%  Weight:        General exam: Middle-age female, moderately built and obese lying comfortably propped up in bed without distress. Respiratory system: Clear to auscultation.  No increased work of breathing. Cardiovascular system: S1 and S2 heard, irregularly irregular, no JVD or murmurs or pedal edema.  Gastrointestinal  system: Abdomen is nondistended/protuberant, soft and nontender. No organomegaly or masses felt. Normal bowel sounds heard. Genitourinary: Bilateral PCNs in place and functioning well. Central nervous system: Alert and oriented. No focal neurological deficits. Extremities: Symmetric 5 x 5 power. Skin: No rashes, lesions or ulcers Psychiatry: Judgement and insight appear normal. Mood & affect appropriate.     The results of significant diagnostics from this hospitalization (including imaging, microbiology, ancillary and laboratory) are listed below for reference.     Microbiology: Recent Results (from the past 240 hour(s))  Surgical pcr screen     Status: Abnormal   Collection Time: 04/19/21 10:01 PM   Specimen: Nasal Mucosa; Nasal Swab  Result Value Ref  Range Status   MRSA, PCR POSITIVE (A) NEGATIVE Final    Comment: RESULT CALLED TO, READ BACK BY AND VERIFIED WITH: LOURDES,RN@[id number]  04/20/21 MK    Staphylococcus aureus POSITIVE (A) NEGATIVE Final    Comment: (NOTE) The Xpert SA Assay (FDA approved for NASAL specimens in patients 3 years of age and older), is one component of a comprehensive surveillance program. It is not intended to diagnose infection nor to guide or monitor treatment. Performed at [facility], [place] [address], [place], [place] [address]      Labs: CBC: Recent Labs  Lab 04/25/21 0039 04/26/21 0101 04/27/21 0208 04/28/21 0324 04/28/21 2128 04/29/21 0158  WBC 12.4* 11.4* 11.2* 10.2  --  10.3  HGB 7.8* 7.8* 7.5* 7.3* 8.4* 8.4*  HCT 25.5* 25.1* 24.9* 24.6* 26.6* 26.9*  MCV 95.5 96.2 97.3 98.4  --  94.4  PLT 463* 384 355 336  --  790    Basic Metabolic Panel: Recent Labs  Lab 04/26/21 0101 04/26/21 0934 04/26/21 1618 04/27/21 0208 04/28/21 0324 04/29/21 0158 04/29/21 0627  NA 142  --  141 141 142 140  --   K 2.9*  --  3.7 3.8 3.7 4.0  --   CL 109  --  111 114* 114* 115*  --   CO2 20*  --  19* 20* 18* 20*  --   GLUCOSE 93  --  124* 106* 99 96  --   BUN 27*  --  20 18 13 12   --   CREATININE 3.45*  --  3.02* 2.78* 2.44* 2.29*  --   CALCIUM 8.3*  --  8.3* 8.3* 8.5* 8.2*  --   MG  --  1.2* 2.1 1.8 1.5*  --  1.6*    Liver Function Tests: No results for input(s): AST, ALT, ALKPHOS, BILITOT, PROT, ALBUMIN in the last 168 hours.  CBG: Recent Labs  Lab 04/28/21 0753 04/28/21 1147 04/28/21 1652 04/28/21 2119 04/29/21 0737  GLUCAP 94 91 103* 114* 79     Urinalysis    Component Value Date/Time   COLORURINE YELLOW 04/18/2021 2120   APPEARANCEUR CLOUDY (A) 04/18/2021 2120   LABSPEC 1.009 04/18/2021 2120   PHURINE 6.0 04/18/2021 2120   GLUCOSEU NEGATIVE 04/18/2021 2120   HGBUR SMALL (A) 04/18/2021 2120   [place] NEGATIVE 04/18/2021 2120   [place] NEGATIVE 04/18/2021 2120    PROTEINUR 100 (A) 04/18/2021 2120   NITRITE NEGATIVE 04/18/2021 2120   LEUKOCYTESUR LARGE (A) 04/18/2021 2120    Discussed in detail with patient's daughter Claiborne Billings via phone, updated care and answered all questions.  Time coordinating discharge: 45 minutes  SIGNED:  Vernell Leep, MD,  FACP, [facility], [facility], [facility] (Care Management Physician Certified). [facility]  To contact the attending provider between 7A-7P or  the covering provider during after hours 7P-7A, please log into the web site [email or web address] and access using universal [place] password for that web site. If you do not have the password, please call the hospital operator.

## 2021-04-29 NOTE — Progress Notes (Signed)
Discussed with the patient's RN, [id number], and the patient herself:  The patient is going home today and has an anticoagulation visit for an INR check on 1/17. The patient has 5 mg tablets at home, verified by the patient's fill history, anticoagulation visit notes, and discussing with the patient.   Warfarin Dose Recommendations (the patient can resume their PTA regimen): 1/15: 10 mg (two 5 mg tablets) - the patient will get prior to discharge 1/16: 15 mg (three 5 mg tablets) - the patient will take at home 1/17: anticoagulation clinic visit - clinic to get labs and provide further dose recommendations

## 2021-04-29 NOTE — TOC Transition Note (Signed)
Transition of Care [facility]) - CM/SW Discharge Note   Patient Details  Name: Debbie [name] MRN: [id number] Date of Birth: 1960-10-10  Transition of Care [facility]) CM/SW Contact:  Bartholomew Crews, RN Phone Number: [phone] 04/29/2021, 3:09 PM   Clinical Narrative:     Unable to secure home health for teaching of drains. Nursing advised to ensure that patient/family trained on how to care for drain.   Additional attemps at [place] does not have contract with [employer] for nursing, only PT and does not cover [facility]. Unable to reach anyone at [facility] at this time.     Barriers to Discharge: Continued Medical Work up   Patient Goals and CMS Choice        Discharge Placement                       Discharge Plan and Services                                     Social Determinants of Health (SDOH) Interventions     Readmission Risk Interventions No flowsheet data found.

## 2021-04-29 NOTE — Progress Notes (Addendum)
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  No Known Allergies  Patient Measurements: Weight: (!) 136.5 kg (301 lb) Ideal Body Weight: 50 kg Heparin Dosing Weight: 84.7 kg  Vital Signs: Temp: 98.8 F (37.1 C) (01/15 0333) Temp Source: Oral (01/15 0333) BP: 117/75 (01/15 0333) Pulse Rate: 86 (01/15 0333)  Labs: Recent Labs    04/27/21 0208 04/28/21 0324 04/28/21 2128 04/29/21 0158  HGB 7.5* 7.3* 8.4* 8.4*  HCT 24.9* 24.6* 26.6* 26.9*  PLT 355 336  --  294  LABPROT 17.6* 20.2*  --  23.5*  INR 1.5* 1.7*  --  2.1*  HEPARINUNFRC 0.46 0.47  --  >1.10*  CREATININE 2.78* 2.44*  --  2.29*    Estimated Creatinine Clearance: 34.1 mL/min (A) (by C-G formula based on SCr of 2.29 mg/dL (H)).  Assessment: 61 yo female on warfarin prior to admission was admitted with INR of 8.5. The patient is now s/p several doses of Vitamin K, INR down to 1.3. On 1/7, the patient had bilateral nephrostomy tubes placed. MD consulted pharmacy to dose IV heparin due to high clot risk with history of Afib, DVT/PE and malignancy. Warfarin resumed on 1/10.  PTA warfarin dose: 10 mg daily except 15 mg MWF  INR today is therapeutic at 2.1. The heparin level resulted supratherapeutic at >1.10 while running at 1600 units/hr. It is unclear if the heparin level was drawn from the correct line. However, I will not order a STAT re-draw because the patient's INR is therapeutic and the heparin infusion will be discontinued.  No new interacting medications were started, and dietary intake remains stable. Hgb low but slightly improved at 8.4, platelets 294. No signs or symptoms of bleeding have been documented.   Goal of Therapy:  INR 2-3 Heparin level 0.3-0.7 units/ml Monitor platelets by anticoagulation protocol: Yes   Plan:  Discontinue heparin IV Give warfarin PO 10 mg x 1 dose tonight  Obtain a daily PT/INR and  CBC Monitor for signs and symptoms of bleeding Watch for drug interactions and dietary changes  Thank you for involving pharmacy in this patient's care.  Shauna Hugh, PharmD, [place]  PGY-2 Pharmacy Resident 04/29/2021 7:10 AM  Please check [email or web address] for unit-specific pharmacy phone numbers.  ----------------------------------------------------- Addendum: The patient will discharge today and has an anticoagulation visit for warfarin on 1/17. Pharmacy is consulted to provide dosing recommendations to get the patient to their anticoagulation visit. The patient has 5 mg tablets at home, verified by the patient's fill history, anticoagulation visit notes, and discussing with the patient.  Warfarin Dose Recommendations (the patient can resume their PTA regimen): 1/15: 10 mg (two 5 mg tablets) 1/16: 15 mg (three 5 mg tablets) 1/17: anticoagulation clinic visit - clinic to get labs and provide further dose recommendations  Shauna Hugh, PharmD, [place]  PGY-2 Pharmacy Resident 04/29/2021 9:28 AM  Please check [email or web address] for unit-specific pharmacy phone numbers.

## 2021-04-29 NOTE — Discharge Instructions (Addendum)
Reminder: You have an anticoagulation visit for your warfarin (Coumadin) at [facility] in [place] on 1/17 at 8:35 AM (please arrive at 8:30 AM). Until then, make sure you continue to take warfarin according to the instructions below.   Warfarin (Coumadin) Dose: 1/15: You already received 10 mg (two 5 mg tablets) by mouth in the hospital - do NOT take any warfarin at home 1/16: Take warfarin 15 mg (three 5 mg tablets) by mouth 1/17: anticoagulation clinic visit - clinic to get labs and provide further dose recommendations  Information on my medicine - Coumadin   (Warfarin)  This medication education was reviewed with me or my healthcare representative as part of my discharge preparation.  Why was Coumadin prescribed for you? Coumadin was prescribed for you because you have a blood clot or a medical condition that can cause an increased risk of forming blood clots. Blood clots can cause serious health problems by blocking the flow of blood to the heart, lung, or brain. Coumadin can prevent harmful blood clots from forming. As a reminder your indication for Coumadin is:  Stroke Prevention because of Atrial Fibrillation and History of Clots  What test will check on my response to Coumadin? While on Coumadin (warfarin) you will need to have an INR test regularly to ensure that your dose is keeping you in the desired range. The INR (international normalized ratio) number is calculated from the result of the laboratory test called prothrombin time (PT).  If an INR APPOINTMENT HAS NOT ALREADY BEEN MADE FOR YOU please schedule an appointment to have this lab work done by your health care provider within 7 days. Your INR goal is usually a number between:  2 to 3 or your provider may give you a more narrow range like 2-2.5.  Ask your health care provider during an office visit what your goal INR is.  What  do you need to  know  About  COUMADIN? Take Coumadin (warfarin) exactly as prescribed by your  healthcare provider about the same time each day.  DO NOT stop taking without talking to the doctor who prescribed the medication.  Stopping without other blood clot prevention medication to take the place of Coumadin may increase your risk of developing a new clot or stroke.  Get refills before you run out.  What do you do if you miss a dose? If you miss a dose, take it as soon as you remember on the same day then continue your regularly scheduled regimen the next day.  Do not take two doses of Coumadin at the same time.  Important Safety Information A possible side effect of Coumadin (Warfarin) is an increased risk of bleeding. You should call your healthcare provider right away if you experience any of the following: Bleeding from an injury or your nose that does not stop. Unusual colored urine (red or dark brown) or unusual colored stools (red or black). Unusual bruising for unknown reasons. A serious fall or if you hit your head (even if there is no bleeding).  Some foods or medicines interact with Coumadin (warfarin) and might alter your response to warfarin. To help avoid this: Eat a balanced diet, maintaining a consistent amount of Vitamin K. Notify your provider about major diet changes you plan to make. Avoid alcohol or limit your intake to 1 drink for women and 2 drinks for men per day. (1 drink is 5 oz. wine, 12 oz. beer, or 1.5 oz. liquor.)  Make sure that ANY health care  provider who prescribes medication for you knows that you are taking Coumadin (warfarin).  Also make sure the healthcare provider who is monitoring your Coumadin knows when you have started a new medication including herbals and non-prescription products.  Coumadin (Warfarin)  Major Drug Interactions  Increased Warfarin Effect Decreased Warfarin Effect  Alcohol (large quantities) Antibiotics (esp. Septra/Bactrim, Flagyl, Cipro) Amiodarone (Cordarone) Aspirin (ASA) Cimetidine (Tagamet) Megestrol  (Megace) NSAIDs (ibuprofen, naproxen, etc.) Piroxicam (Feldene) Propafenone (Rythmol SR) Propranolol (Inderal) Isoniazid (INH) Posaconazole (Noxafil) Barbiturates (Phenobarbital) Carbamazepine (Tegretol) Chlordiazepoxide (Librium) Cholestyramine (Questran) Griseofulvin Oral Contraceptives Rifampin Sucralfate (Carafate) Vitamin K   Coumadin (Warfarin) Major Herbal Interactions  Increased Warfarin Effect Decreased Warfarin Effect  Garlic Ginseng Ginkgo biloba Coenzyme Q10 Green tea St. Johns wort    Coumadin (Warfarin) FOOD Interactions  Eat a consistent number of servings per week of foods HIGH in Vitamin K (1 serving =  cup)  Collards (cooked, or boiled & drained) Kale (cooked, or boiled & drained) Mustard greens (cooked, or boiled & drained) Parsley *serving size only =  cup Spinach (cooked, or boiled & drained) Swiss chard (cooked, or boiled & drained) Turnip greens (cooked, or boiled & drained)  Eat a consistent number of servings per week of foods MEDIUM-HIGH in Vitamin K (1 serving = 1 cup)  Asparagus (cooked, or boiled & drained) Broccoli (cooked, boiled & drained, or raw & chopped) Brussel sprouts (cooked, or boiled & drained) *serving size only =  cup Lettuce, raw (green leaf, endive, romaine) Spinach, raw Turnip greens, raw & chopped   These websites have more information on Coumadin (warfarin):  FailFactory.se; VeganReport.com.au;   Additional discharge instructions  Please get your medications reviewed and adjusted by your Primary MD.  Please request your Primary MD to go over all Hospital Tests and Procedure/Radiological results at the follow up, please get all Hospital records sent to your Prim MD by signing hospital release before you go home.  If you had Pneumonia of Lung problems at the Hospital: Please get a 2 view Chest X ray done in 6-8 weeks after hospital discharge or sooner if instructed by your Primary MD.  If you  have Congestive Heart Failure: Please call your Cardiologist or Primary MD anytime you have any of the following symptoms:  1) 3 pound weight gain in 24 hours or 5 pounds in 1 week  2) shortness of breath, with or without a dry hacking cough  3) swelling in the hands, feet or stomach  4) if you have to sleep on extra pillows at night in order to breathe  Follow cardiac low salt diet and 1.5 lit/day fluid restriction.  If you have diabetes Accuchecks 4 times/day, Once in AM empty stomach and then before each meal. Log in all results and show them to your primary doctor at your next visit. If any glucose reading is under 80 or above 300 call your primary MD immediately.  If you have Seizure/Convulsions/Epilepsy: Please do not drive, operate heavy machinery, participate in activities at heights or participate in high speed sports until you have seen by Primary MD or a Neurologist and advised to do so again.  If you had Gastrointestinal Bleeding: Please ask your Primary MD to check a complete blood count within one week of discharge or at your next visit. Your endoscopic/colonoscopic biopsies that are pending at the time of discharge, will also need to followed by your Primary MD.  Get Medicines reviewed and adjusted. Please take all your medications with you for your next  visit with your Primary MD **PLEASE ASK YOUR PROVIDER IF YOU SHOULD CONTINUE OR STOP TAKING MEGASTROL (MEGACE)**  Please request your Primary MD to go over all hospital tests and procedure/radiological results at the follow up, please ask your Primary MD to get all Hospital records sent to his/her office.  If you experience worsening of your admission symptoms, develop shortness of breath, life threatening emergency, suicidal or homicidal thoughts you must seek medical attention immediately by calling 911 or calling your MD immediately  if symptoms less severe.  You must read complete instructions/literature along with all  the possible adverse reactions/side effects for all the Medicines you take and that have been prescribed to you. Take any new Medicines after you have completely understood and accpet all the possible adverse reactions/side effects.   Do not drive or operate heavy machinery when taking Pain medications.   Do not take more than prescribed Pain, Sleep and Anxiety Medications  Special Instructions: If you have smoked or chewed Tobacco  in the last 2 yrs please stop smoking, stop any regular Alcohol  and or any Recreational drug use.  Wear Seat belts while driving.  Please note You were cared for by a hospitalist during your hospital stay. If you have any questions about your discharge medications or the care you received while you were in the hospital after you are discharged, you can call the unit and asked to speak with the hospitalist on call if the hospitalist that took care of you is not available. Once you are discharged, your primary care physician will handle any further medical issues. Please note that NO REFILLS for any discharge medications will be authorized once you are discharged, as it is imperative that you return to your primary care physician (or establish a relationship with a primary care physician if you do not have one) for your aftercare needs so that they can reassess your need for medications and monitor your lab values.  You can reach the hospitalist office at phone [phone] or fax [phone]   If you do not have a primary care physician, you can call [phone] for a physician referral.   Nephrostomy [place] your hands before you handle the nephrostomy tube. -Clean the area around the tube gently with soap and water every day. -Keep the drainage bag lower than your kidney to keep urine from backing up. -Empty the drainage bag before it is completely full  -Do not swim or take baths while you have a nephrostomy tube. You can shower after wrapping the end of  the nephrostomy tube with plastic wrap. -If you require a dressing around the nephrostomy tube, it should be Changed every 3 days or when it gets wet or dirty.   See attached handouts for additional instructions

## 2021-04-29 NOTE — Progress Notes (Signed)
°  Request seen for instructions for PCN.  I have attached the following clinical references which should print at discharge =    Urology should enter orders for routine exchanges.  Floretta Petro S Drishti Pepperman PA-C 04/29/2021 9:09 AM

## 2021-04-29 NOTE — TOC Progression Note (Signed)
Transition of Care [facility]) - Progression Note    Patient Details  Name: Debbie [name] MRN: [id number] Date of Birth: 03/28/1961  Transition of Care [facility]) CM/SW [place], RN Phone Number: 04/29/2021, 1:16 PM  Clinical Narrative:     Called to [facility] RN. Called [facility],- all refused due to no contract with [facility]. [facility] could not accept, limited Nursing.    Barriers to Discharge: Continued Medical Work up  Expected Discharge Plan and Services           Expected Discharge Date: 04/29/21                                     Social Determinants of Health (SDOH) Interventions    Readmission Risk Interventions No flowsheet data found.

## 2021-05-01 ENCOUNTER — Ambulatory Visit (INDEPENDENT_AMBULATORY_CARE_PROVIDER_SITE_OTHER): Payer: Medicaid Other | Admitting: *Deleted

## 2021-05-01 ENCOUNTER — Encounter: Payer: Self-pay | Admitting: Obstetrics & Gynecology

## 2021-05-01 ENCOUNTER — Other Ambulatory Visit (HOSPITAL_COMMUNITY)
Admission: RE | Admit: 2021-05-01 | Discharge: 2021-05-01 | Disposition: A | Discharge status: Home / Self Care | Payer: Medicaid Other | Source: Ambulatory Visit | Attending: Obstetrics & Gynecology | Admitting: Obstetrics & Gynecology

## 2021-05-01 ENCOUNTER — Other Ambulatory Visit: Payer: Self-pay

## 2021-05-01 ENCOUNTER — Ambulatory Visit: Payer: Medicaid Other | Admitting: Obstetrics & Gynecology

## 2021-05-01 VITALS — BP 125/88 | HR 79 | Ht 62.0 in | Wt 300.0 lb

## 2021-05-01 DIAGNOSIS — I4891 Unspecified atrial fibrillation: Secondary | ICD-10-CM

## 2021-05-01 DIAGNOSIS — C539 Malignant neoplasm of cervix uteri, unspecified: Secondary | ICD-10-CM | POA: Diagnosis not present

## 2021-05-01 DIAGNOSIS — Z5181 Encounter for therapeutic drug level monitoring: Secondary | ICD-10-CM | POA: Diagnosis not present

## 2021-05-01 DIAGNOSIS — I2699 Other pulmonary embolism without acute cor pulmonale: Secondary | ICD-10-CM | POA: Diagnosis not present

## 2021-05-01 LAB — POCT INR: INR: 2.3 (ref 2.0–3.0)

## 2021-05-01 NOTE — Patient Instructions (Signed)
Decrease warfarin to 2 tablets daily except 3 tablets on Sundays Recheck in 1 wk Has appt with Dr Elonda Husky today and Dr Alyson Ingles in 2 wks
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
## 2021-05-02 ENCOUNTER — Telehealth: Payer: Self-pay | Admitting: Cardiology

## 2021-05-02 NOTE — Telephone Encounter (Signed)
Called patient to inform her her I had not called but checked to see if she had any questions or concerns.  She did not.  Told her it could have been a reminder call about her appt with me on Monday.  She was appreciative of call.

## 2021-05-02 NOTE — Telephone Encounter (Signed)
Patient states she is returning a call to Edrick Oh, Therapist, sports. However, Lattie Haw states she did not call. Did anyone contact this patient? Please advise.

## 2021-05-04 ENCOUNTER — Other Ambulatory Visit: Payer: Self-pay

## 2021-05-04 ENCOUNTER — Encounter: Payer: Self-pay | Admitting: Urology

## 2021-05-04 ENCOUNTER — Ambulatory Visit: Payer: Medicaid Other | Admitting: Urology

## 2021-05-04 VITALS — BP 100/68 | HR 99

## 2021-05-04 DIAGNOSIS — N1339 Other hydronephrosis: Secondary | ICD-10-CM

## 2021-05-04 LAB — URINALYSIS, ROUTINE W REFLEX MICROSCOPIC
Bilirubin, UA: NEGATIVE
Glucose, UA: NEGATIVE
Ketones, UA: NEGATIVE
Nitrite, UA: NEGATIVE
Specific Gravity, UA: 1.015 (ref 1.005–1.030)
Urobilinogen, Ur: 0.2 mg/dL (ref 0.2–1.0)
pH, UA: 6 (ref 5.0–7.5)

## 2021-05-04 LAB — MICROSCOPIC EXAMINATION
Epithelial Cells (non renal): NONE SEEN /hpf (ref 0–10)
Renal Epithel, UA: NONE SEEN /hpf

## 2021-05-04 LAB — SURGICAL PATHOLOGY

## 2021-05-04 NOTE — Progress Notes (Signed)
Urological Symptom Review  Patient is experiencing the following symptoms: Frequent urination Stream starts and stops Injury to kidneys/bladder Vaginal bleeding (female only)   Review of Systems  Gastrointestinal (upper)  : Negative for upper GI symptoms  Gastrointestinal (lower) : Diarrhea  Constitutional : Negative for symptoms  Skin: Negative for skin symptoms  Eyes: Negative for eye symptoms  Ear/Nose/Throat : Negative for Ear/Nose/Throat symptoms  Hematologic/Lymphatic: Swollen glands  Cardiovascular : Leg swelling  Respiratory : Shortness of breath  Endocrine: Excessive thirst  Musculoskeletal: Negative for musculoskeletal symptoms  Neurological: Negative for neurological symptoms  Psychologic: Negative for psychiatric symptoms
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
## 2021-05-04 NOTE — Addendum Note (Signed)
Addended by: Gibson Ramp K on: 05/04/2021 02:00 PM   Modules accepted: Orders

## 2021-05-04 NOTE — Patient Instructions (Signed)
Hydronephrosis ?Hydronephrosis is the swelling of one or both kidneys due to a blockage that stops urine from flowing out of the body. Kidneys filter waste from the blood and produce urine. This condition can lead to kidney failure and may become life-threatening if not treated promptly. ?What are the causes? ?In infants and children, common causes include problems that occur when a baby is developing in the womb. These can include problems in the kidneys or in the tubes that drain urine into the bladder (ureters). ?In adults, common causes include: ?Kidney stones. ?Pregnancy. ?A tumor or cyst in the abdomen or pelvis. ?An enlarged prostate gland. ?Other causes include: ?Bladder infection. ?Scar tissue from a previous surgery or injury. ?A blood clot. ?Cancer of the prostate, bladder, uterus, ovary, or colon. ?What are the signs or symptoms? ?Symptoms of this condition include: ?Pain or discomfort in your side (flank) or abdomen. ?Swelling in your abdomen. ?Nausea and vomiting. ?Fever. ?Pain when passing urine. ?Feelings of urgency when you need to urinate. ?Urinating more often than normal. ?In some cases, you may not have any symptoms. ?How is this diagnosed? ?This condition may be diagnosed based on: ?Your symptoms and medical history. ?A physical exam. ?Blood and urine tests. ?Imaging tests, such as an ultrasound, CT scan, or MRI. ?A procedure to look at your urinary tract and bladder by inserting a scope into the urethra (cystoscopy). ?How is this treated? ?Treatment for this condition depends on where the blockage is, how long it has been there, and what caused it. The goal of treatment is to remove the blockage. Treatment may include: ?Antibiotic medicines to treat or prevent infection. ?A procedure to place a small, thin tube (stent) into a blocked ureter. The stent will keep the ureter open so that urine can drain through it. ?A nonsurgical procedure that crushes kidney stones with shock waves  (extracorporeal shock wave lithotripsy). ?If kidney failure occurs, treatment may include dialysis or a kidney transplant. ?Follow these instructions at home: ? ?Take over-the-counter and prescription medicines only as told by your health care provider. ?If you were prescribed an antibiotic medicine, take it exactly as told by your health care provider. Do not stop taking the antibiotic even if you start to feel better. ?Rest and return to your normal activities as told by your health care provider. Ask your health care provider what activities are safe for you. ?Drink enough fluid to keep your urine pale yellow. ?Keep all follow-up visits. This is important. ?Contact a health care provider if: ?You continue to have symptoms after treatment. ?You develop new symptoms. ?Your urine becomes cloudy or bloody. ?You have a fever. ?Get help right away if: ?You have severe flank or abdominal pain. ?You cannot drink fluids without vomiting. ?Summary ?Hydronephrosis is the swelling of one or both kidneys due to a blockage that stops urine from flowing out of the body. ?Hydronephrosis can lead to kidney failure and may become life-threatening if not treated promptly. ?The goal of treatment is to remove the blockage. It may include a procedure to insert a stent into a blocked ureter, a procedure to break up kidney stones, or taking antibiotic medicines. ?Follow your health care provider's instructions for taking care of yourself at home, including instructions about drinking fluids, taking medicines, and limiting activities. ?This information is not intended to replace advice given to you by your health care provider. Make sure you discuss any questions you have with your health care provider. ?Document Revised: 07/20/2019 Document Reviewed: 07/20/2019 ?Elsevier Patient   Education ? 2022 [employer] Inc. ? ?

## 2021-05-07 ENCOUNTER — Other Ambulatory Visit: Payer: Self-pay

## 2021-05-07 ENCOUNTER — Ambulatory Visit (INDEPENDENT_AMBULATORY_CARE_PROVIDER_SITE_OTHER): Payer: Medicaid Other | Admitting: *Deleted

## 2021-05-07 DIAGNOSIS — I4891 Unspecified atrial fibrillation: Secondary | ICD-10-CM

## 2021-05-07 DIAGNOSIS — Z5181 Encounter for therapeutic drug level monitoring: Secondary | ICD-10-CM

## 2021-05-07 DIAGNOSIS — I2699 Other pulmonary embolism without acute cor pulmonale: Secondary | ICD-10-CM | POA: Diagnosis not present

## 2021-05-07 LAB — POCT INR: INR: 2.6 (ref 2.0–3.0)

## 2021-05-07 NOTE — Patient Instructions (Signed)
Continue warfarin 2 tablets daily except 3 tablets on Sundays Recheck in 2 wks

## 2021-05-09 ENCOUNTER — Encounter: Payer: Self-pay | Admitting: Gynecologic Oncology

## 2021-05-09 ENCOUNTER — Telehealth: Payer: Self-pay | Admitting: *Deleted

## 2021-05-09 NOTE — Telephone Encounter (Signed)
Spoke with the patient and scheduled a new patient appt with Dr Berline Lopes for tomorrow at 9 am. Patient given the address and phone number for the clinic

## 2021-05-10 ENCOUNTER — Other Ambulatory Visit: Payer: Self-pay

## 2021-05-10 ENCOUNTER — Encounter: Payer: Self-pay | Admitting: Gynecologic Oncology

## 2021-05-10 ENCOUNTER — Encounter: Payer: Self-pay | Admitting: Oncology

## 2021-05-10 ENCOUNTER — Inpatient Hospital Stay: Payer: Medicaid Other

## 2021-05-10 ENCOUNTER — Inpatient Hospital Stay: Payer: Medicaid Other | Attending: Gynecologic Oncology | Admitting: Gynecologic Oncology

## 2021-05-10 VITALS — BP 116/59 | HR 82 | Temp 98.5°F | Resp 18 | Wt 286.4 lb

## 2021-05-10 DIAGNOSIS — Z86711 Personal history of pulmonary embolism: Secondary | ICD-10-CM | POA: Insufficient documentation

## 2021-05-10 DIAGNOSIS — D500 Iron deficiency anemia secondary to blood loss (chronic): Secondary | ICD-10-CM

## 2021-05-10 DIAGNOSIS — N1330 Unspecified hydronephrosis: Secondary | ICD-10-CM

## 2021-05-10 DIAGNOSIS — C539 Malignant neoplasm of cervix uteri, unspecified: Secondary | ICD-10-CM

## 2021-05-10 DIAGNOSIS — Z86718 Personal history of other venous thrombosis and embolism: Secondary | ICD-10-CM | POA: Insufficient documentation

## 2021-05-10 DIAGNOSIS — Z7901 Long term (current) use of anticoagulants: Secondary | ICD-10-CM | POA: Insufficient documentation

## 2021-05-10 DIAGNOSIS — Z79899 Other long term (current) drug therapy: Secondary | ICD-10-CM | POA: Insufficient documentation

## 2021-05-10 DIAGNOSIS — Z7984 Long term (current) use of oral hypoglycemic drugs: Secondary | ICD-10-CM | POA: Diagnosis not present

## 2021-05-10 DIAGNOSIS — I4891 Unspecified atrial fibrillation: Secondary | ICD-10-CM

## 2021-05-10 DIAGNOSIS — R5383 Other fatigue: Secondary | ICD-10-CM | POA: Insufficient documentation

## 2021-05-10 DIAGNOSIS — R634 Abnormal weight loss: Secondary | ICD-10-CM | POA: Insufficient documentation

## 2021-05-10 DIAGNOSIS — Z6841 Body Mass Index (BMI) 40.0 and over, adult: Secondary | ICD-10-CM

## 2021-05-10 LAB — CBC WITH DIFFERENTIAL (CANCER CENTER ONLY)
Abs Immature Granulocytes: 0.17 10*3/uL — ABNORMAL HIGH (ref 0.00–0.07)
Basophils Absolute: 0.1 10*3/uL (ref 0.0–0.1)
Basophils Relative: 1 %
Eosinophils Absolute: 0.2 10*3/uL (ref 0.0–0.5)
Eosinophils Relative: 2 %
HCT: 31.8 % — ABNORMAL LOW (ref 36.0–46.0)
Hemoglobin: 10 g/dL — ABNORMAL LOW (ref 12.0–15.0)
Immature Granulocytes: 1 %
Lymphocytes Relative: 19 %
Lymphs Abs: 2.5 10*3/uL (ref 0.7–4.0)
MCH: 28.1 pg (ref 26.0–34.0)
MCHC: 31.4 g/dL (ref 30.0–36.0)
MCV: 89.3 fL (ref 80.0–100.0)
Monocytes Absolute: 1.6 10*3/uL — ABNORMAL HIGH (ref 0.1–1.0)
Monocytes Relative: 12 %
Neutro Abs: 8.6 10*3/uL — ABNORMAL HIGH (ref 1.7–7.7)
Neutrophils Relative %: 65 %
Platelet Count: 472 10*3/uL — ABNORMAL HIGH (ref 150–400)
RBC: 3.56 MIL/uL — ABNORMAL LOW (ref 3.87–5.11)
RDW: 14.4 % (ref 11.5–15.5)
WBC Count: 13.1 10*3/uL — ABNORMAL HIGH (ref 4.0–10.5)
nRBC: 0 % (ref 0.0–0.2)

## 2021-05-10 LAB — CMP (CANCER CENTER ONLY)
ALT: 30 U/L (ref 0–44)
AST: 44 U/L — ABNORMAL HIGH (ref 15–41)
Albumin: 3.4 g/dL — ABNORMAL LOW (ref 3.5–5.0)
Alkaline Phosphatase: 95 U/L (ref 38–126)
Anion gap: 10 (ref 5–15)
BUN: 24 mg/dL — ABNORMAL HIGH (ref 8–23)
CO2: 22 mmol/L (ref 22–32)
Calcium: 9.1 mg/dL (ref 8.9–10.3)
Chloride: 102 mmol/L (ref 98–111)
Creatinine: 2.24 mg/dL — ABNORMAL HIGH (ref 0.44–1.00)
GFR, Estimated: 24 mL/min — ABNORMAL LOW (ref >60)
Glucose, Bld: 115 mg/dL — ABNORMAL HIGH (ref 70–99)
Potassium: 3.7 mmol/L (ref 3.5–5.1)
Sodium: 134 mmol/L — ABNORMAL LOW (ref 135–145)
Total Bilirubin: 0.5 mg/dL (ref 0.3–1.2)
Total Protein: 7.9 g/dL (ref 6.5–8.1)

## 2021-05-10 NOTE — Progress Notes (Signed)
Met with Debbie [name] after her appointment with Dr. Berline Lopes. Went over upcoming appointments and instructions. Gave her the [place] [facility] guide and encouraged her to call with any questions or needs.

## 2021-05-10 NOTE — Patient Instructions (Addendum)
It was a pleasure meeting you today.  Today we discussed your work-up thus far, which shows a locally advanced cancer of the cervix.  This cancer appears to have invaded into the bladder and spread to some of the lymph nodes in your pelvis and abdomen.  I have ordered a PET scan to better evaluate these findings since you did not receive contrast at the time of your CT scan due to your kidney failure.  It will also help [place] evaluate for any distant cancer spread, such as cancer spread to your lungs.  As long as this scan confirms no distant cancer spread, then we will proceed with getting you set up for treatment, which will include radiation and chemotherapy.  Your kidney function had improved significantly after the tubes were put into your kidneys and your back.  We will check those labs today again and hopefully her kidney function has recovered a little bit more.  Your kidney function may impact your ability to receive chemotherapy with your radiation.  Our nurse navigator, Santiago Glad, is working to get you scheduled to see our radiation doctor as well as her medical oncologist.  Please not hesitate to call if you have any questions or need anything.  Our clinic number here is [phone].

## 2021-05-10 NOTE — Progress Notes (Signed)
Requested PD-L1 on accession [id number] with [facility] via email.
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
## 2021-05-10 NOTE — Progress Notes (Signed)
GYN Location of Tumor / Histology: Cervical  Debbie [name] presented with symptoms of: vaginal bleeding that started in November.  She describes this as first being similar to a menses, then stopping almost completely, and then restarting like menstrual bleeding.  She notes passage of clots that were oval in shape and about the size of a tennis ball. bilateral hydronephrosis related to GU malignancy. She underwent cervical biopsy which revealed poorly differentiated carcinoma.  Biopsies revealed:  A. CERVIX, BIOPSY:  Poorly differentiated carcinoma consistent with basaloid squamous cell  carcinoma.  No lymphovascular invasion is identified in the current specimen.   Past/Anticipated interventions by urology, if any:  Procedure: 1 cystoscopy 2.  Bladder biopsy with fulgeration Attending: Nicolette Bang  Past/Anticipated interventions by Gyn/Onc surgeon, if any: Dr Berline Lopes definitive radiation with sensitizing cisplatin (although kidney function may limit her ability to receive chemotherapy).  Weight changes, if any: yes, 6 pound weight loss in one week  Bowel/Bladder complaints, if any: Yes.  ,  diarrhea, has nephrostomy tubes, does not urinate.  Nausea/Vomiting, if any: no  Pain issues, if any:  no  SAFETY ISSUES: Prior radiation? no Pacemaker/ICD? no Possible current pregnancy? no, postmenopausal Is the patient on methotrexate? no  Current Complaints / other details:  none  Vitals:   05/15/21 0908  BP: (!) 86/69  Pulse: 96  Resp: 20  Temp: 97.9 F (36.6 C)  SpO2: 100%  Weight: 280 lb (127 kg)  Height: 5\' 2"  (1.575 m)

## 2021-05-11 ENCOUNTER — Telehealth: Payer: Self-pay

## 2021-05-11 NOTE — Telephone Encounter (Signed)
Spoke with patient regarding recent blood work. Dr. Berline Lopes said that her blood levels has improved and her kidney function is stable.  Patient verbalized understanding.
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
## 2021-05-15 ENCOUNTER — Ambulatory Visit
Admission: RE | Admit: 2021-05-15 | Discharge: 2021-05-15 | Disposition: A | Discharge status: Home / Self Care | Payer: Medicaid Other | Source: Ambulatory Visit | Attending: Radiation Oncology | Admitting: Radiation Oncology

## 2021-05-15 ENCOUNTER — Encounter: Payer: Self-pay | Admitting: Radiation Oncology

## 2021-05-15 ENCOUNTER — Other Ambulatory Visit: Payer: Self-pay

## 2021-05-15 ENCOUNTER — Encounter (HOSPITAL_COMMUNITY): Payer: Medicaid Other

## 2021-05-15 VITALS — BP 86/69 | HR 96 | Temp 97.9°F | Resp 20 | Ht 62.0 in | Wt 280.0 lb

## 2021-05-15 DIAGNOSIS — Z86711 Personal history of pulmonary embolism: Secondary | ICD-10-CM | POA: Insufficient documentation

## 2021-05-15 DIAGNOSIS — I10 Essential (primary) hypertension: Secondary | ICD-10-CM | POA: Diagnosis not present

## 2021-05-15 DIAGNOSIS — R5383 Other fatigue: Secondary | ICD-10-CM | POA: Insufficient documentation

## 2021-05-15 DIAGNOSIS — Z86718 Personal history of other venous thrombosis and embolism: Secondary | ICD-10-CM | POA: Insufficient documentation

## 2021-05-15 DIAGNOSIS — F409 Phobic anxiety disorder, unspecified: Secondary | ICD-10-CM | POA: Insufficient documentation

## 2021-05-15 DIAGNOSIS — K5900 Constipation, unspecified: Secondary | ICD-10-CM | POA: Diagnosis not present

## 2021-05-15 DIAGNOSIS — Z7984 Long term (current) use of oral hypoglycemic drugs: Secondary | ICD-10-CM | POA: Diagnosis not present

## 2021-05-15 DIAGNOSIS — R590 Localized enlarged lymph nodes: Secondary | ICD-10-CM | POA: Insufficient documentation

## 2021-05-15 DIAGNOSIS — Z79899 Other long term (current) drug therapy: Secondary | ICD-10-CM | POA: Insufficient documentation

## 2021-05-15 DIAGNOSIS — R197 Diarrhea, unspecified: Secondary | ICD-10-CM | POA: Diagnosis not present

## 2021-05-15 DIAGNOSIS — R0602 Shortness of breath: Secondary | ICD-10-CM | POA: Diagnosis not present

## 2021-05-15 DIAGNOSIS — N1330 Unspecified hydronephrosis: Secondary | ICD-10-CM | POA: Insufficient documentation

## 2021-05-15 DIAGNOSIS — Z7901 Long term (current) use of anticoagulants: Secondary | ICD-10-CM | POA: Insufficient documentation

## 2021-05-15 DIAGNOSIS — C539 Malignant neoplasm of cervix uteri, unspecified: Secondary | ICD-10-CM

## 2021-05-15 NOTE — Progress Notes (Signed)
See MD note for nursing evaluation. °

## 2021-05-17 ENCOUNTER — Inpatient Hospital Stay: Payer: Medicaid Other | Attending: Gynecologic Oncology | Admitting: Hematology and Oncology

## 2021-05-17 ENCOUNTER — Inpatient Hospital Stay: Payer: Medicaid Other

## 2021-05-17 ENCOUNTER — Encounter: Payer: Self-pay | Admitting: Hematology and Oncology

## 2021-05-17 ENCOUNTER — Telehealth: Payer: Self-pay | Admitting: Oncology

## 2021-05-17 ENCOUNTER — Other Ambulatory Visit: Payer: Self-pay

## 2021-05-17 VITALS — BP 112/59 | Temp 97.7°F | Ht 62.0 in | Wt 277.4 lb

## 2021-05-17 DIAGNOSIS — R0602 Shortness of breath: Secondary | ICD-10-CM | POA: Insufficient documentation

## 2021-05-17 DIAGNOSIS — R600 Localized edema: Secondary | ICD-10-CM | POA: Insufficient documentation

## 2021-05-17 DIAGNOSIS — N184 Chronic kidney disease, stage 4 (severe): Secondary | ICD-10-CM | POA: Insufficient documentation

## 2021-05-17 DIAGNOSIS — Z79899 Other long term (current) drug therapy: Secondary | ICD-10-CM | POA: Insufficient documentation

## 2021-05-17 DIAGNOSIS — N1330 Unspecified hydronephrosis: Secondary | ICD-10-CM | POA: Diagnosis not present

## 2021-05-17 DIAGNOSIS — Z86711 Personal history of pulmonary embolism: Secondary | ICD-10-CM | POA: Diagnosis not present

## 2021-05-17 DIAGNOSIS — C539 Malignant neoplasm of cervix uteri, unspecified: Secondary | ICD-10-CM | POA: Diagnosis not present

## 2021-05-17 DIAGNOSIS — I959 Hypotension, unspecified: Secondary | ICD-10-CM | POA: Insufficient documentation

## 2021-05-17 DIAGNOSIS — I952 Hypotension due to drugs: Secondary | ICD-10-CM | POA: Diagnosis not present

## 2021-05-17 DIAGNOSIS — Z7901 Long term (current) use of anticoagulants: Secondary | ICD-10-CM | POA: Insufficient documentation

## 2021-05-17 DIAGNOSIS — Z86718 Personal history of other venous thrombosis and embolism: Secondary | ICD-10-CM | POA: Diagnosis not present

## 2021-05-17 LAB — CMP (CANCER CENTER ONLY)
ALT: 30 U/L (ref 0–44)
AST: 47 U/L — ABNORMAL HIGH (ref 15–41)
Albumin: 3.3 g/dL — ABNORMAL LOW (ref 3.5–5.0)
Alkaline Phosphatase: 115 U/L (ref 38–126)
Anion gap: 12 (ref 5–15)
BUN: 23 mg/dL (ref 8–23)
CO2: 23 mmol/L (ref 22–32)
Calcium: 9.2 mg/dL (ref 8.9–10.3)
Chloride: 99 mmol/L (ref 98–111)
Creatinine: 2.08 mg/dL — ABNORMAL HIGH (ref 0.44–1.00)
GFR, Estimated: 26 mL/min — ABNORMAL LOW (ref >60)
Glucose, Bld: 120 mg/dL — ABNORMAL HIGH (ref 70–99)
Potassium: 3.3 mmol/L — ABNORMAL LOW (ref 3.5–5.1)
Sodium: 134 mmol/L — ABNORMAL LOW (ref 135–145)
Total Bilirubin: 0.5 mg/dL (ref 0.3–1.2)
Total Protein: 7.9 g/dL (ref 6.5–8.1)

## 2021-05-17 LAB — ABO/RH: ABO/RH(D): A POS

## 2021-05-17 LAB — CBC WITH DIFFERENTIAL (CANCER CENTER ONLY)
Abs Immature Granulocytes: 0.17 10*3/uL — ABNORMAL HIGH (ref 0.00–0.07)
Basophils Absolute: 0.1 10*3/uL (ref 0.0–0.1)
Basophils Relative: 0 %
Eosinophils Absolute: 0.1 10*3/uL (ref 0.0–0.5)
Eosinophils Relative: 1 %
HCT: 34.4 % — ABNORMAL LOW (ref 36.0–46.0)
Hemoglobin: 11.4 g/dL — ABNORMAL LOW (ref 12.0–15.0)
Immature Granulocytes: 1 %
Lymphocytes Relative: 14 %
Lymphs Abs: 2.1 10*3/uL (ref 0.7–4.0)
MCH: 28.6 pg (ref 26.0–34.0)
MCHC: 33.1 g/dL (ref 30.0–36.0)
MCV: 86.2 fL (ref 80.0–100.0)
Monocytes Absolute: 1.3 10*3/uL — ABNORMAL HIGH (ref 0.1–1.0)
Monocytes Relative: 8 %
Neutro Abs: 11.3 10*3/uL — ABNORMAL HIGH (ref 1.7–7.7)
Neutrophils Relative %: 76 %
Platelet Count: 525 10*3/uL — ABNORMAL HIGH (ref 150–400)
RBC: 3.99 MIL/uL (ref 3.87–5.11)
RDW: 14.5 % (ref 11.5–15.5)
WBC Count: 15 10*3/uL — ABNORMAL HIGH (ref 4.0–10.5)
nRBC: 0 % (ref 0.0–0.2)

## 2021-05-17 LAB — MAGNESIUM: Magnesium: 1.2 mg/dL — ABNORMAL LOW (ref 1.7–2.4)

## 2021-05-17 LAB — SAMPLE TO BLOOD BANK
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
## 2021-05-17 NOTE — Assessment & Plan Note (Signed)
This is likely precipitated by side effects of hydrochlorothiazide I am not comfortable prescribing magnesium replacement in the setting of severe chronic kidney failure Hopefully, with discontinuation of hydrochlorothiazide, her magnesium and potassium will improve

## 2021-05-17 NOTE — Assessment & Plan Note (Signed)
I have reviewed her surgical report, pathology report and imaging study Due to her severe chronic kidney disease stage IV, she is not a candidate for concurrent chemotherapy with cisplatin She has PET CT scan scheduled for next week Regardless of the outcome, I think she should proceed with radiation therapy first and reassess after completion of radiation treatment If she had residual disease afterwards, I can prescribe palliative chemotherapy

## 2021-05-17 NOTE — Assessment & Plan Note (Signed)
Blood pressure is low and the patient is not drinking enough fluids I recommend discontinuation of hydrochlorothiazide and increase oral fluid intake

## 2021-05-17 NOTE — Assessment & Plan Note (Signed)
She have severe chronic kidney disease stage IV Even though she has clinical improvement since urinary diversion, her renal function is not good enough for me to prescribe chemotherapy safely We discussed the importance of risk factor modification and improve hydration

## 2021-05-17 NOTE — Telephone Encounter (Signed)
Left a message for her daughter regarding lab results.  Requested a return call to confirm.

## 2021-05-19 ENCOUNTER — Encounter (HOSPITAL_COMMUNITY): Payer: Self-pay | Admitting: *Deleted

## 2021-05-19 ENCOUNTER — Emergency Department (HOSPITAL_COMMUNITY): Payer: Medicaid Other

## 2021-05-19 ENCOUNTER — Other Ambulatory Visit: Payer: Self-pay

## 2021-05-19 ENCOUNTER — Inpatient Hospital Stay (HOSPITAL_COMMUNITY)
Admission: EM | Admit: 2021-05-19 | Discharge: 2021-05-23 | DRG: 698 | Disposition: A | Discharge status: Home Health | Payer: Medicaid Other | Attending: Internal Medicine | Admitting: Internal Medicine

## 2021-05-19 DIAGNOSIS — N1330 Unspecified hydronephrosis: Secondary | ICD-10-CM | POA: Diagnosis not present

## 2021-05-19 DIAGNOSIS — E871 Hypo-osmolality and hyponatremia: Secondary | ICD-10-CM

## 2021-05-19 DIAGNOSIS — D72825 Bandemia: Secondary | ICD-10-CM

## 2021-05-19 DIAGNOSIS — N184 Chronic kidney disease, stage 4 (severe): Secondary | ICD-10-CM | POA: Diagnosis present

## 2021-05-19 DIAGNOSIS — Z6841 Body Mass Index (BMI) 40.0 and over, adult: Secondary | ICD-10-CM | POA: Diagnosis not present

## 2021-05-19 DIAGNOSIS — Z66 Do not resuscitate: Secondary | ICD-10-CM | POA: Diagnosis present

## 2021-05-19 DIAGNOSIS — C539 Malignant neoplasm of cervix uteri, unspecified: Secondary | ICD-10-CM | POA: Diagnosis present

## 2021-05-19 DIAGNOSIS — I480 Paroxysmal atrial fibrillation: Secondary | ICD-10-CM

## 2021-05-19 DIAGNOSIS — Z20822 Contact with and (suspected) exposure to covid-19: Secondary | ICD-10-CM | POA: Diagnosis present

## 2021-05-19 DIAGNOSIS — I10 Essential (primary) hypertension: Secondary | ICD-10-CM | POA: Diagnosis present

## 2021-05-19 DIAGNOSIS — Z86718 Personal history of other venous thrombosis and embolism: Secondary | ICD-10-CM | POA: Diagnosis not present

## 2021-05-19 DIAGNOSIS — G40909 Epilepsy, unspecified, not intractable, without status epilepticus: Secondary | ICD-10-CM

## 2021-05-19 DIAGNOSIS — N179 Acute kidney failure, unspecified: Secondary | ICD-10-CM

## 2021-05-19 DIAGNOSIS — I129 Hypertensive chronic kidney disease with stage 1 through stage 4 chronic kidney disease, or unspecified chronic kidney disease: Secondary | ICD-10-CM | POA: Diagnosis present

## 2021-05-19 DIAGNOSIS — R6520 Severe sepsis without septic shock: Secondary | ICD-10-CM

## 2021-05-19 DIAGNOSIS — T83512A Infection and inflammatory reaction due to nephrostomy catheter, initial encounter: Secondary | ICD-10-CM | POA: Diagnosis present

## 2021-05-19 DIAGNOSIS — A419 Sepsis, unspecified organism: Secondary | ICD-10-CM | POA: Diagnosis present

## 2021-05-19 DIAGNOSIS — Y732 Prosthetic and other implants, materials and accessory gastroenterology and urology devices associated with adverse incidents: Secondary | ICD-10-CM | POA: Diagnosis present

## 2021-05-19 DIAGNOSIS — E872 Acidosis, unspecified: Secondary | ICD-10-CM | POA: Diagnosis present

## 2021-05-19 DIAGNOSIS — I959 Hypotension, unspecified: Secondary | ICD-10-CM

## 2021-05-19 DIAGNOSIS — T83032A Leakage of nephrostomy catheter, initial encounter: Secondary | ICD-10-CM | POA: Diagnosis present

## 2021-05-19 DIAGNOSIS — Z86711 Personal history of pulmonary embolism: Secondary | ICD-10-CM

## 2021-05-19 DIAGNOSIS — E876 Hypokalemia: Secondary | ICD-10-CM

## 2021-05-19 DIAGNOSIS — Z7901 Long term (current) use of anticoagulants: Secondary | ICD-10-CM

## 2021-05-19 DIAGNOSIS — T83022A Displacement of nephrostomy catheter, initial encounter: Secondary | ICD-10-CM | POA: Diagnosis present

## 2021-05-19 DIAGNOSIS — N136 Pyonephrosis: Secondary | ICD-10-CM | POA: Diagnosis present

## 2021-05-19 DIAGNOSIS — Z79899 Other long term (current) drug therapy: Secondary | ICD-10-CM | POA: Diagnosis not present

## 2021-05-19 DIAGNOSIS — H548 Legal blindness, as defined in USA: Secondary | ICD-10-CM | POA: Diagnosis present

## 2021-05-19 LAB — URINALYSIS, ROUTINE W REFLEX MICROSCOPIC
Bilirubin Urine: NEGATIVE
Glucose, UA: NEGATIVE mg/dL
Ketones, ur: NEGATIVE mg/dL
Nitrite: POSITIVE — AB
Protein, ur: 100 mg/dL — AB
Specific Gravity, Urine: 1.015 (ref 1.005–1.030)
pH: 6 (ref 5.0–8.0)

## 2021-05-19 LAB — CBC WITH DIFFERENTIAL/PLATELET
Abs Immature Granulocytes: 0.24 10*3/uL — ABNORMAL HIGH (ref 0.00–0.07)
Basophils Absolute: 0.1 10*3/uL (ref 0.0–0.1)
Basophils Relative: 0 %
Eosinophils Absolute: 0 10*3/uL (ref 0.0–0.5)
Eosinophils Relative: 0 %
HCT: 34.3 % — ABNORMAL LOW (ref 36.0–46.0)
Hemoglobin: 11.2 g/dL — ABNORMAL LOW (ref 12.0–15.0)
Immature Granulocytes: 1 %
Lymphocytes Relative: 8 %
Lymphs Abs: 1.5 10*3/uL (ref 0.7–4.0)
MCH: 29.3 pg (ref 26.0–34.0)
MCHC: 32.7 g/dL (ref 30.0–36.0)
MCV: 89.8 fL (ref 80.0–100.0)
Monocytes Absolute: 1.6 10*3/uL — ABNORMAL HIGH (ref 0.1–1.0)
Monocytes Relative: 8 %
Neutro Abs: 16.4 10*3/uL — ABNORMAL HIGH (ref 1.7–7.7)
Neutrophils Relative %: 83 %
Platelets: 482 10*3/uL — ABNORMAL HIGH (ref 150–400)
RBC: 3.82 MIL/uL — ABNORMAL LOW (ref 3.87–5.11)
RDW: 14.7 % (ref 11.5–15.5)
WBC: 19.8 10*3/uL — ABNORMAL HIGH (ref 4.0–10.5)
nRBC: 0 % (ref 0.0–0.2)

## 2021-05-19 LAB — URINALYSIS, MICROSCOPIC (REFLEX): WBC, UA: >50 WBC/hpf (ref 0–5)

## 2021-05-19 LAB — COMPREHENSIVE METABOLIC PANEL
ALT: 31 U/L (ref 0–44)
AST: 54 U/L — ABNORMAL HIGH (ref 15–41)
Albumin: 2.6 g/dL — ABNORMAL LOW (ref 3.5–5.0)
Alkaline Phosphatase: 90 U/L (ref 38–126)
Anion gap: 11 (ref 5–15)
BUN: 33 mg/dL — ABNORMAL HIGH (ref 8–23)
CO2: 23 mmol/L (ref 22–32)
Calcium: 8.4 mg/dL — ABNORMAL LOW (ref 8.9–10.3)
Chloride: 99 mmol/L (ref 98–111)
Creatinine, Ser: 3.08 mg/dL — ABNORMAL HIGH (ref 0.44–1.00)
GFR, Estimated: 17 mL/min — ABNORMAL LOW (ref >60)
Glucose, Bld: 149 mg/dL — ABNORMAL HIGH (ref 70–99)
Potassium: 3.1 mmol/L — ABNORMAL LOW (ref 3.5–5.1)
Sodium: 133 mmol/L — ABNORMAL LOW (ref 135–145)
Total Bilirubin: 0.8 mg/dL (ref 0.3–1.2)
Total Protein: 7.4 g/dL (ref 6.5–8.1)

## 2021-05-19 LAB — RESP PANEL BY RT-PCR (FLU A&B, COVID) ARPGX2
Influenza A by PCR: NEGATIVE
Influenza B by PCR: NEGATIVE
SARS Coronavirus 2 by RT PCR: NEGATIVE

## 2021-05-19 LAB — PROTIME-INR
INR: 2.7 — ABNORMAL HIGH (ref 0.8–1.2)
Prothrombin Time: 28.3 seconds — ABNORMAL HIGH (ref 11.4–15.2)

## 2021-05-19 LAB — APTT: aPTT: 52 seconds — ABNORMAL HIGH (ref 24–36)

## 2021-05-19 LAB — LACTIC ACID, PLASMA
Lactic Acid, Venous: 2.1 mmol/L (ref 0.5–1.9)
Lactic Acid, Venous: 1.6 mmol/L (ref 0.5–1.9)

## 2021-05-19 MED ORDER — SODIUM CHLORIDE 0.9 % IV SOLN: 2.0000 g | Freq: Once | INTRAVENOUS | Status: DC

## 2021-05-19 MED ORDER — LACTATED RINGERS IV BOLUS
1000.0000 mL | Freq: Once | INTRAVENOUS | Status: AC
  Administered 2021-05-19: 1000 mL via INTRAVENOUS

## 2021-05-19 MED ORDER — PHENOBARBITAL 32.4 MG PO TABS
97.2000 mg | ORAL_TABLET | Freq: Two times a day (BID) | ORAL | Status: DC
  Administered 2021-05-19 – 2021-05-22 (×7): 97.2 mg via ORAL
  Filled 2021-05-19 (×7): qty 3

## 2021-05-19 MED ORDER — POLYETHYLENE GLYCOL 3350 17 G PO PACK
17.0000 g | PACK | Freq: Two times a day (BID) | ORAL | Status: AC
  Administered 2021-05-19: 17 g via ORAL
  Filled 2021-05-19: qty 1

## 2021-05-19 MED ORDER — ONDANSETRON HCL 4 MG/2ML IJ SOLN
4.0000 mg | Freq: Four times a day (QID) | INTRAMUSCULAR | Status: DC | PRN
  Administered 2021-05-20: 4 mg via INTRAVENOUS
  Filled 2021-05-19: qty 2

## 2021-05-19 MED ORDER — SENNOSIDES-DOCUSATE SODIUM 8.6-50 MG PO TABS: 1.0000 | ORAL_TABLET | Freq: Two times a day (BID) | ORAL | Status: DC | PRN

## 2021-05-19 MED ORDER — METRONIDAZOLE 500 MG/100ML IV SOLN
500.0000 mg | Freq: Three times a day (TID) | INTRAVENOUS | Status: DC
  Administered 2021-05-19 – 2021-05-20 (×3): 500 mg via INTRAVENOUS
  Filled 2021-05-19 (×3): qty 100

## 2021-05-19 MED ORDER — SODIUM CHLORIDE 0.9 % IV SOLN
2.0000 g | INTRAVENOUS | Status: DC
  Administered 2021-05-19 – 2021-05-20 (×2): 2 g via INTRAVENOUS
  Filled 2021-05-19 (×2): qty 2

## 2021-05-19 MED ORDER — SENNOSIDES-DOCUSATE SODIUM 8.6-50 MG PO TABS
1.0000 | ORAL_TABLET | Freq: Two times a day (BID) | ORAL | Status: AC
  Administered 2021-05-19: 1 via ORAL
  Filled 2021-05-19: qty 1

## 2021-05-19 MED ORDER — PIPERACILLIN-TAZOBACTAM 3.375 G IVPB 30 MIN
3.3750 g | Freq: Once | INTRAVENOUS | Status: AC
  Administered 2021-05-19: 3.375 g via INTRAVENOUS
  Filled 2021-05-19: qty 50

## 2021-05-19 MED ORDER — ACETAMINOPHEN 325 MG PO TABS
650.0000 mg | ORAL_TABLET | Freq: Four times a day (QID) | ORAL | Status: DC | PRN
  Administered 2021-05-19 – 2021-05-21 (×2): 650 mg via ORAL
  Filled 2021-05-19 (×2): qty 2

## 2021-05-19 MED ORDER — HYDROMORPHONE HCL 1 MG/ML IJ SOLN: 0.5000 mg | INTRAMUSCULAR | Status: DC | PRN

## 2021-05-19 MED ORDER — OXYCODONE HCL 5 MG PO TABS: 5.0000 mg | ORAL_TABLET | Freq: Four times a day (QID) | ORAL | Status: DC | PRN

## 2021-05-19 MED ORDER — ACETAMINOPHEN 650 MG RE SUPP: 650.0000 mg | Freq: Four times a day (QID) | RECTAL | Status: DC | PRN

## 2021-05-19 MED ORDER — VANCOMYCIN HCL 2000 MG/400ML IV SOLN
2000.0000 mg | Freq: Once | INTRAVENOUS | Status: AC
Start: 2021-05-19 — End: 2021-05-19
  Administered 2021-05-19: 2000 mg via INTRAVENOUS
  Filled 2021-05-19: qty 400

## 2021-05-19 MED ORDER — VANCOMYCIN HCL 2000 MG/400ML IV SOLN: 2000.0000 mg | Freq: Once | INTRAVENOUS | Status: DC

## 2021-05-19 MED ORDER — VANCOMYCIN HCL 1250 MG/250ML IV SOLN: 1250.0000 mg | INTRAVENOUS | Status: DC

## 2021-05-19 MED ORDER — SODIUM CHLORIDE 0.9 % IV BOLUS
1000.0000 mL | Freq: Once | INTRAVENOUS | Status: AC
  Administered 2021-05-19: 1000 mL via INTRAVENOUS

## 2021-05-19 MED ORDER — ONDANSETRON HCL 4 MG PO TABS: 4.0000 mg | ORAL_TABLET | Freq: Four times a day (QID) | ORAL | Status: DC | PRN

## 2021-05-19 MED ORDER — METRONIDAZOLE 500 MG/100ML IV SOLN: 500.0000 mg | Freq: Two times a day (BID) | INTRAVENOUS | Status: DC

## 2021-05-19 MED ORDER — POLYETHYLENE GLYCOL 3350 17 G PO PACK: 17.0000 g | PACK | Freq: Two times a day (BID) | ORAL | Status: DC | PRN

## 2021-05-19 MED ORDER — VANCOMYCIN HCL 1750 MG/350ML IV SOLN: 1750.0000 mg | INTRAVENOUS | Status: DC

## 2021-05-19 MED ORDER — POTASSIUM CHLORIDE CRYS ER 20 MEQ PO TBCR
40.0000 meq | EXTENDED_RELEASE_TABLET | Freq: Once | ORAL | Status: AC
  Administered 2021-05-19: 40 meq via ORAL
  Filled 2021-05-19: qty 2

## 2021-05-19 NOTE — Progress Notes (Signed)
Pharmacy Antibiotic Note  Debbie [name] is a 61 y.o. female admitted on 05/19/2021 with  wound infection .  Pharmacy has been consulted for Vancomycin dosing.  Plan: Vancomycin 2000mg  IV loading dose, then 1750 mg IV Q 48 hrs. Goal AUC 400-550. Expected AUC: 497 SCr used: 2.08  F/U cxs and clinical progress Monitor V/S, labs and levels as indicated     Temp (24hrs), Avg:100.5 F (38.1 C), Min:99.9 F (37.7 C), Max:101.1 F (38.4 C)  Recent Labs  Lab 05/17/21 1320 05/19/21 1437 05/19/21 1448  WBC 15.0* 19.8*  --   CREATININE 2.08*  --   --   LATICACIDVEN  --   --  2.1*    Estimated Creatinine Clearance: 35.6 mL/min (A) (by C-G formula based on SCr of 2.08 mg/dL (H)).    No Known Allergies  Antimicrobials this admission: Vancomycin 2/4 >> Zosyn  2/4 >>  Microbiology results: 2/4 BCx: pending 2/4 UCx: pending   MRSA PCR:   Thank you for allowing pharmacy to be a part of this patients care.  Cristy Friedlander 05/19/2021 3:37 PM

## 2021-05-19 NOTE — Assessment & Plan Note (Addendum)
Replete and recheck ?

## 2021-05-19 NOTE — Assessment & Plan Note (Addendum)
Rate controlled-INR therapeutic-BP now creeping up-hence atenolol will be resumed.

## 2021-05-19 NOTE — Assessment & Plan Note (Deleted)
Likely from AKI. -IV fluid as above -Recheck in the morning

## 2021-05-19 NOTE — Assessment & Plan Note (Deleted)
Likely due to sepsis. -Continue monitoring
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
## 2021-05-19 NOTE — Assessment & Plan Note (Deleted)
Anticoagulation as above. ?

## 2021-05-19 NOTE — Progress Notes (Signed)
Pharmacy Antibiotic Note  Debbie [name] is a 61 y.o. female admitted on 05/19/2021 with  wound infection /sepsis Pharmacy has been consulted for Vancomycin and cefepime dosing.  Plan: Vancomycin 2000mg  IV loading dose, then 1750 mg IV Q 48 hrs. Goal AUC 400-550. Expected AUC: 497 SCr used: 2.08  Cefepime 2g, IV q24h F/U cxs and clinical progress Monitor V/S, labs and levels as indicated    Temp (24hrs), Avg:100.3 F (37.9 C), Min:99.9 F (37.7 C), Max:101.1 F (38.4 C)  Recent Labs  Lab 05/17/21 1320 05/19/21 1437 05/19/21 1448 05/19/21 1454 05/19/21 1637  WBC 15.0* 19.8*  --   --   --   CREATININE 2.08*  --   --  3.08*  --   LATICACIDVEN  --   --  2.1*  --  1.6     Estimated Creatinine Clearance: 24 mL/min (A) (by C-G formula based on SCr of 3.08 mg/dL (H)).    No Known Allergies  Antimicrobials this admission: Vancomycin 2/4 >> Zosyn  2/4 x 1 dose Cefepime 2/4>> Flagyl 2/4>>  Microbiology results: 2/4 BCx: pending 2/4 UCx: pending   MRSA PCR:   Thank you for allowing pharmacy to be a part of this patients care.  Isac Sarna, BS Pharm D, [place] Clinical Pharmacist Pager [phone] 05/19/2021 5:22 PM

## 2021-05-19 NOTE — ED Triage Notes (Signed)
States he catheter has been leaking onset last night

## 2021-05-19 NOTE — ED Provider Notes (Signed)
[facility] Provider Note   CSN: [id number] Arrival date & time: 05/19/21  1354     History  Chief Complaint  Patient presents with   catheter leaking    Debbie [name] is a 61 y.o. female.  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  Patient states she woke up this morning and noticed liquid all around her and saw that her left nephrostomy tube was leaking.  She denies any fevers chills side pain stomach pain constipation diarrhea does endorse some nausea and vomiting but states this is normal for her, she states that she has been having good urine output and change her bag every 2 hours, denies hematuria, discoloration of urine or decrease in urination.  She has no complaints other than the leaking catheter she denies any increased pain on the surgical site, no drainage or redness states that they have been changing the bandaging frequently daughter was at bedside able to validate the story   reviewed patient's charts unfortunately due to patient's end-stage renal disease she is not a candidate for chemotherapy, has not received any treatments, and is supposed to start with radiation therapy next week, she is being followed by [facility].  Patient had bilateral nephrostomy tubes placed by Dr. Alyson Ingles 01/05 due to bilateral hydronephrosis.   Home Medications Prior to Admission medications   Medication Sig Start Date End Date Taking? Authorizing Provider  acetaminophen (TYLENOL) 325 MG tablet Take 2 tablets (650 mg total) by mouth every 6 (six) hours as needed for mild pain or moderate pain. 04/29/21   Hongalgi, Lenis Dickinson, MD  atenolol (TENORMIN) 50 MG tablet Take 50 mg by mouth daily.    [provider]  cholecalciferol (VITAMIN D3) 25 MCG (1000 UT) tablet Take 1,000  Units by mouth daily.    [provider]  folic acid (FOLVITE) 1 MG tablet Take 1 tablet (1 mg total) by mouth daily. 04/30/21   Hongalgi, Lenis Dickinson, MD  hydrocortisone 2.5 % cream Apply 1 application topically 2 (two) times daily. 04/11/21   [provider]  oxybutynin (DITROPAN-XL) 10 MG 24 hr tablet Take 10 mg by mouth daily. 04/09/21   [provider]  PHENobarbital (LUMINAL) 97.2 MG tablet Take 97.2 mg by mouth 2 (two) times daily.    [provider]  warfarin (COUMADIN) 5 MG tablet TAKE 2-3 TABLETS BY MOUTH ONCE DAILY AS DIRECTED BY COUMADIN CLINIC 01/11/21   Arnoldo Lenis, MD      Allergies    Patient has no known allergies.    Review of Systems   Review of Systems  Constitutional:  Negative for chills and fever.  Respiratory:  Negative for shortness of breath.   Cardiovascular:  Negative for chest pain.  Gastrointestinal:  Negative for abdominal pain.  Neurological:  Negative for headaches.   Physical Exam Updated Vital Signs BP (!) 90/58    Pulse 90    Temp (!) 101.1 F (38.4 C) (Oral)    Resp (!) 34    SpO2 97%  Physical Exam Vitals and nursing note reviewed.  Constitutional:      General: She is not in acute distress.    Appearance: She is not ill-appearing.  HENT:     Head: Normocephalic and atraumatic.     Nose: No congestion.  Eyes:  Conjunctiva/sclera: Conjunctivae normal.  Cardiovascular:     Rate and Rhythm: Normal rate and regular rhythm.     Pulses: Normal pulses.     Heart sounds: No murmur heard.   No friction rub. No gallop.  Pulmonary:     Effort: No respiratory distress.     Breath sounds: No wheezing, rhonchi or rales.  Abdominal:     Palpations: Abdomen is soft.     Tenderness: There is no abdominal tenderness. There is no right CVA tenderness or left CVA tenderness.     Comments: Patient has bilateral nephrostomy tubes, the left side had purulent drainage coming from the area no palpable  fluctuance/induration, slight surrounding erythema both tubes appear to be in place crystallization noted in the tubing of the left tube, right tube appears to be clear, bag was examined urine was presents,  does not appear to be cloudy or have hematuria present.  Please see picture for full detail  Musculoskeletal:     Right lower leg: Edema present.     Left lower leg: Edema present.     Comments: Pedal edema present bilaterally nonpitting, no skin changes noted, neurovascular fully intact, right leg was slightly larger than left this is baseline per patient  Skin:    General: Skin is warm and dry.  Neurological:     Mental Status: She is alert.  Psychiatric:        Mood and Affect: Mood normal.       ED Results / Procedures / Treatments   Labs (all labs ordered are listed, but only abnormal results are displayed) Labs Reviewed  CBC WITH DIFFERENTIAL/PLATELET - Abnormal; Notable for the following components:      Result Value   WBC 19.8 (*)    RBC 3.82 (*)    Hemoglobin 11.2 (*)    HCT 34.3 (*)    Platelets 482 (*)    Neutro Abs 16.4 (*)    Monocytes Absolute 1.6 (*)    Abs Immature Granulocytes 0.24 (*)    All other components within normal limits  LACTIC ACID, PLASMA - Abnormal; Notable for the following components:   Lactic Acid, Venous 2.1 (*)    All other components within normal limits  COMPREHENSIVE METABOLIC PANEL - Abnormal; Notable for the following components:   Sodium 133 (*)    Potassium 3.1 (*)    Glucose, Bld 149 (*)    BUN 33 (*)    Creatinine, Ser 3.08 (*)    Calcium 8.4 (*)    Albumin 2.6 (*)    AST 54 (*)    GFR, Estimated 17 (*)    All other components within normal limits  CULTURE, BLOOD (ROUTINE X 2)  CULTURE, BLOOD (ROUTINE X 2)  RESP PANEL BY RT-PCR (FLU A&B, COVID) ARPGX2  URINE CULTURE  URINALYSIS, ROUTINE W REFLEX MICROSCOPIC  LACTIC ACID, PLASMA  PROTIME-INR    EKG None  Radiology CT ABDOMEN WO CONTRAST  Result Date:  05/19/2021 CLINICAL DATA:  Nephrostomy tubes, catheter leaking, intra-abdominal abscess suspected EXAM: CT ABDOMEN WITHOUT CONTRAST TECHNIQUE: Multidetector CT imaging of the abdomen was performed following the standard protocol without IV contrast. RADIATION DOSE REDUCTION: This exam was performed according to the departmental dose-optimization program which includes automated exposure control, adjustment of the mA and/or kV according to patient size and/or use of iterative reconstruction technique. COMPARISON:  04/18/2021 FINDINGS: Lower chest: No acute abnormality. Hepatobiliary: No solid liver abnormality is seen. No gallstones, gallbladder wall thickening, or biliary dilatation. Pancreas:  Unremarkable. No pancreatic ductal dilatation or surrounding inflammatory changes. Spleen: Normal in size without significant abnormality. Adrenals/Urinary Tract: Adrenal glands are unremarkable. There are bilateral percutaneous nephrostomy tubes. Left-sided tube has been retracted into the superficial soft tissues overlying the left flank (series 2, image 34). Right-sided tube appears to be positioned within the inferior pole cortex of the right kidney, and is not clearly within collecting (series 2, image 33). There is mild, left greater than right hydronephrosis. Bladder is unremarkable. Stomach/Bowel: Stomach is within normal limits. Appendix appears normal. No evidence of bowel wall thickening, distention, or inflammatory changes. Vascular/Lymphatic: Aortic atherosclerosis. Numerous enlarged retroperitoneal iliac lymph nodes, unchanged compared to prior examination (series 2, image 31 45). Other: No abdominal wall hernia or abnormality. No ascites. Musculoskeletal: No acute or significant osseous findings. IMPRESSION: 1. Bilateral percutaneous nephrostomy tubes. Left-sided tube has been retracted into the superficial soft tissues overlying the left flank. 2. Right-sided tube appears to be positioned within the inferior  pole cortex of the right kidney, and is not clearly within collecting. 3. Mild, left greater than right hydronephrosis. 4. Numerous enlarged retroperitoneal and iliac lymph nodes, unchanged. Aortic Atherosclerosis (ICD10-I70.0). Electronically Signed   By: Delanna Ahmadi M.D.   On: 05/19/2021 16:02    Procedures .Critical Care Performed by: Marcello Fennel, PA-C Authorized by: Marcello Fennel, PA-C   Critical care provider statement:    Critical care time (minutes):  30   Critical care time was exclusive of:  Separately billable procedures and treating other patients   Critical care was necessary to treat or prevent imminent or life-threatening deterioration of the following conditions:  Sepsis   Critical care was time spent personally by me on the following activities:  Ordering and review of laboratory studies, ordering and review of radiographic studies, pulse oximetry, re-evaluation of patient's condition, review of old charts, examination of patient, evaluation of patient's response to treatment and discussions with consultants   I assumed direction of critical care for this patient from another provider in my specialty: no     Care discussed with: admitting provider      Medications Ordered in ED Medications  vancomycin (VANCOREADY) IVPB 2000 mg/400 mL (2,000 mg Intravenous New Bag/Given 05/19/21 1620)    Followed by  vancomycin (VANCOREADY) IVPB 1750 mg/350 mL (has no administration in time range)  sodium chloride 0.9 % bolus 1,000 mL (1,000 mLs Intravenous New Bag/Given 05/19/21 1507)  piperacillin-tazobactam (ZOSYN) IVPB 3.375 g (0 g Intravenous Stopped 05/19/21 1621)  lactated ringers bolus 1,000 mL (1,000 mLs Intravenous New Bag/Given 05/19/21 1622)    ED Course/ Medical Decision Making/ A&P                           Medical Decision Making Amount and/or Complexity of Data Reviewed Labs: ordered. Radiology: ordered.  Risk Prescription drug management. Decision regarding  hospitalization.   This patient presents to the ED for concern of leaking tube, this involves an extensive number of treatment options, and is a complaint that carries with it a high risk of complications and morbidity.  The differential diagnosis includes displacement, blockage, infection    Additional history obtained:  Additional history obtained from chart medical record, daughter is at bedside External records from outside source obtained and reviewed including please see HPI for further detail   Co morbidities that complicate the patient evaluation  CKD stage IV, metastatic disease  Social Determinants of Health:  N/A    Lab  Tests:  I Ordered, and personally interpreted labs.  The pertinent results include: CBC shows leukocytosis of 19.8 neuropsych anemia hemoglobin 11.2 at baseline, lactic 2.1, CMP shows sodium 133 potassium 3.1 glucose 149 BUN 33 creatinine 3.0 baseline appears to be 2 GFR 17   Imaging Studies ordered:  I ordered imaging studies including CT ab pelvis without contrast I independently visualized and interpreted imaging which showed scan shows left nephrostomy tube has been retracted and the superficial soft tissue, right tube is not clearly within the collecting duct hydronephrosis left greater than the right. I agree with the radiologist interpretation     Critical Interventions:  Patient met sepsis criteria on arrival hypotensive febrile with a likely source started on antibiotics fluids.    Reevaluation:  Patient had noted hypotension, febrile and a likely source of left nephrostomy tube she was started on broad-spectrum intra-abdominal antibiotics fluids we will continue to monitor.  Noted that patient has worsening creatinine function low potassium we will change her from normal saline to LR CT scan confirms misplaced left nephrostomy tube we will consult with urology for further recommendations.   Updated patient on recommendation from  urology they are agreement this plan will consult with hospitalist team,   Consultations Obtained:  I requested consultation with the [place] of urology,  and discussed lab and imaging findings as well as pertinent plan - they recommend: Recommends hospital admission, transfer to [facility] consult with IR for placement of nephrostomy tubing.  Likely n.p.o. tonight. Spoke with Dr. Cyndia Skeeters hospitalist team who will admit   Rule out Low suspicion for liver or gallbladder abnormality she has no right upper quadrant tenderness, liver enzymes alk phos T bili all within normal limits.  Low suspicion for bowel obstruction abdomen nondistended dull to percussion nontender on my exam so passing gas and having normal bowel movements.  Low suspicion for an abdominal infection i.e. abscess as there is no acute findings seen on CT abdomen pelvis.    Dispostion and problem list  After consideration of the diagnostic results and the patients response to treatment, I feel that the patent would benefit from hospital admission.  Sepsis-likely secondary due to misplaced nephrostomy tube, continue and IV antibiotics possible admission transfer to [facility] consultation with interventional radiology.            Final Clinical Impression(s) / ED Diagnoses Final diagnoses:  Sepsis without acute organ dysfunction, due to unspecified organism [facility])  Displacement of nephrostomy tube [facility])    Rx / DC Orders ED Discharge Orders     None         Marcello Fennel, PA-C 05/19/21 1649    Noemi Chapel, MD 05/19/21 2111

## 2021-05-19 NOTE — Assessment & Plan Note (Addendum)
Followed by GYN oncology-plan is for radiation.  Per family-needs a CT chest for staging-initially contemplated doing a noncontrast CT chest-however unction continues to improve-creatinine down to 1.88-this is the lowest it has been since her onset of acute illness/obstructive uropathy.  Discussed with her daughter-Debbie [name]-over the phone-suspect we should hold off on a CT chest-repeat electrolytes in the next 1-2 weeks to see if kidney function has improved further-that way contrasted CT chest can be performed if her creatinine permits.

## 2021-05-19 NOTE — ED Provider Notes (Signed)
Medical screening examination/treatment/procedure(s) were conducted as a shared visit with non-physician practitioner(s) and myself.  I personally evaluated the patient during the encounter.  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  Now presents today with increasing fever, weakness, has purulent drainage from the left nephrostomy tube around the tube as well as some cloudy looking material in her bilateral nephrostomy bags.  She has a fever of 101.1 with a blood pressure that was initially 85 systolic, her heart rate is in the upper 90s, she has a nontender abdomen.  She is awake and alert and following commands.  She does appear critically ill, she likely has sepsis if not severe sepsis or septic shock.  Her white blood cell count is 19,000, broad-spectrum antibiotics will be ordered, CT renal study ordered, creatinine 2 days ago was 2.08, baseline is somewhere between 2 and 3-1/2.  Contrast avoided for this reason.  30 cc/kg of IV fluids will be ordered.  Patient will need to be admitted to the hospital.  Please see physician assistant note for critical care attestation.    Noemi Chapel, MD 05/19/21 2111

## 2021-05-19 NOTE — Assessment & Plan Note (Signed)
Continue home phenobarbital

## 2021-05-19 NOTE — Assessment & Plan Note (Addendum)
INR therapeutic-pharmacy following.

## 2021-05-19 NOTE — Assessment & Plan Note (Signed)
Encourage lifestyle change to lose weight

## 2021-05-19 NOTE — Assessment & Plan Note (Addendum)
Sepsis physiology has improved, leukocytosis continues to downtrend-unfortunately urine cultures with multiple species-we will plan on empiric antibiotics for at least 7 days, initially was on cefepime-then transitioned to Rocephin for a few days-will plan to transition to oral antibiotics to complete a 7-day course of treatment.  Patient was evaluated by IR during the earlier part of this hospital stay-and underwent exchange of bilateral nephrostomy tubes on 2/5

## 2021-05-19 NOTE — Assessment & Plan Note (Addendum)
AKI likely multifactorial-from obstructive uropathy and hemodynamic mediated kidney injury in the setting of sepsis.  Creatinine slowly improving with treatment of sepsis-and exchange of nephrostomy tubes.  Suspect that her creatinine might continue to improve-and her underlying CKD levels may actually change-please repeat electrolytes in a week or so

## 2021-05-19 NOTE — Assessment & Plan Note (Deleted)
Management as above °

## 2021-05-19 NOTE — Assessment & Plan Note (Deleted)
Likely from sepsis.  Not symptomatic or tachycardic. -Hold home atenolol -Manage sepsis as above

## 2021-05-20 ENCOUNTER — Inpatient Hospital Stay (HOSPITAL_COMMUNITY): Payer: Medicaid Other

## 2021-05-20 HISTORY — PX: IR NEPHROSTOMY EXCHANGE RIGHT: IMG6070

## 2021-05-20 HISTORY — PX: IR NEPHROSTOMY EXCHANGE LEFT: IMG6069

## 2021-05-20 LAB — COMPREHENSIVE METABOLIC PANEL
ALT: 28 U/L (ref 0–44)
ALT: 24 U/L (ref 0–44)
AST: 55 U/L — ABNORMAL HIGH (ref 15–41)
AST: 47 U/L — ABNORMAL HIGH (ref 15–41)
Albumin: 2.2 g/dL — ABNORMAL LOW (ref 3.5–5.0)
Albumin: 1.9 g/dL — ABNORMAL LOW (ref 3.5–5.0)
Alkaline Phosphatase: 72 U/L (ref 38–126)
Alkaline Phosphatase: 74 U/L (ref 38–126)
Anion gap: 14 (ref 5–15)
Anion gap: 10 (ref 5–15)
BUN: 34 mg/dL — ABNORMAL HIGH (ref 8–23)
BUN: 34 mg/dL — ABNORMAL HIGH (ref 8–23)
CO2: 19 mmol/L — ABNORMAL LOW (ref 22–32)
CO2: 18 mmol/L — ABNORMAL LOW (ref 22–32)
Calcium: 8.3 mg/dL — ABNORMAL LOW (ref 8.9–10.3)
Calcium: 7.9 mg/dL — ABNORMAL LOW (ref 8.9–10.3)
Chloride: 101 mmol/L (ref 98–111)
Chloride: 106 mmol/L (ref 98–111)
Creatinine, Ser: 3.06 mg/dL — ABNORMAL HIGH (ref 0.44–1.00)
Creatinine, Ser: 2.9 mg/dL — ABNORMAL HIGH (ref 0.44–1.00)
GFR, Estimated: 17 mL/min — ABNORMAL LOW (ref >60)
GFR, Estimated: 18 mL/min — ABNORMAL LOW (ref >60)
Glucose, Bld: 102 mg/dL — ABNORMAL HIGH (ref 70–99)
Glucose, Bld: 103 mg/dL — ABNORMAL HIGH (ref 70–99)
Potassium: 3.7 mmol/L (ref 3.5–5.1)
Potassium: 3.3 mmol/L — ABNORMAL LOW (ref 3.5–5.1)
Sodium: 134 mmol/L — ABNORMAL LOW (ref 135–145)
Sodium: 134 mmol/L — ABNORMAL LOW (ref 135–145)
Total Bilirubin: 0.9 mg/dL (ref 0.3–1.2)
Total Bilirubin: 0.9 mg/dL (ref 0.3–1.2)
Total Protein: 6.7 g/dL (ref 6.5–8.1)
Total Protein: 6.1 g/dL — ABNORMAL LOW (ref 6.5–8.1)

## 2021-05-20 LAB — CBC
HCT: 32.7 % — ABNORMAL LOW (ref 36.0–46.0)
HCT: 29.4 % — ABNORMAL LOW (ref 36.0–46.0)
Hemoglobin: 10.5 g/dL — ABNORMAL LOW (ref 12.0–15.0)
Hemoglobin: 9.7 g/dL — ABNORMAL LOW (ref 12.0–15.0)
MCH: 28.7 pg (ref 26.0–34.0)
MCH: 29 pg (ref 26.0–34.0)
MCHC: 32.1 g/dL (ref 30.0–36.0)
MCHC: 33 g/dL (ref 30.0–36.0)
MCV: 89.3 fL (ref 80.0–100.0)
MCV: 87.8 fL (ref 80.0–100.0)
Platelets: 373 10*3/uL (ref 150–400)
Platelets: 343 10*3/uL (ref 150–400)
RBC: 3.66 MIL/uL — ABNORMAL LOW (ref 3.87–5.11)
RBC: 3.35 MIL/uL — ABNORMAL LOW (ref 3.87–5.11)
RDW: 14.5 % (ref 11.5–15.5)
RDW: 14.5 % (ref 11.5–15.5)
WBC: 21.1 10*3/uL — ABNORMAL HIGH (ref 4.0–10.5)
WBC: 20.3 10*3/uL — ABNORMAL HIGH (ref 4.0–10.5)
nRBC: 0 % (ref 0.0–0.2)
nRBC: 0 % (ref 0.0–0.2)

## 2021-05-20 LAB — PHOSPHORUS: Phosphorus: 2.7 mg/dL (ref 2.5–4.6)

## 2021-05-20 LAB — PROTIME-INR
INR: 3.1 — ABNORMAL HIGH (ref 0.8–1.2)
INR: 3.2 — ABNORMAL HIGH (ref 0.8–1.2)
Prothrombin Time: 31.6 seconds — ABNORMAL HIGH (ref 11.4–15.2)
Prothrombin Time: 32.4 seconds — ABNORMAL HIGH (ref 11.4–15.2)

## 2021-05-20 LAB — LACTIC ACID, PLASMA: Lactic Acid, Venous: 1.5 mmol/L (ref 0.5–1.9)

## 2021-05-20 LAB — APTT: aPTT: 54 seconds — ABNORMAL HIGH (ref 24–36)

## 2021-05-20 LAB — MAGNESIUM
Magnesium: 1.1 mg/dL — ABNORMAL LOW (ref 1.7–2.4)
Magnesium: 1 mg/dL — ABNORMAL LOW (ref 1.7–2.4)

## 2021-05-20 MED ORDER — MIDAZOLAM HCL 2 MG/2ML IJ SOLN
INTRAMUSCULAR | Status: AC | PRN
  Administered 2021-05-20: 1 mg via INTRAVENOUS

## 2021-05-20 MED ORDER — LACTATED RINGERS IV SOLN: INTRAVENOUS | Status: AC

## 2021-05-20 MED ORDER — FENTANYL CITRATE (PF) 100 MCG/2ML IJ SOLN
INTRAMUSCULAR | Status: AC | PRN
  Administered 2021-05-20: 50 ug via INTRAVENOUS

## 2021-05-20 MED ORDER — WARFARIN - PHARMACIST DOSING INPATIENT: Freq: Every day | Status: DC

## 2021-05-20 MED ORDER — FENTANYL CITRATE (PF) 100 MCG/2ML IJ SOLN
INTRAMUSCULAR | Status: AC
  Filled 2021-05-20: qty 2

## 2021-05-20 MED ORDER — VANCOMYCIN HCL IN DEXTROSE 1-5 GM/200ML-% IV SOLN: 1000.0000 mg | INTRAVENOUS | Status: DC

## 2021-05-20 MED ORDER — LIDOCAINE HCL 1 % IJ SOLN
INTRAMUSCULAR | Status: AC
  Filled 2021-05-20: qty 20

## 2021-05-20 MED ORDER — CHLORHEXIDINE GLUCONATE 4 % EX LIQD
CUTANEOUS | Status: AC
  Filled 2021-05-20: qty 15

## 2021-05-20 MED ORDER — IOHEXOL 300 MG/ML  SOLN
100.0000 mL | Freq: Once | INTRAMUSCULAR | Status: AC | PRN
  Administered 2021-05-20: 13 mL

## 2021-05-20 MED ORDER — MIDAZOLAM HCL 2 MG/2ML IJ SOLN
INTRAMUSCULAR | Status: AC
  Filled 2021-05-20: qty 2

## 2021-05-20 NOTE — Progress Notes (Signed)
PROGRESS NOTE        PATIENT DETAILS Name: Debbie [name] Age: 61 y.o. Sex: female Date of Birth: Sep 25, 1960 Admit Date: 05/19/2021 Admitting Physician No admitting provider for patient encounter. LHT:DSKAJG, [facility]  Brief Summary: Patient is a 61 y.o.  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  See below for further details.  Significant Hospital events: 1/4-1/15>> AKI due to obstructive hydronephrosis due to stage IV cervical cancer-bilateral nephrostomy tube placed 2/4>> left nephrostomy tube dislodgment of superficial tissue-presenting to [facility] with sepsis complicated UTI  Significant imaging studies: 2/4>> CT abdomen/pelvis: Left nephrostomy tube retracted into superficial tissue, right tube appears to be in the inferior pole of the cortex of the right kidney.  Significant microbiology data: 2/4>> blood culture: No growth 2/4>> urine culture: Pending 2/4>>/COVID PCR: Negative  Procedures: 2/5>> fluoroscopic guided exchange of bilateral nephrostomy tubes.  Consults:  IR, urology  Subjective: Lying comfortably in bed-denies any chest pain or shortness of breath.  Objective: Vitals: Blood pressure 96/66, pulse 91, temperature 98.5 F (36.9 C), temperature source Oral, resp. rate 18, height 5\' 2"  (1.575 m), weight 129 kg, SpO2 100 %.   Exam: Gen Exam:Alert awake-not in any distress HEENT:atraumatic, normocephalic Chest: B/L clear to auscultation anteriorly CVS:S1S2 regular Abdomen:soft non tender, non distended Extremities:no edema Neurology: Non focal Skin: no rash  Pertinent Labs/Radiology: CBC Latest Ref Rng & Units 05/20/2021 05/19/2021 05/17/2021  WBC 4.0 - 10.5 K/uL 20.3(H) 19.8(H)  15.0(H)  Hemoglobin 12.0 - 15.0 g/dL 9.7(L) 11.2(L) 11.4(L)  Hematocrit 36.0 - 46.0 % 29.4(L) 34.3(L) 34.4(L)  Platelets 150 - 400 K/uL 343 482(H) 525(H)    Lab Results  Component Value Date   NA 134 (L) 05/20/2021   K 3.3 (L) 05/20/2021   CL 106 05/20/2021   CO2 18 (L) 05/20/2021      Assessment/Plan: * Severe sepsis due to complicated UTI in patient with bilateral nephrostomy- (present on admission) Sepsis physiology slowly improving-blood pressure remains soft but stable-continue cefepime-but suspect should be okay to discontinue Flagyl and vancomycin.  Follow culture data-appreciate IR assistance and exchanging bilateral nephrostomy tubes.    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  Creatinine slowly improving with treatment of sepsis-and exchange of nephrostomy tubes.  Avoid nephrotoxic agents-recheck electrolytes in a.m.  Hypokalemia- (present on admission) Replete and recheck.  Paroxysmal atrial fibrillation ([place])- (present on admission) Rate controlled-should be okay to resume warfarin per pharmacy.  Hold atenolol until BP stabilizes more.  HTN (hypertension)- (present on admission) BP soft but stable-holding atenolol.  History of pulmonary embolism- (present on admission) INR remains therapeutic-now that nephrostomy tube has been placed-okay to resume per pharmacy.  Seizure disorder ([place]) Continue home phenobarbital  Morbid obesity ([place])- (present on admission) Encourage lifestyle change to lose weight  Malignant neoplasm of cervix ([place])- (present on admission) Followed by GYN oncology-plan is for radiation.  Per family-needs a CT chest for staging-we will plan on a noncontrast CT chest over the next few days.  Bilateral hydronephrosis with bilateral nephrostomy S/p exchange of bilateral nephrostomy tubes-left nephrostomy tube had  dislodged/retracted  and was not in the right place.   Code status:   Code Status: DNR   DVT Prophylaxis: Therapeutic INR-on Coumadin.  Family Communication: Daughter at bedside   Disposition Plan: Status is: Inpatient Remains inpatient appropriate because: Sepsis-due to UTI-nephrostomy tube exchange today.  Awaiting culture data-not yet stable for discharge.    Planned Discharge Destination:Home   Diet: Diet Order             Diet regular Room service appropriate? Yes; Fluid consistency: Thin  Diet effective now                     Antimicrobial agents: Anti-infectives (From admission, onward)    Start     Dose/Rate Route Frequency Ordered Stop   05/21/21 1545  vancomycin (VANCOREADY) IVPB 1750 mg/350 mL  Status:  Discontinued       See Hyperspace for full Linked Orders Report.   1,750 mg 175 mL/hr over 120 Minutes Intravenous Every 48 hours 05/19/21 1540 05/20/21 1143   05/21/21 1545  vancomycin (VANCOCIN) IVPB 1000 mg/200 mL premix       See Hyperspace for full Linked Orders Report.   1,000 mg 200 mL/hr over 60 Minutes Intravenous Every 48 hours 05/20/21 1143     05/20/21 1545  vancomycin (VANCOREADY) IVPB 1250 mg/250 mL  Status:  Discontinued       See Hyperspace for full Linked Orders Report.   1,250 mg 166.7 mL/hr over 90 Minutes Intravenous Every 24 hours 05/19/21 1536 05/19/21 1540   05/19/21 2200  ceFEPIme (MAXIPIME) 2 g in sodium chloride 0.9 % 100 mL IVPB        2 g 200 mL/hr over 30 Minutes Intravenous Every 24 hours 05/19/21 1726     05/19/21 1800  metroNIDAZOLE (FLAGYL) IVPB 500 mg        500 mg 100 mL/hr over 60 Minutes Intravenous Every 8 hours 05/19/21 1733     05/19/21 1730  ceFEPIme (MAXIPIME) 2 g in sodium chloride 0.9 % 100 mL IVPB  Status:  Discontinued        2 g 200 mL/hr over 30 Minutes Intravenous  Once 05/19/21 1716 05/19/21 1725   05/19/21 1730  metroNIDAZOLE (FLAGYL) IVPB 500 mg  Status:  Discontinued        500 mg 100 mL/hr over  60 Minutes Intravenous Every 12 hours 05/19/21 1716 05/19/21 1733   05/19/21 1545  vancomycin (VANCOREADY) IVPB 2000 mg/400 mL  Status:  Discontinued       See Hyperspace for full Linked Orders Report.   2,000 mg 200 mL/hr over 120 Minutes Intravenous  Once 05/19/21 1536 05/19/21 1540   05/19/21 1545  vancomycin (VANCOREADY) IVPB 2000 mg/400 mL       See Hyperspace for full Linked Orders Report.   2,000 mg 200 mL/hr over 120 Minutes Intravenous  Once 05/19/21 1540 05/19/21 1859   05/19/21 1530  piperacillin-tazobactam (ZOSYN) IVPB 3.375 g        3.375 g 100 mL/hr over 30 Minutes Intravenous  Once 05/19/21 1522 05/19/21 1621        MEDICATIONS: Scheduled Meds:  lidocaine       PHENobarbital  97.2 mg Oral BID   polyethylene glycol  17 g Oral BID   senna-docusate  1 tablet Oral BID   Warfarin - Pharmacist Dosing Inpatient   Does not apply q1600   Continuous Infusions:  ceFEPime (MAXIPIME) IV Stopped (05/19/21 2302)   lactated ringers 100 mL/hr at 05/20/21 0321  metronidazole 500 mg (05/20/21 0842)   [START ON 05/21/2021] vancomycin     PRN Meds:.acetaminophen **OR** acetaminophen, HYDROmorphone (DILAUDID) injection, ondansetron **OR** ondansetron (ZOFRAN) IV, oxyCODONE, polyethylene glycol **FOLLOWED BY** polyethylene glycol, senna-docusate **FOLLOWED BY** senna-docusate   I have personally reviewed following labs and imaging studies  LABORATORY DATA: CBC: Recent Labs  Lab 05/17/21 1320 05/19/21 1437 05/20/21 0721  WBC 15.0* 19.8* 20.3*  NEUTROABS 11.3* 16.4*  --   HGB 11.4* 11.2* 9.7*  HCT 34.4* 34.3* 29.4*  MCV 86.2 89.8 87.8  PLT 525* 482* 448    Basic Metabolic Panel: Recent Labs  Lab 05/17/21 1320 05/19/21 1454 05/20/21 0721  NA 134* 133* 134*  K 3.3* 3.1* 3.3*  CL 99 99 106  CO2 23 23 18*  GLUCOSE 120* 149* 103*  BUN 23 33* 34*  CREATININE 2.08* 3.08* 2.90*  CALCIUM 9.2 8.4* 7.9*  MG 1.2*  --  1.0*    GFR: Estimated Creatinine Clearance: 25.9  mL/min (A) (by C-G formula based on SCr of 2.9 mg/dL (H)).  Liver Function Tests: Recent Labs  Lab 05/17/21 1320 05/19/21 1454 05/20/21 0721  AST 47* 54* 47*  ALT 30 31 24   ALKPHOS 115 90 74  BILITOT 0.5 0.8 0.9  PROT 7.9 7.4 6.1*  ALBUMIN 3.3* 2.6* 1.9*   No results for input(s): LIPASE, AMYLASE in the last 168 hours. No results for input(s): AMMONIA in the last 168 hours.  Coagulation Profile: Recent Labs  Lab 05/19/21 1637 05/20/21 0721  INR 2.7* 3.2*    Cardiac Enzymes: No results for input(s): CKTOTAL, CKMB, CKMBINDEX, TROPONINI in the last 168 hours.  BNP (last 3 results) No results for input(s): PROBNP in the last 8760 hours.  Lipid Profile: No results for input(s): CHOL, HDL, LDLCALC, TRIG, CHOLHDL, LDLDIRECT in the last 72 hours.  Thyroid Function Tests: No results for input(s): TSH, T4TOTAL, FREET4, T3FREE, THYROIDAB in the last 72 hours.  Anemia Panel: No results for input(s): VITAMINB12, FOLATE, FERRITIN, TIBC, IRON, RETICCTPCT in the last 72 hours.  Urine analysis:    Component Value Date/Time   COLORURINE YELLOW 05/19/2021 1711   APPEARANCEUR CLEAR 05/19/2021 1711   APPEARANCEUR Hazy (A) 05/04/2021 1421   LABSPEC 1.015 05/19/2021 1711   PHURINE 6.0 05/19/2021 1711   GLUCOSEU NEGATIVE 05/19/2021 1711   HGBUR LARGE (A) 05/19/2021 1711   BILIRUBINUR NEGATIVE 05/19/2021 1711   BILIRUBINUR Negative 05/04/2021 1421   KETONESUR NEGATIVE 05/19/2021 1711   PROTEINUR 100 (A) 05/19/2021 1711   NITRITE POSITIVE (A) 05/19/2021 1711   LEUKOCYTESUR LARGE (A) 05/19/2021 1711    Sepsis Labs: Lactic Acid, Venous    Component Value Date/Time   LATICACIDVEN 1.5 05/19/2021 2340    MICROBIOLOGY: Recent Results (from the past 240 hour(s))  Blood culture (routine x 2)     Status: None (Preliminary result)   Collection Time: 05/19/21  2:49 PM   Specimen: Left Antecubital; Blood  Result Value Ref Range Status   Specimen Description   Final    LEFT  ANTECUBITAL BOTTLES DRAWN AEROBIC AND ANAEROBIC   Special Requests Blood Culture adequate volume  Final   Culture   Final    NO GROWTH < 24 HOURS Performed at [facility], [address], [place], [place] [address]    Report Status PENDING  Incomplete  Blood culture (routine x 2)     Status: None (Preliminary result)   Collection Time: 05/19/21  2:54 PM   Specimen: BLOOD  Result Value Ref Range Status  Specimen Description BLOOD  Final   Special Requests BLOOD  Final   Culture   Final    NO GROWTH < 24 HOURS Performed at [facility], [address] [place], [place] [address]    Report Status PENDING  Incomplete  Resp Panel by RT-PCR (Flu A&B, Covid) Nasopharyngeal Swab     Status: None   Collection Time: 05/19/21  7:43 PM   Specimen: Nasopharyngeal Swab; Nasopharyngeal(NP) swabs in vial transport medium  Result Value Ref Range Status   SARS Coronavirus 2 by RT PCR NEGATIVE NEGATIVE Final    Comment: (NOTE) SARS-CoV-2 target nucleic acids are NOT DETECTED.  The SARS-CoV-2 RNA is generally detectable in upper respiratory specimens during the acute phase of infection. The lowest concentration of SARS-CoV-2 viral copies this assay can detect is 138 copies/mL. A negative result does not preclude SARS-Cov-2 infection and should not be used as the sole basis for treatment or other patient management decisions. A negative result may occur with  improper specimen collection/handling, submission of specimen other than nasopharyngeal swab, presence of viral mutation(s) within the areas targeted by this assay, and inadequate number of viral copies(<138 copies/mL). A negative result must be combined with clinical observations, patient history, and epidemiological information. The expected result is Negative.  Fact Sheet for Patients:  EntrepreneurPulse.com.au  Fact Sheet for Healthcare Providers:  IncredibleEmployment.be  This test is no t yet  approved or cleared by the [place] FDA and  has been authorized for detection and/or diagnosis of SARS-CoV-2 by FDA under an Emergency Use Authorization (EUA). This EUA will remain  in effect (meaning this test can be used) for the duration of the COVID-19 declaration under Section 564(b)(1) of the Act, 21 U.S.C.section 360bbb-3(b)(1), unless the authorization is terminated  or revoked sooner.       Influenza A by PCR NEGATIVE NEGATIVE Final   Influenza B by PCR NEGATIVE NEGATIVE Final    Comment: (NOTE) The Xpert Xpress SARS-CoV-2/FLU/RSV plus assay is intended as an aid in the diagnosis of influenza from Nasopharyngeal swab specimens and should not be used as a sole basis for treatment. Nasal washings and aspirates are unacceptable for Xpert Xpress SARS-CoV-2/FLU/RSV testing.  Fact Sheet for Patients: EntrepreneurPulse.com.au  Fact Sheet for Healthcare Providers: IncredibleEmployment.be  This test is not yet approved or cleared by the [place] FDA and has been authorized for detection and/or diagnosis of SARS-CoV-2 by FDA under an Emergency Use Authorization (EUA). This EUA will remain in effect (meaning this test can be used) for the duration of the COVID-19 declaration under Section 564(b)(1) of the Act, 21 U.S.C. section 360bbb-3(b)(1), unless the authorization is terminated or revoked.  Performed at [facility], [address], [place], [place] [address]     RADIOLOGY STUDIES/RESULTS: CT ABDOMEN WO CONTRAST  Result Date: 05/19/2021 CLINICAL DATA:  Nephrostomy tubes, catheter leaking, intra-abdominal abscess suspected EXAM: CT ABDOMEN WITHOUT CONTRAST TECHNIQUE: Multidetector CT imaging of the abdomen was performed following the standard protocol without IV contrast. RADIATION DOSE REDUCTION: This exam was performed according to the departmental dose-optimization program which includes automated exposure control, adjustment of  the mA and/or kV according to patient size and/or use of iterative reconstruction technique. COMPARISON:  04/18/2021 FINDINGS: Lower chest: No acute abnormality. Hepatobiliary: No solid liver abnormality is seen. No gallstones, gallbladder wall thickening, or biliary dilatation. Pancreas: Unremarkable. No pancreatic ductal dilatation or surrounding inflammatory changes. Spleen: Normal in size without significant abnormality. Adrenals/Urinary Tract: Adrenal glands are unremarkable. There are bilateral percutaneous nephrostomy  tubes. Left-sided tube has been retracted into the superficial soft tissues overlying the left flank (series 2, image 34). Right-sided tube appears to be positioned within the inferior pole cortex of the right kidney, and is not clearly within collecting (series 2, image 33). There is mild, left greater than right hydronephrosis. Bladder is unremarkable. Stomach/Bowel: Stomach is within normal limits. Appendix appears normal. No evidence of bowel wall thickening, distention, or inflammatory changes. Vascular/Lymphatic: Aortic atherosclerosis. Numerous enlarged retroperitoneal iliac lymph nodes, unchanged compared to prior examination (series 2, image 31 45). Other: No abdominal wall hernia or abnormality. No ascites. Musculoskeletal: No acute or significant osseous findings. IMPRESSION: 1. Bilateral percutaneous nephrostomy tubes. Left-sided tube has been retracted into the superficial soft tissues overlying the left flank. 2. Right-sided tube appears to be positioned within the inferior pole cortex of the right kidney, and is not clearly within collecting. 3. Mild, left greater than right hydronephrosis. 4. Numerous enlarged retroperitoneal and iliac lymph nodes, unchanged. Aortic Atherosclerosis (ICD10-I70.0). Electronically Signed   By: Delanna Ahmadi M.D.   On: 05/19/2021 16:02   DG Chest Port 1 View  Result Date: 05/19/2021 CLINICAL DATA:  Tachypnea EXAM: PORTABLE CHEST 1 VIEW COMPARISON:   None FINDINGS: The cardiomediastinal silhouette is unremarkable. There is no evidence of focal airspace disease, pulmonary edema, suspicious pulmonary nodule/mass, pleural effusion, or pneumothorax. No acute bony abnormalities are identified. IMPRESSION: No active disease. Electronically Signed   By: Margarette Canada M.D.   On: 05/19/2021 17:03   IR NEPHROSTOMY EXCHANGE LEFT  Result Date: 05/20/2021 INDICATION: History of cervical cancer, post placement of bilateral percutaneous nephrostomy catheters on 04/21/2021. Patient presented to the emergency department last evening with CT scan demonstrating retraction of the left-sided nephrostomy catheter with end coiled and locked within the subcutaneous tissues of the left flank with mild-to-moderate recurrent left-sided pelvicaliectasis. Additionally, the right-sided nephrostomy catheter has been retracted with end coiled and locked within the right renal cortex with radiopaque side marker retracted outside the renal parenchyma. As such, patient now presents for fluoroscopic guided bilateral replacement/exchange. EXAM: FLUOROSCOPIC GUIDED BILATERAL SIDED NEPHROSTOMY CATHETER EXCHANGE COMPARISON:  CT abdomen pelvis-05/19/2021; 04/18/2021 Image guided bilateral percutaneous nephrostomy catheter placement-04/21/2021 CONTRAST:  A total of 10 mL Isovue-300 administered was administered into both collecting systems FLUOROSCOPY TIME:  3 minutes, 18 seconds (81.4 mGy) COMPLICATIONS: None immediate. TECHNIQUE: Informed written consent was obtained from the patient after a discussion of the risks, benefits and alternatives to treatment. Questions regarding the procedure were encouraged and answered. A timeout was performed prior to the initiation of the procedure. The bilateral flanks and external portions of existing nephrostomy catheters were prepped and draped in the usual sterile fashion. A sterile drape was applied covering the operative field. Maximum barrier sterile  technique with sterile gowns and gloves were used for the procedure. A timeout was performed prior to the initiation of the procedure. Preprocedural spot fluoroscopic image was obtained of the right flank demonstrating retraction of the right-sided nephrostomy catheter. Contrast injection demonstrated the end of the catheter remain partially within the right renal collecting system. As such, the external portion of the drainage catheter was cut and cannulated with an Amplatz wire. Under imaging fluoroscopic guidance, the nephrostomy catheter was exchanged for a short Kumpe catheter which was manipulated to the level of the superior aspect the right ureter. Contrast injection confirmed appropriate positioning. Next, over a short Amplatz wire, a new, slightly larger now 12 French percutaneous nephrostomy catheter was placed with end coiled and locked within the  right renal pelvis. Attention was now paid towards the malpositioned left-sided nephrostomy catheter. Preprocedural spot fluoroscopic image demonstrates significant retraction of the left-sided nephrostomy catheter outside the expected location of the left renal collecting system. Contrast injection fortunately demonstrated a serpiginous tract to the level of the left renal collecting system. The external portion of the catheter was cut and cannulated with a regular glidewire. Under intermittent fluoroscopic guidance, the nephrostomy catheter was exchanged for a short Kumpe catheter which was manipulated through the subcutaneous track to the level of the left renal pelvis. Contrast injection confirmed appropriate positioning Next, over a short Amplatz wire, the Kumpe catheter was exchanged for a new now slightly larger 12 French nephrostomy catheter with end coiled and locked within the left renal pelvis. Both nephrostomy catheters were flushed with a small amount of saline and connected to gravity bags. Both drainage catheters were secured with two interrupted  0 Prolene sutures and StatLock devices. Dressings were applied. The patient tolerated the above procedures well without immediate postprocedural complication. FINDINGS: The bilateral nephrostomy catheters are malpositioned with the left-sided nephrostomy catheter completely outside the left-sided renal collecting system while the right-sided nephrostomy catheter was slightly withdrawn to the edge of the accessed calyx. After successful fluoroscopic guided exchange, and repositioning, the new now slightly larger, 12 French nephrostomy catheters are both appropriately coiled and locked within the respective renal pelvises. IMPRESSION: Successful fluoroscopic guided exchange, repositioning and up sizing of now 12 French bilateral percutaneous nephrostomy catheters. PLAN: - Consideration for future antegrade ureteral stent placement may be performed at the discretion of the urologic service as indicated. - Otherwise, recommend routine fluoroscopic guided bilateral nephrostomy catheter exchange in 6 weeks. Electronically Signed   By: Sandi Mariscal M.D.   On: 05/20/2021 10:43   IR NEPHROSTOMY EXCHANGE RIGHT  Result Date: 05/20/2021 INDICATION: History of cervical cancer, post placement of bilateral percutaneous nephrostomy catheters on 04/21/2021. Patient presented to the emergency department last evening with CT scan demonstrating retraction of the left-sided nephrostomy catheter with end coiled and locked within the subcutaneous tissues of the left flank with mild-to-moderate recurrent left-sided pelvicaliectasis. Additionally, the right-sided nephrostomy catheter has been retracted with end coiled and locked within the right renal cortex with radiopaque side marker retracted outside the renal parenchyma. As such, patient now presents for fluoroscopic guided bilateral replacement/exchange. EXAM: FLUOROSCOPIC GUIDED BILATERAL SIDED NEPHROSTOMY CATHETER EXCHANGE COMPARISON:  CT abdomen pelvis-05/19/2021; 04/18/2021 Image  guided bilateral percutaneous nephrostomy catheter placement-04/21/2021 CONTRAST:  A total of 10 mL Isovue-300 administered was administered into both collecting systems FLUOROSCOPY TIME:  3 minutes, 18 seconds (05.3 mGy) COMPLICATIONS: None immediate. TECHNIQUE: Informed written consent was obtained from the patient after a discussion of the risks, benefits and alternatives to treatment. Questions regarding the procedure were encouraged and answered. A timeout was performed prior to the initiation of the procedure. The bilateral flanks and external portions of existing nephrostomy catheters were prepped and draped in the usual sterile fashion. A sterile drape was applied covering the operative field. Maximum barrier sterile technique with sterile gowns and gloves were used for the procedure. A timeout was performed prior to the initiation of the procedure. Preprocedural spot fluoroscopic image was obtained of the right flank demonstrating retraction of the right-sided nephrostomy catheter. Contrast injection demonstrated the end of the catheter remain partially within the right renal collecting system. As such, the external portion of the drainage catheter was cut and cannulated with an Amplatz wire. Under imaging fluoroscopic guidance, the nephrostomy catheter was exchanged for a  short Kumpe catheter which was manipulated to the level of the superior aspect the right ureter. Contrast injection confirmed appropriate positioning. Next, over a short Amplatz wire, a new, slightly larger now 12 French percutaneous nephrostomy catheter was placed with end coiled and locked within the right renal pelvis. Attention was now paid towards the malpositioned left-sided nephrostomy catheter. Preprocedural spot fluoroscopic image demonstrates significant retraction of the left-sided nephrostomy catheter outside the expected location of the left renal collecting system. Contrast injection fortunately demonstrated a serpiginous  tract to the level of the left renal collecting system. The external portion of the catheter was cut and cannulated with a regular glidewire. Under intermittent fluoroscopic guidance, the nephrostomy catheter was exchanged for a short Kumpe catheter which was manipulated through the subcutaneous track to the level of the left renal pelvis. Contrast injection confirmed appropriate positioning Next, over a short Amplatz wire, the Kumpe catheter was exchanged for a new now slightly larger 12 French nephrostomy catheter with end coiled and locked within the left renal pelvis. Both nephrostomy catheters were flushed with a small amount of saline and connected to gravity bags. Both drainage catheters were secured with two interrupted 0 Prolene sutures and StatLock devices. Dressings were applied. The patient tolerated the above procedures well without immediate postprocedural complication. FINDINGS: The bilateral nephrostomy catheters are malpositioned with the left-sided nephrostomy catheter completely outside the left-sided renal collecting system while the right-sided nephrostomy catheter was slightly withdrawn to the edge of the accessed calyx. After successful fluoroscopic guided exchange, and repositioning, the new now slightly larger, 12 French nephrostomy catheters are both appropriately coiled and locked within the respective renal pelvises. IMPRESSION: Successful fluoroscopic guided exchange, repositioning and up sizing of now 12 French bilateral percutaneous nephrostomy catheters. PLAN: - Consideration for future antegrade ureteral stent placement may be performed at the discretion of the urologic service as indicated. - Otherwise, recommend routine fluoroscopic guided bilateral nephrostomy catheter exchange in 6 weeks. Electronically Signed   By: Sandi Mariscal M.D.   On: 05/20/2021 10:43     LOS: 1 day   Oren Binet, MD  [facility]    To contact the attending provider between 7A-7P or the  covering provider during after hours 7P-7A, please log into the web site [email or web address] and access using universal [place] password for that web site. If you do not have the password, please call the hospital operator.  05/20/2021, 12:14 PM

## 2021-05-20 NOTE — Hospital Course (Addendum)
Patient is a 61 y.o.  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  See below for further details.  Significant Hospital events: 1/4-1/15>> AKI due to obstructive hydronephrosis due to stage IV cervical cancer-bilateral nephrostomy tube placed 2/4>> left nephrostomy tube dislodgment of superficial tissue-presenting to [facility] with sepsis complicated UTI  Significant imaging studies: 2/4>> CT abdomen/pelvis: Left nephrostomy tube retracted into superficial tissue, right tube appears to be in the inferior pole of the cortex of the right kidney.  Significant microbiology data: 2/4>> blood culture: No growth 2/4>> urine culture: Multiple species 2/4>>Flu/COVID PCR: Negative  Procedures: 2/5>> fluoroscopic guided exchange of bilateral nephrostomy tubes.  Consults:  IR, urology

## 2021-05-20 NOTE — Progress Notes (Signed)
[place] for Warfarin Indication:  Hx of DVT/PE and pAFib  No Known Allergies  Patient Measurements: Height: 5\' 2"  (157.5 cm) Weight: 129 kg (284 lb 6.3 oz) IBW/kg (Calculated) : 50.1  Vital Signs: Temp: 100.3 F (37.9 C) (02/05 0337) Temp Source: Oral (02/05 0337) BP: 102/55 (02/05 0337) Pulse Rate: 92 (02/05 0337)  Labs: Recent Labs    05/17/21 1320 05/19/21 1437 05/19/21 1454 05/19/21 1637 05/19/21 1807  HGB 11.4* 11.2*  --   --   --   HCT 34.4* 34.3*  --   --   --   PLT 525* 482*  --   --   --   APTT  --   --   --   --  52*  LABPROT  --   --   --  28.3*  --   INR  --   --   --  2.7*  --   CREATININE 2.08*  --  3.08*  --   --     Estimated Creatinine Clearance: 24.4 mL/min (A) (by C-G formula based on SCr of 3.08 mg/dL (H)).   Medical History: Past Medical History:  Diagnosis Date   Cervix cancer ([place])    DVT (deep venous thrombosis) ([place])    diagnosed at same time as PE, not on blood thinner at that time, was diagnosed with a fib. Was started on lovenox --> Coumadin   Hypertension    Legally blind in right eye, as defined in [place]    Pulmonary embolism ([place])    Seizures ([place])    dx at age 62; last seizure decades ago    Medications:  Medications Prior to Admission  Medication Sig Dispense Refill Last Dose   acetaminophen (TYLENOL) 325 MG tablet Take 2 tablets (650 mg total) by mouth every 6 (six) hours as needed for mild pain or moderate pain.   UNK   atenolol (TENORMIN) 50 MG tablet Take 50 mg by mouth daily.   05/18/2021 at AM   cholecalciferol (VITAMIN D3) 25 MCG (1000 UT) tablet Take 1,000 Units by mouth daily.   0/09/2374   folic acid (FOLVITE) 1 MG tablet Take 1 tablet (1 mg total) by mouth daily. 30 tablet 0 05/18/2021   hydrocortisone 2.5 % cream Apply 1 application topically daily as needed (itch/rash).   UNK   oxybutynin (DITROPAN-XL) 10 MG 24 hr tablet Take 10 mg by mouth daily.   05/18/2021   PHENobarbital  (LUMINAL) 97.2 MG tablet Take 97.2 mg by mouth 2 (two) times daily.   05/18/2021   warfarin (COUMADIN) 5 MG tablet TAKE 2-3 TABLETS BY MOUTH ONCE DAILY AS DIRECTED BY COUMADIN CLINIC 90 tablet 6 05/18/2021 at AM    Assessment: 61 yo F  presents for sepsis 2/2 to UTI. Of note has nephrostomy tubes that were placed about 5-6 weeks ago. PTA warfarin for hx of DVT/PE and pAFib. Warfarin currently held in anticipation for surgery to exchange nephrostomy tubes. INR on admit 05/19/2021: 2.7. Last dose PTA 05/18/2021 AM. Pharmacy consulted to start warfarin.   Home regimen: Warfarin 10mg  daily, except 15mg  on Sundays (total weekly dose: 75 mg), per [facility] clinic visit 05/07/2021  2/5 INR 3.2, supra-therapeutic INR increasing despite holding dose since 2/3. No complications noted with surgery 2/5.  Drug-drug interaction noted with flagyl which may increase AC affect of warfarin.   Goal of Therapy:  INR 2-3 Monitor platelets by anticoagulation protocol: Yes   Plan:  Continue to hold  for today with supra-therapeutic INR.  Okay to resume when appropriate per surgery and TR[facility] Daily INR and CBC  Debbie [name] 05/20/2021,7:20 AM

## 2021-05-20 NOTE — Assessment & Plan Note (Addendum)
BP initially soft but now increasing-resuming atenolol.  Follow-up with PCP for further optimization.

## 2021-05-20 NOTE — Procedures (Signed)
Pre Procedure Dx: Hydronephrosis; Malpositioned bilateral PCNs Post Procedure Dx: Same  Successful fluoro guided exchange and up sizing of now 12 Fr bilateral PCNs.    EBL: None   No immediate complications.   Ronny Bacon, MD Pager #: [phone]
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
## 2021-05-20 NOTE — Assessment & Plan Note (Addendum)
S/p exchange of bilateral nephrostomy tubes-as left nephrostomy tube had been retracted and was in the soft tissue.

## 2021-05-20 NOTE — Progress Notes (Signed)
PHARMACY NOTE:  ANTIMICROBIAL RENAL DOSAGE ADJUSTMENT  Current antimicrobial regimen includes a mismatch between antimicrobial dosage and estimated renal function.  As per policy approved by the Pharmacy & Therapeutics and Medical Executive Committees, the antimicrobial dosage will be adjusted accordingly.  Current antimicrobial dosage:  Vancomycin 1750mg  IV q48h (eAUC 497, Scr 2.08)  Indication: sepsis/wound infection  Renal Function:  Estimated Creatinine Clearance: 25.9 mL/min (A) (by C-G formula based on SCr of 2.9 mg/dL (H)). []      On intermittent HD, scheduled: []      On CRRT    Antimicrobial dosage has been changed to:  Vancomycin 1000mg  IV q48h (eAUC 440, Scr 2.9, Vd 0.5)  Thank you for allowing pharmacy to be a part of this patient's care.  Levonne Spiller, [facility] 05/20/2021 11:39 AM

## 2021-05-21 ENCOUNTER — Ambulatory Visit: Payer: Medicaid Other | Admitting: Radiation Oncology

## 2021-05-21 LAB — PROTIME-INR
INR: 2.6 — ABNORMAL HIGH (ref 0.8–1.2)
Prothrombin Time: 28 seconds — ABNORMAL HIGH (ref 11.4–15.2)

## 2021-05-21 LAB — COMPREHENSIVE METABOLIC PANEL
ALT: 23 U/L (ref 0–44)
AST: 42 U/L — ABNORMAL HIGH (ref 15–41)
Albumin: 1.8 g/dL — ABNORMAL LOW (ref 3.5–5.0)
Alkaline Phosphatase: 67 U/L (ref 38–126)
Anion gap: 11 (ref 5–15)
BUN: 33 mg/dL — ABNORMAL HIGH (ref 8–23)
CO2: 17 mmol/L — ABNORMAL LOW (ref 22–32)
Calcium: 7.8 mg/dL — ABNORMAL LOW (ref 8.9–10.3)
Chloride: 101 mmol/L (ref 98–111)
Creatinine, Ser: 2.72 mg/dL — ABNORMAL HIGH (ref 0.44–1.00)
GFR, Estimated: 19 mL/min — ABNORMAL LOW (ref >60)
Glucose, Bld: 87 mg/dL (ref 70–99)
Potassium: 3 mmol/L — ABNORMAL LOW (ref 3.5–5.1)
Sodium: 129 mmol/L — ABNORMAL LOW (ref 135–145)
Total Bilirubin: 0.6 mg/dL (ref 0.3–1.2)
Total Protein: 6.1 g/dL — ABNORMAL LOW (ref 6.5–8.1)

## 2021-05-21 LAB — CBC
HCT: 28.5 % — ABNORMAL LOW (ref 36.0–46.0)
Hemoglobin: 9.2 g/dL — ABNORMAL LOW (ref 12.0–15.0)
MCH: 28.8 pg (ref 26.0–34.0)
MCHC: 32.3 g/dL (ref 30.0–36.0)
MCV: 89.1 fL (ref 80.0–100.0)
Platelets: 310 10*3/uL (ref 150–400)
RBC: 3.2 MIL/uL — ABNORMAL LOW (ref 3.87–5.11)
RDW: 14.6 % (ref 11.5–15.5)
WBC: 14.1 10*3/uL — ABNORMAL HIGH (ref 4.0–10.5)
nRBC: 0 % (ref 0.0–0.2)

## 2021-05-21 LAB — URINE CULTURE

## 2021-05-21 LAB — MAGNESIUM: Magnesium: 1.1 mg/dL — ABNORMAL LOW (ref 1.7–2.4)

## 2021-05-21 MED ORDER — WARFARIN SODIUM 7.5 MG PO TABS
7.5000 mg | ORAL_TABLET | Freq: Once | ORAL | Status: AC
  Administered 2021-05-21: 7.5 mg via ORAL
  Filled 2021-05-21: qty 1

## 2021-05-21 MED ORDER — SODIUM CHLORIDE 0.9 % IV SOLN: INTRAVENOUS | Status: DC

## 2021-05-21 MED ORDER — SODIUM CHLORIDE 0.9% FLUSH
5.0000 mL | Freq: Three times a day (TID) | INTRAVENOUS | Status: DC
  Administered 2021-05-21: 5 mL

## 2021-05-21 MED ORDER — LOPERAMIDE HCL 2 MG PO CAPS
2.0000 mg | ORAL_CAPSULE | ORAL | Status: DC | PRN
  Administered 2021-05-21: 2 mg via ORAL
  Filled 2021-05-21: qty 1

## 2021-05-21 MED ORDER — SODIUM CHLORIDE 0.9 % IV SOLN
1.0000 g | INTRAVENOUS | Status: DC
  Administered 2021-05-21: 1 g via INTRAVENOUS
  Filled 2021-05-21: qty 10

## 2021-05-21 NOTE — Evaluation (Signed)
Physical Therapy Evaluation Patient Details Name: Debbie [name] MRN: [id number] DOB: 03-07-1961 Today's Date: 05/21/2021  History of Present Illness  61 yo admitted 2/4 with left nephrostomy tube dislodgement and sepsis from complicated UTI. 2/5 nephrostomy tube exchange. PMHx; obstructive uropathy with bil nephrostomy tubes, stage IV cervical CA, Afib, HTN, obesity, seizure disorder  Clinical Impression  Pt with decreased mobility, strength and function with pt declining ambulation at this time. Pt lives at home with daughter and her boyfriend and reports she has assist throughout the day from them if needed. Pt does not walk outside the house and reports having to hold onto furniture for stability with activity. Pt will benefit from acute therapy to maximize mobility, safety and function to decrease burden of care. Encouraged OOB daily with nursing and walking to bathroom for toileting.        Recommendations for follow up therapy are one component of a multi-disciplinary discharge planning process, led by the attending physician.  Recommendations may be updated based on patient status, additional functional criteria and insurance authorization.  Follow Up Recommendations Home health PT    Assistance Recommended at Discharge Intermittent Supervision/Assistance  Patient can return home with the following  A little help with walking and/or transfers;A little help with bathing/dressing/bathroom;Assistance with cooking/housework;Assist for transportation    Equipment Recommendations Rolling walker (2 wheels);BSC/3in1  Recommendations for Other Services       Functional Status Assessment Patient has had a recent decline in their functional status and demonstrates the ability to make significant improvements in function in a reasonable and predictable amount of time.     Precautions / Restrictions Precautions Precautions: Fall Precaution Comments: bil nephrostomy tubes      Mobility   Bed Mobility Overal bed mobility: Needs Assistance Bed Mobility: Sit to Supine       Sit to supine: Min assist   General bed mobility comments: min assist to lift legs to surface to return to supine    Transfers Overall transfer level: Needs assistance   Transfers: Sit to/from Stand, Bed to chair/wheelchair/BSC Sit to Stand: Min guard Stand pivot transfers: Min guard         General transfer comment: guarding for lines, cues for sequence with pt on BSC on arrival and able to stand with cues and safety for lines and back to bed. pt denied OOB to chair or gait    Ambulation/Gait               General Gait Details: pt denied attempting  Stairs            Wheelchair Mobility    Modified Rankin (Stroke Patients Only)       Balance Overall balance assessment: Needs assistance   Sitting balance-Leahy Scale: Fair       Standing balance-Leahy Scale: Fair Standing balance comment: pt able to stand without UE support, reaching out for single UE support with weight shifting                             Pertinent Vitals/Pain Pain Assessment Pain Assessment: No/denies pain    Home Living Family/patient expects to be discharged to:: Private residence Living Arrangements: Children;Non-relatives/Friends Available Help at Discharge: Family;Available 24 hours/day Type of Home: House Home Access: Stairs to enter Entrance Stairs-Rails: None Entrance Stairs-Number of Steps: 2   Home Layout: One level Home Equipment: None Additional Comments: pt reports 3 falls in the last year. pt lives with  daughter her boyfriend and grandkids    Prior Function Prior Level of Function : Independent/Modified Independent             Mobility Comments: pt states she walks in the house furniture walking, doesn't walk outside ADLs Comments: daughter does the cooking and cleaning, pt sponge bathes     Hand Dominance        Extremity/Trunk Assessment   Upper  Extremity Assessment Upper Extremity Assessment: Generalized weakness    Lower Extremity Assessment Lower Extremity Assessment: Generalized weakness    Cervical / Trunk Assessment Cervical / Trunk Assessment: Lordotic  Communication   Communication: No difficulties  Cognition Arousal/Alertness: Awake/alert Behavior During Therapy: WFL for tasks assessed/performed Overall Cognitive Status: Within Functional Limits for tasks assessed                                          General Comments      Exercises     Assessment/Plan    PT Assessment Patient needs continued PT services  PT Problem List Decreased strength;Decreased mobility;Decreased safety awareness;Decreased activity tolerance;Decreased balance;Decreased knowledge of use of DME       PT Treatment Interventions Gait training;Functional mobility training;Therapeutic activities;Patient/family education;Therapeutic exercise;DME instruction    PT Goals (Current goals can be found in the Care Plan section)  Acute Rehab PT Goals Patient Stated Goal: return home PT Goal Formulation: With patient Time For Goal Achievement: 06/04/21 Potential to Achieve Goals: Fair    Frequency Min 3X/week     Co-evaluation               AM-PAC PT "6 Clicks" Mobility  Outcome Measure Help needed turning from your back to your side while in a flat bed without using bedrails?: A Little Help needed moving from lying on your back to sitting on the side of a flat bed without using bedrails?: A Little Help needed moving to and from a bed to a chair (including a wheelchair)?: A Little Help needed standing up from a chair using your arms (e.g., wheelchair or bedside chair)?: A Little Help needed to walk in hospital room?: A Lot Help needed climbing 3-5 steps with a railing? : Total 6 Click Score: 15    End of Session   Activity Tolerance: Patient tolerated treatment well Patient left: in bed;with call bell/phone  within reach Nurse Communication: Mobility status PT Visit Diagnosis: Other abnormalities of gait and mobility (R26.89);Difficulty in walking, not elsewhere classified (R26.2);Muscle weakness (generalized) (M62.81);Repeated falls (R29.6)    Time: 7622-6333 PT Time Calculation (min) (ACUTE ONLY): 22 min   Charges:   PT Evaluation $PT Eval Moderate Complexity: 1 Mod          Aleenah Homen P, PT Acute Rehabilitation Services Pager: [phone] Office: [phone]   Yarethzy Croak B Seydina Holliman 05/21/2021, 10:28 AM

## 2021-05-21 NOTE — Progress Notes (Signed)
PROGRESS NOTE        PATIENT DETAILS Name: Debbie [name] Age: 61 y.o. Sex: female Date of Birth: Feb 24, 1961 Admit Date: 05/19/2021 Admitting Physician No admitting provider for patient encounter. VHQ:[facility], [facility]  Brief Summary: Patient is a 61 y.o.  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  See below for further details.  Significant Hospital events: 1/4-1/15>> AKI due to obstructive hydronephrosis due to stage IV cervical cancer-bilateral nephrostomy tube placed 2/4>> left nephrostomy tube dislodgment of superficial tissue-presenting to [facility] with sepsis complicated UTI  Significant imaging studies: 2/4>> CT abdomen/pelvis: Left nephrostomy tube retracted into superficial tissue, right tube appears to be in the inferior pole of the cortex of the right kidney.  Significant microbiology data: 2/4>> blood culture: No growth 2/4>> urine culture: Multiple species 2/4>>Flu/COVID PCR: Negative  Procedures: 2/5>> fluoroscopic guided exchange of bilateral nephrostomy tubes.  Consults:  IR, urology  Subjective: Sitting up in chair-feels much better.  No nausea or vomiting.  Objective: Vitals: Blood pressure (!) 84/50, pulse 95, temperature 98.6 F (37 C), temperature source Oral, resp. rate 17, height 5\' 2"  (1.575 m), weight 129 kg, SpO2 97 %.   Exam: Gen Exam:Alert awake-not in any distress HEENT:atraumatic, normocephalic Chest: B/L clear to auscultation anteriorly CVS:S1S2 regular Abdomen:soft non tender, non distended Extremities:no edema Neurology: Non focal Skin: no rash   Pertinent Labs/Radiology: CBC Latest Ref Rng & Units 05/21/2021 05/20/2021 05/20/2021  WBC 4.0 - 10.5 K/uL 14.1(H)  20.3(H) 21.1(H)  Hemoglobin 12.0 - 15.0 g/dL 9.2(L) 9.7(L) 10.5(L)  Hematocrit 36.0 - 46.0 % 28.5(L) 29.4(L) 32.7(L)  Platelets 150 - 400 K/uL 310 343 373    Lab Results  Component Value Date   NA 129 (L) 05/21/2021   K 3.0 (L) 05/21/2021   CL 101 05/21/2021   CO2 17 (L) 05/21/2021      Assessment/Plan: * Severe sepsis due to complicated UTI in patient with bilateral nephrostomy- (present on admission) Sepsis physiology improving-low-grade fluid but clinically appears much better.  Leukocytosis downtrending as well.  Unfortunately urine cultures with multiple species-blood cultures negative.  Will transition to Rocephin-and empirically treat with antimicrobial therapy for at least 7 days.  S/p exchange of bilateral nephrostomy tubes on 2 2/5.   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  Creatinine slowly improving with treatment of sepsis-and exchange of nephrostomy tubes.  Avoid nephrotoxic agents-recheck electrolytes in a.m.  Hypokalemia- (present on admission) Replete and recheck.  Hyponatremia- (present on admission) Mild hyponatremia-she is asymptomatic-stop IVF and recheck electrolytes tomorrow.  HTN (hypertension)- (present on admission) BP soft but stable-holding atenolol.  Paroxysmal atrial fibrillation ([place])- (present on admission) Rate controlled-INR therapeutic-continue to hold atenolol-allow BP to improve further before resuming.  History of pulmonary embolism- (present on admission) INR therapeutic-pharmacy following.  Seizure disorder ([place]) Continue home phenobarbital  Morbid obesity ([place])- (present on admission) Encourage lifestyle change to lose weight  Malignant neoplasm of cervix ([place])- (present on admission) Followed by GYN oncology-plan is for radiation.  Per family-needs a CT chest for staging-we will plan on a noncontrast CT chest  over  the next few days.  Bilateral hydronephrosis with bilateral nephrostomy S/p exchange of bilateral nephrostomy tubes-as left nephrostomy tube had been retracted and was in the soft tissue.   Code status:   Code Status: DNR   DVT Prophylaxis: Therapeutic INR-on Coumadin.  warfarin (COUMADIN) tablet 7.5 mg   Family Communication:None at bedside   Disposition Plan: Status is: Inpatient Remains inpatient appropriate because: Sepsis-due to UTI-nephrostomy tube exchange today.  Awaiting culture data-not yet stable for discharge.    Planned Discharge Destination:Home   Diet: Diet Order             Diet regular Room service appropriate? Yes; Fluid consistency: Thin  Diet effective now                     Antimicrobial agents: Anti-infectives (From admission, onward)    Start     Dose/Rate Route Frequency Ordered Stop   05/21/21 2000  cefTRIAXone (ROCEPHIN) 1 g in sodium chloride 0.9 % 100 mL IVPB        1 g 200 mL/hr over 30 Minutes Intravenous Every 24 hours 05/21/21 1436 05/26/21 1959   05/21/21 1545  vancomycin (VANCOREADY) IVPB 1750 mg/350 mL  Status:  Discontinued       See Hyperspace for full Linked Orders Report.   1,750 mg 175 mL/hr over 120 Minutes Intravenous Every 48 hours 05/19/21 1540 05/20/21 1143   05/21/21 1545  vancomycin (VANCOCIN) IVPB 1000 mg/200 mL premix  Status:  Discontinued       See Hyperspace for full Linked Orders Report.   1,000 mg 200 mL/hr over 60 Minutes Intravenous Every 48 hours 05/20/21 1143 05/20/21 1214   05/20/21 1545  vancomycin (VANCOREADY) IVPB 1250 mg/250 mL  Status:  Discontinued       See Hyperspace for full Linked Orders Report.   1,250 mg 166.7 mL/hr over 90 Minutes Intravenous Every 24 hours 05/19/21 1536 05/19/21 1540   05/19/21 2200  ceFEPIme (MAXIPIME) 2 g in sodium chloride 0.9 % 100 mL IVPB  Status:  Discontinued        2 g 200 mL/hr over 30 Minutes Intravenous Every 24 hours 05/19/21 1726 05/21/21 1436    05/19/21 1800  metroNIDAZOLE (FLAGYL) IVPB 500 mg  Status:  Discontinued        500 mg 100 mL/hr over 60 Minutes Intravenous Every 8 hours 05/19/21 1733 05/20/21 1214   05/19/21 1730  ceFEPIme (MAXIPIME) 2 g in sodium chloride 0.9 % 100 mL IVPB  Status:  Discontinued        2 g 200 mL/hr over 30 Minutes Intravenous  Once 05/19/21 1716 05/19/21 1725   05/19/21 1730  metroNIDAZOLE (FLAGYL) IVPB 500 mg  Status:  Discontinued        500 mg 100 mL/hr over 60 Minutes Intravenous Every 12 hours 05/19/21 1716 05/19/21 1733   05/19/21 1545  vancomycin (VANCOREADY) IVPB 2000 mg/400 mL  Status:  Discontinued       See Hyperspace for full Linked Orders Report.   2,000 mg 200 mL/hr over 120 Minutes Intravenous  Once 05/19/21 1536 05/19/21 1540   05/19/21 1545  vancomycin (VANCOREADY) IVPB 2000 mg/400 mL       See Hyperspace for full Linked Orders Report.   2,000 mg 200 mL/hr over 120 Minutes Intravenous  Once 05/19/21 1540 05/19/21 1859   05/19/21 1530  piperacillin-tazobactam (ZOSYN) IVPB 3.375 g        3.375 g 100 mL/hr over 30 Minutes Intravenous  Once  05/19/21 1522 05/19/21 1621        MEDICATIONS: Scheduled Meds:  PHENobarbital  97.2 mg Oral BID   warfarin  7.5 mg Oral ONCE-1600   Warfarin - Pharmacist Dosing Inpatient   Does not apply q1600   Continuous Infusions:  sodium chloride 75 mL/hr at 05/21/21 0837   cefTRIAXone (ROCEPHIN)  IV     PRN Meds:.acetaminophen **OR** acetaminophen, HYDROmorphone (DILAUDID) injection, loperamide, ondansetron **OR** ondansetron (ZOFRAN) IV, oxyCODONE, [EXPIRED] polyethylene glycol **FOLLOWED BY** polyethylene glycol, [EXPIRED] senna-docusate **FOLLOWED BY** senna-docusate   I have personally reviewed following labs and imaging studies  LABORATORY DATA: CBC: Recent Labs  Lab 05/17/21 1320 05/19/21 1437 05/20/21 0155 05/20/21 0721 05/21/21 0221  WBC 15.0* 19.8* 21.1* 20.3* 14.1*  NEUTROABS 11.3* 16.4*  --   --   --   HGB 11.4* 11.2* 10.5*  9.7* 9.2*  HCT 34.4* 34.3* 32.7* 29.4* 28.5*  MCV 86.2 89.8 89.3 87.8 89.1  PLT 525* 482* 373 343 599    Basic Metabolic Panel: Recent Labs  Lab 05/17/21 1320 05/19/21 1454 05/20/21 0155 05/20/21 0721 05/21/21 0221  NA 134* 133* 134* 134* 129*  K 3.3* 3.1* 3.7 3.3* 3.0*  CL 99 99 101 106 101  CO2 23 23 19* 18* 17*  GLUCOSE 120* 149* 102* 103* 87  BUN 23 33* 34* 34* 33*  CREATININE 2.08* 3.08* 3.06* 2.90* 2.72*  CALCIUM 9.2 8.4* 8.3* 7.9* 7.8*  MG 1.2*  --  1.1* 1.0* 1.1*  PHOS  --   --  2.7  --   --     GFR: Estimated Creatinine Clearance: 27.7 mL/min (A) (by C-G formula based on SCr of 2.72 mg/dL (H)).  Liver Function Tests: Recent Labs  Lab 05/17/21 1320 05/19/21 1454 05/20/21 0155 05/20/21 0721 05/21/21 0221  AST 47* 54* 55* 47* 42*  ALT 30 31 28 24 23   ALKPHOS 115 90 72 74 67  BILITOT 0.5 0.8 0.9 0.9 0.6  PROT 7.9 7.4 6.7 6.1* 6.1*  ALBUMIN 3.3* 2.6* 2.2* 1.9* 1.8*   No results for input(s): LIPASE, AMYLASE in the last 168 hours. No results for input(s): AMMONIA in the last 168 hours.  Coagulation Profile: Recent Labs  Lab 05/19/21 1637 05/20/21 0155 05/20/21 0721 05/21/21 0832  INR 2.7* 3.1* 3.2* 2.6*    Cardiac Enzymes: No results for input(s): CKTOTAL, CKMB, CKMBINDEX, TROPONINI in the last 168 hours.  BNP (last 3 results) No results for input(s): PROBNP in the last 8760 hours.  Lipid Profile: No results for input(s): CHOL, HDL, LDLCALC, TRIG, CHOLHDL, LDLDIRECT in the last 72 hours.  Thyroid Function Tests: No results for input(s): TSH, T4TOTAL, FREET4, T3FREE, THYROIDAB in the last 72 hours.  Anemia Panel: No results for input(s): VITAMINB12, FOLATE, FERRITIN, TIBC, IRON, RETICCTPCT in the last 72 hours.  Urine analysis:    Component Value Date/Time   COLORURINE YELLOW 05/19/2021 1711   APPEARANCEUR CLEAR 05/19/2021 1711   APPEARANCEUR Hazy (A) 05/04/2021 1421   LABSPEC 1.015 05/19/2021 1711   PHURINE 6.0 05/19/2021 1711    GLUCOSEU NEGATIVE 05/19/2021 1711   HGBUR LARGE (A) 05/19/2021 1711   BILIRUBINUR NEGATIVE 05/19/2021 1711   BILIRUBINUR Negative 05/04/2021 1421   KETONESUR NEGATIVE 05/19/2021 1711   PROTEINUR 100 (A) 05/19/2021 1711   NITRITE POSITIVE (A) 05/19/2021 1711   LEUKOCYTESUR LARGE (A) 05/19/2021 1711    Sepsis Labs: Lactic Acid, Venous    Component Value Date/Time   LATICACIDVEN 1.5 05/19/2021 2340    MICROBIOLOGY: Recent Results (from the  past 240 hour(s))  Blood culture (routine x 2)     Status: None (Preliminary result)   Collection Time: 05/19/21  2:49 PM   Specimen: Left Antecubital; Blood  Result Value Ref Range Status   Specimen Description   Final    LEFT ANTECUBITAL BOTTLES DRAWN AEROBIC AND ANAEROBIC   Special Requests Blood Culture adequate volume  Final   Culture   Final    NO GROWTH < 24 HOURS Performed at [facility], [address] [place], [place] [address]    Report Status PENDING  Incomplete  Blood culture (routine x 2)     Status: None (Preliminary result)   Collection Time: 05/19/21  2:54 PM   Specimen: BLOOD  Result Value Ref Range Status   Specimen Description BLOOD  Final   Special Requests BLOOD  Final   Culture   Final    NO GROWTH < 24 HOURS Performed at [facility], [address], [place], [place] [address]    Report Status PENDING  Incomplete  Urine Culture     Status: Abnormal   Collection Time: 05/19/21  5:11 PM   Specimen: Urine, Catheterized  Result Value Ref Range Status   Specimen Description   Final    URINE, CATHETERIZED Performed at [facility], [address], [place], [place] [address]    Special Requests   Final    NONE Performed at [facility], [address] [place], [place] [address]    Culture MULTIPLE SPECIES PRESENT, SUGGEST RECOLLECTION (A)  Final   Report Status 05/21/2021 FINAL  Final  Resp Panel by RT-PCR (Flu A&B, Covid) Nasopharyngeal Swab     Status: None   Collection Time: 05/19/21  7:43 PM   Specimen:  Nasopharyngeal Swab; Nasopharyngeal(NP) swabs in vial transport medium  Result Value Ref Range Status   SARS Coronavirus 2 by RT PCR NEGATIVE NEGATIVE Final    Comment: (NOTE) SARS-CoV-2 target nucleic acids are NOT DETECTED.  The SARS-CoV-2 RNA is generally detectable in upper respiratory specimens during the acute phase of infection. The lowest concentration of SARS-CoV-2 viral copies this assay can detect is 138 copies/mL. A negative result does not preclude SARS-Cov-2 infection and should not be used as the sole basis for treatment or other patient management decisions. A negative result may occur with  improper specimen collection/handling, submission of specimen other than nasopharyngeal swab, presence of viral mutation(s) within the areas targeted by this assay, and inadequate number of viral copies(<138 copies/mL). A negative result must be combined with clinical observations, patient history, and epidemiological information. The expected result is Negative.  Fact Sheet for Patients:  EntrepreneurPulse.com.au  Fact Sheet for Healthcare Providers:  IncredibleEmployment.be  This test is no t yet approved or cleared by the [place] FDA and  has been authorized for detection and/or diagnosis of SARS-CoV-2 by FDA under an Emergency Use Authorization (EUA). This EUA will remain  in effect (meaning this test can be used) for the duration of the COVID-19 declaration under Section 564(b)(1) of the Act, 21 U.S.C.section 360bbb-3(b)(1), unless the authorization is terminated  or revoked sooner.       Influenza A by PCR NEGATIVE NEGATIVE Final   Influenza B by PCR NEGATIVE NEGATIVE Final    Comment: (NOTE) The Xpert Xpress SARS-CoV-2/FLU/RSV plus assay is intended as an aid in the diagnosis of influenza from Nasopharyngeal swab specimens and should not be used as a sole basis for treatment. Nasal washings and aspirates are unacceptable for  Xpert Xpress SARS-CoV-2/FLU/RSV  testing.  Fact Sheet for Patients: EntrepreneurPulse.com.au  Fact Sheet for Healthcare Providers: IncredibleEmployment.be  This test is not yet approved or cleared by the [place] FDA and has been authorized for detection and/or diagnosis of SARS-CoV-2 by FDA under an Emergency Use Authorization (EUA). This EUA will remain in effect (meaning this test can be used) for the duration of the COVID-19 declaration under Section 564(b)(1) of the Act, 21 U.S.C. section 360bbb-3(b)(1), unless the authorization is terminated or revoked.  Performed at [facility], [address], [place], [place] [address]     RADIOLOGY STUDIES/RESULTS: CT ABDOMEN WO CONTRAST  Result Date: 05/19/2021 CLINICAL DATA:  Nephrostomy tubes, catheter leaking, intra-abdominal abscess suspected EXAM: CT ABDOMEN WITHOUT CONTRAST TECHNIQUE: Multidetector CT imaging of the abdomen was performed following the standard protocol without IV contrast. RADIATION DOSE REDUCTION: This exam was performed according to the departmental dose-optimization program which includes automated exposure control, adjustment of the mA and/or kV according to patient size and/or use of iterative reconstruction technique. COMPARISON:  04/18/2021 FINDINGS: Lower chest: No acute abnormality. Hepatobiliary: No solid liver abnormality is seen. No gallstones, gallbladder wall thickening, or biliary dilatation. Pancreas: Unremarkable. No pancreatic ductal dilatation or surrounding inflammatory changes. Spleen: Normal in size without significant abnormality. Adrenals/Urinary Tract: Adrenal glands are unremarkable. There are bilateral percutaneous nephrostomy tubes. Left-sided tube has been retracted into the superficial soft tissues overlying the left flank (series 2, image 34). Right-sided tube appears to be positioned within the inferior pole cortex of the right kidney, and is not clearly  within collecting (series 2, image 33). There is mild, left greater than right hydronephrosis. Bladder is unremarkable. Stomach/Bowel: Stomach is within normal limits. Appendix appears normal. No evidence of bowel wall thickening, distention, or inflammatory changes. Vascular/Lymphatic: Aortic atherosclerosis. Numerous enlarged retroperitoneal iliac lymph nodes, unchanged compared to prior examination (series 2, image 31 45). Other: No abdominal wall hernia or abnormality. No ascites. Musculoskeletal: No acute or significant osseous findings. IMPRESSION: 1. Bilateral percutaneous nephrostomy tubes. Left-sided tube has been retracted into the superficial soft tissues overlying the left flank. 2. Right-sided tube appears to be positioned within the inferior pole cortex of the right kidney, and is not clearly within collecting. 3. Mild, left greater than right hydronephrosis. 4. Numerous enlarged retroperitoneal and iliac lymph nodes, unchanged. Aortic Atherosclerosis (ICD10-I70.0). Electronically Signed   By: Delanna Ahmadi M.D.   On: 05/19/2021 16:02   DG Chest Port 1 View  Result Date: 05/19/2021 CLINICAL DATA:  Tachypnea EXAM: PORTABLE CHEST 1 VIEW COMPARISON:  None FINDINGS: The cardiomediastinal silhouette is unremarkable. There is no evidence of focal airspace disease, pulmonary edema, suspicious pulmonary nodule/mass, pleural effusion, or pneumothorax. No acute bony abnormalities are identified. IMPRESSION: No active disease. Electronically Signed   By: Margarette Canada M.D.   On: 05/19/2021 17:03   IR NEPHROSTOMY EXCHANGE LEFT  Result Date: 05/20/2021 INDICATION: History of cervical cancer, post placement of bilateral percutaneous nephrostomy catheters on 04/21/2021. Patient presented to the emergency department last evening with CT scan demonstrating retraction of the left-sided nephrostomy catheter with end coiled and locked within the subcutaneous tissues of the left flank with mild-to-moderate recurrent  left-sided pelvicaliectasis. Additionally, the right-sided nephrostomy catheter has been retracted with end coiled and locked within the right renal cortex with radiopaque side marker retracted outside the renal parenchyma. As such, patient now presents for fluoroscopic guided bilateral replacement/exchange. EXAM: FLUOROSCOPIC GUIDED BILATERAL SIDED NEPHROSTOMY CATHETER EXCHANGE COMPARISON:  CT abdomen pelvis-05/19/2021; 04/18/2021 Image guided bilateral percutaneous nephrostomy catheter placement-04/21/2021 CONTRAST:  A total of 10 mL Isovue-300 administered was administered into both collecting systems FLUOROSCOPY TIME:  3 minutes, 18 seconds (47.4 mGy) COMPLICATIONS: None immediate. TECHNIQUE: Informed written consent was obtained from the patient after a discussion of the risks, benefits and alternatives to treatment. Questions regarding the procedure were encouraged and answered. A timeout was performed prior to the initiation of the procedure. The bilateral flanks and external portions of existing nephrostomy catheters were prepped and draped in the usual sterile fashion. A sterile drape was applied covering the operative field. Maximum barrier sterile technique with sterile gowns and gloves were used for the procedure. A timeout was performed prior to the initiation of the procedure. Preprocedural spot fluoroscopic image was obtained of the right flank demonstrating retraction of the right-sided nephrostomy catheter. Contrast injection demonstrated the end of the catheter remain partially within the right renal collecting system. As such, the external portion of the drainage catheter was cut and cannulated with an Amplatz wire. Under imaging fluoroscopic guidance, the nephrostomy catheter was exchanged for a short Kumpe catheter which was manipulated to the level of the superior aspect the right ureter. Contrast injection confirmed appropriate positioning. Next, over a short Amplatz wire, a new, slightly  larger now 12 French percutaneous nephrostomy catheter was placed with end coiled and locked within the right renal pelvis. Attention was now paid towards the malpositioned left-sided nephrostomy catheter. Preprocedural spot fluoroscopic image demonstrates significant retraction of the left-sided nephrostomy catheter outside the expected location of the left renal collecting system. Contrast injection fortunately demonstrated a serpiginous tract to the level of the left renal collecting system. The external portion of the catheter was cut and cannulated with a regular glidewire. Under intermittent fluoroscopic guidance, the nephrostomy catheter was exchanged for a short Kumpe catheter which was manipulated through the subcutaneous track to the level of the left renal pelvis. Contrast injection confirmed appropriate positioning Next, over a short Amplatz wire, the Kumpe catheter was exchanged for a new now slightly larger 12 French nephrostomy catheter with end coiled and locked within the left renal pelvis. Both nephrostomy catheters were flushed with a small amount of saline and connected to gravity bags. Both drainage catheters were secured with two interrupted 0 Prolene sutures and StatLock devices. Dressings were applied. The patient tolerated the above procedures well without immediate postprocedural complication. FINDINGS: The bilateral nephrostomy catheters are malpositioned with the left-sided nephrostomy catheter completely outside the left-sided renal collecting system while the right-sided nephrostomy catheter was slightly withdrawn to the edge of the accessed calyx. After successful fluoroscopic guided exchange, and repositioning, the new now slightly larger, 12 French nephrostomy catheters are both appropriately coiled and locked within the respective renal pelvises. IMPRESSION: Successful fluoroscopic guided exchange, repositioning and up sizing of now 12 French bilateral percutaneous nephrostomy  catheters. PLAN: - Consideration for future antegrade ureteral stent placement may be performed at the discretion of the urologic service as indicated. - Otherwise, recommend routine fluoroscopic guided bilateral nephrostomy catheter exchange in 6 weeks. Electronically Signed   By: Sandi Mariscal M.D.   On: 05/20/2021 10:43   IR NEPHROSTOMY EXCHANGE RIGHT  Result Date: 05/20/2021 INDICATION: History of cervical cancer, post placement of bilateral percutaneous nephrostomy catheters on 04/21/2021. Patient presented to the emergency department last evening with CT scan demonstrating retraction of the left-sided nephrostomy catheter with end coiled and locked within the subcutaneous tissues of the left flank with mild-to-moderate recurrent left-sided pelvicaliectasis. Additionally, the right-sided nephrostomy catheter has been retracted with end coiled and locked within  the right renal cortex with radiopaque side marker retracted outside the renal parenchyma. As such, patient now presents for fluoroscopic guided bilateral replacement/exchange. EXAM: FLUOROSCOPIC GUIDED BILATERAL SIDED NEPHROSTOMY CATHETER EXCHANGE COMPARISON:  CT abdomen pelvis-05/19/2021; 04/18/2021 Image guided bilateral percutaneous nephrostomy catheter placement-04/21/2021 CONTRAST:  A total of 10 mL Isovue-300 administered was administered into both collecting systems FLUOROSCOPY TIME:  3 minutes, 18 seconds (73.4 mGy) COMPLICATIONS: None immediate. TECHNIQUE: Informed written consent was obtained from the patient after a discussion of the risks, benefits and alternatives to treatment. Questions regarding the procedure were encouraged and answered. A timeout was performed prior to the initiation of the procedure. The bilateral flanks and external portions of existing nephrostomy catheters were prepped and draped in the usual sterile fashion. A sterile drape was applied covering the operative field. Maximum barrier sterile technique with sterile  gowns and gloves were used for the procedure. A timeout was performed prior to the initiation of the procedure. Preprocedural spot fluoroscopic image was obtained of the right flank demonstrating retraction of the right-sided nephrostomy catheter. Contrast injection demonstrated the end of the catheter remain partially within the right renal collecting system. As such, the external portion of the drainage catheter was cut and cannulated with an Amplatz wire. Under imaging fluoroscopic guidance, the nephrostomy catheter was exchanged for a short Kumpe catheter which was manipulated to the level of the superior aspect the right ureter. Contrast injection confirmed appropriate positioning. Next, over a short Amplatz wire, a new, slightly larger now 12 French percutaneous nephrostomy catheter was placed with end coiled and locked within the right renal pelvis. Attention was now paid towards the malpositioned left-sided nephrostomy catheter. Preprocedural spot fluoroscopic image demonstrates significant retraction of the left-sided nephrostomy catheter outside the expected location of the left renal collecting system. Contrast injection fortunately demonstrated a serpiginous tract to the level of the left renal collecting system. The external portion of the catheter was cut and cannulated with a regular glidewire. Under intermittent fluoroscopic guidance, the nephrostomy catheter was exchanged for a short Kumpe catheter which was manipulated through the subcutaneous track to the level of the left renal pelvis. Contrast injection confirmed appropriate positioning Next, over a short Amplatz wire, the Kumpe catheter was exchanged for a new now slightly larger 12 French nephrostomy catheter with end coiled and locked within the left renal pelvis. Both nephrostomy catheters were flushed with a small amount of saline and connected to gravity bags. Both drainage catheters were secured with two interrupted 0 Prolene sutures and  StatLock devices. Dressings were applied. The patient tolerated the above procedures well without immediate postprocedural complication. FINDINGS: The bilateral nephrostomy catheters are malpositioned with the left-sided nephrostomy catheter completely outside the left-sided renal collecting system while the right-sided nephrostomy catheter was slightly withdrawn to the edge of the accessed calyx. After successful fluoroscopic guided exchange, and repositioning, the new now slightly larger, 12 French nephrostomy catheters are both appropriately coiled and locked within the respective renal pelvises. IMPRESSION: Successful fluoroscopic guided exchange, repositioning and up sizing of now 12 French bilateral percutaneous nephrostomy catheters. PLAN: - Consideration for future antegrade ureteral stent placement may be performed at the discretion of the urologic service as indicated. - Otherwise, recommend routine fluoroscopic guided bilateral nephrostomy catheter exchange in 6 weeks. Electronically Signed   By: Sandi Mariscal M.D.   On: 05/20/2021 10:43     LOS: 2 days   Oren Binet, MD  [facility]    To contact the attending provider between 7A-7P or the covering  provider during after hours 7P-7A, please log into the web site [email or web address] and access using universal [place] password for that web site. If you do not have the password, please call the hospital operator.  05/21/2021, 2:42 PM

## 2021-05-21 NOTE — Evaluation (Signed)
Occupational Therapy Evaluation Patient Details Name: Debbie [name] MRN: [id number] DOB: 24-May-1960 Today's Date: 05/21/2021   History of Present Illness 61 yo admitted 2/4 with left nephrostomy tube dislodgement and sepsis from complicated UTI. 2/5 nephrostomy tube exchange. PMHx; obstructive uropathy with bil nephrostomy tubes, stage IV cervical CA, Afib, HTN, obesity, seizure disorder   Clinical Impression   Pt presents with decline in function and safety with ADLs and ADL mobility with impaired strength, balance and endurance. PTA pt lived at home with multiple family members and was Ind with ADLs/selfcare, no AD for mobility, sponge bathes and daughter assists with cooking and home mgt. Pt currently requires min A with LB ADLs, mi guard A with grooming/hygiene standing at sink, min guard A with toileting and min guard A with mobility HHA. Pt would benefit from acute OT services to address impairments to maximize level of function and safety      Recommendations for follow up therapy are one component of a multi-disciplinary discharge planning process, led by the attending physician.  Recommendations may be updated based on patient status, additional functional criteria and insurance authorization.   Follow Up Recommendations  No OT follow up    Assistance Recommended at Discharge Intermittent Supervision/Assistance  Patient can return home with the following A little help with bathing/dressing/bathroom;Assistance with cooking/housework;Assist for transportation;Help with stairs or ramp for entrance    Functional Status Assessment  Patient has had a recent decline in their functional status and demonstrates the ability to make significant improvements in function in a reasonable and predictable amount of time.  Equipment Recommendations  BSC/3in1;Tub/shower bench (RW)    Recommendations for Other Services       Precautions / Restrictions Precautions Precautions: Fall Precaution  Comments: bil nephrostomy tubes Restrictions Weight Bearing Restrictions: No      Mobility Bed Mobility Overal bed mobility: Needs Assistance Bed Mobility: Sit to Supine       Sit to supine: Min assist   General bed mobility comments: min A with LEs off EOB and back onto bed    Transfers Overall transfer level: Needs assistance   Transfers: Sit to/from Stand, Bed to chair/wheelchair/BSC Sit to Stand: Min guard Stand pivot transfers: Min guard         General transfer comment: Pt refused sitting in chair stating, "I hate that chair, it;s uncomfortable"      Balance Overall balance assessment: Needs assistance         Standing balance support: Single extremity supported, No upper extremity supported, During functional activity Standing balance-Leahy Scale: Fair                             ADL either performed or assessed with clinical judgement   ADL Overall ADL's : Needs assistance/impaired Eating/Feeding: Independent;Sitting;Bed level   Grooming: Wash/dry hands;Wash/dry face;Min guard;Standing   Upper Body Bathing: Set up;Sitting   Lower Body Bathing: Minimal assistance   Upper Body Dressing : Set up;Sitting   Lower Body Dressing: Minimal assistance   Toilet Transfer: Min guard;Ambulation;Cueing for safety;BSC/3in1   Toileting- Water quality scientist and Hygiene: Min guard;Sit to/from stand   Tub/ Shower Transfer: Min guard;Ambulation;Grab bars;Cueing for safety   Functional mobility during ADLs: Min guard;Cueing for safety       Vision Baseline Vision/History: 1 Wears glasses Ability to See in Adequate Light: 0 Adequate Patient Visual Report: No change from baseline       Perception     Praxis  Pertinent Vitals/Pain Pain Assessment Pain Assessment: No/denies pain     Hand Dominance Right   Extremity/Trunk Assessment Upper Extremity Assessment Upper Extremity Assessment: Generalized weakness   Lower Extremity  Assessment Lower Extremity Assessment: Defer to PT evaluation   Cervical / Trunk Assessment Cervical / Trunk Assessment: Lordotic   Communication Communication Communication: No difficulties   Cognition Arousal/Alertness: Awake/alert Behavior During Therapy: WFL for tasks assessed/performed Overall Cognitive Status: Within Functional Limits for tasks assessed                                       General Comments       Exercises     Shoulder Instructions      Home Living Family/patient expects to be discharged to:: Private residence Living Arrangements: Children;Non-relatives/Friends Available Help at Discharge: Family;Available 24 hours/day Type of Home: House Home Access: Stairs to enter [employer] of Steps: 2   Home Layout: One level     Bathroom Shower/Tub: Teacher, early years/pre: Standard         Additional Comments: pt reports 3 falls in the last year. pt lives with daughter her boyfriend and grandkids      Prior Functioning/Environment Prior Level of Function : Independent/Modified Independent             Mobility Comments: pt states she walks in the house furniture walking, doesn't walk outside ADLs Comments: Ind with bathing, dressing, toileting; daughter does the cooking and cleaning, pt sponge bathes        OT Problem List: Decreased strength;Decreased activity tolerance;Decreased knowledge of use of DME or AE;Impaired balance (sitting and/or standing)      OT Treatment/Interventions: Self-care/ADL training;Patient/family education;Therapeutic activities;DME and/or AE instruction    OT Goals(Current goals can be found in the care plan section) Acute Rehab OT Goals Patient Stated Goal: go home OT Goal Formulation: With patient Time For Goal Achievement: 06/04/21 Potential to Achieve Goals: Good ADL Goals Pt Will Perform Grooming: with supervision;standing;with set-up;with modified independence Pt  Will Perform Lower Body Bathing: with min guard assist;with supervision;with set-up Pt Will Perform Lower Body Dressing: with min guard assist;with supervision;with set-up Pt Will Transfer to Toilet: with supervision;with modified independence;ambulating Pt Will Perform Toileting - Clothing Manipulation and hygiene: with supervision;with modified independence;sit to/from stand  OT Frequency: Min 2X/week    Co-evaluation              AM-PAC OT "6 Clicks" Daily Activity     Outcome Measure Help from another person eating meals?: None Help from another person taking care of personal grooming?: A Little Help from another person toileting, which includes using toliet, bedpan, or urinal?: A Little Help from another person bathing (including washing, rinsing, drying)?: A Little Help from another person to put on and taking off regular upper body clothing?: None Help from another person to put on and taking off regular lower body clothing?: A Little 6 Click Score: 20   End of Session Equipment Utilized During Treatment: Gait belt  Activity Tolerance: Patient tolerated treatment well Patient left: in bed;with call bell/phone within reach  OT Visit Diagnosis: Other abnormalities of gait and mobility (R26.89);Muscle weakness (generalized) (M62.81)                Time: 0263-7858 OT Time Calculation (min): 22 min Charges:  OT General Charges $OT Visit: 1 Visit OT Evaluation $OT Eval Moderate Complexity: 1  Mod    Emmit Alexanders [place] 05/21/2021, 2:18 PM

## 2021-05-21 NOTE — Progress Notes (Signed)
[place] for Warfarin Indication:  Hx of DVT/PE and pAFib  No Known Allergies  Patient Measurements: Height: 5\' 2"  (157.5 cm) Weight: 129 kg (284 lb 6.3 oz) IBW/kg (Calculated) : 50.1  Vital Signs: Temp: 98.6 F (37 C) (02/06 0413) Temp Source: Oral (02/06 0413) BP: 99/53 (02/06 0413) Pulse Rate: 99 (02/06 0413)  Labs: Recent Labs    05/19/21 1807 05/20/21 0155 05/20/21 0721 05/21/21 0221 05/21/21 0832  HGB  --  10.5* 9.7* 9.2*  --   HCT  --  32.7* 29.4* 28.5*  --   PLT  --  373 343 310  --   APTT 52* 54*  --   --   --   LABPROT  --  31.6* 32.4*  --  28.0*  INR  --  3.1* 3.2*  --  2.6*  CREATININE  --  3.06* 2.90* 2.72*  --      Estimated Creatinine Clearance: 27.7 mL/min (A) (by C-G formula based on SCr of 2.72 mg/dL (H)).  Assessment: 61 yo F presents for sepsis 2/2 to UTI. Of note has nephrostomy tubes that were placed about 5-6 weeks ago. PTA warfarin for hx of DVT/PE and pAFib. Warfarin currently held in anticipation for surgery to exchange nephrostomy tubes. INR on admit 05/19/2021: 2.7. Last dose PTA 05/18/2021 AM. Pharmacy consulted to start warfarin.   Home regimen: Warfarin 10mg  daily, except 15mg  on Sundays (total weekly dose: 75 mg), per [facility] clinic visit 05/07/2021  2/6 INR 2.6 - therapeutic. INR appears to have peaked 2/5 - decreasing now. CBC ok. Drug-drug interaction noted with flagyl which may increase AC affect of warfarin.   Goal of Therapy:  INR 2-3 Monitor platelets by anticoagulation protocol: Yes   Plan:  Coumadin 7.5 mg po tonight Daily PT/INR  Sherlon Handing, PharmD, BCPS Please see amion for complete clinical pharmacist phone list 05/21/2021,9:39 AM

## 2021-05-21 NOTE — Progress Notes (Signed)
°  Transition of Care (TOC) Screening Note   Patient Details  Name: Debbie [name] Date of Birth: November 27, 1960   Transition of Care [facility]) CM/SW Contact:    Cyndi Bender, RN Phone Number: 05/21/2021, 8:52 AM    Transition of Care Department [facility]) has reviewed patient and no TOC needs have been identified at this time. We will continue to monitor patient advancement through interdisciplinary progression rounds. If new patient transition needs arise, please place a TOC consult.

## 2021-05-21 NOTE — Assessment & Plan Note (Addendum)
Mild-patient asymptomatic-doubt any clinical significance.

## 2021-05-22 LAB — CBC WITH DIFFERENTIAL/PLATELET
Abs Immature Granulocytes: 0.07 10*3/uL (ref 0.00–0.07)
Basophils Absolute: 0 10*3/uL (ref 0.0–0.1)
Basophils Relative: 1 %
Eosinophils Absolute: 0.3 10*3/uL (ref 0.0–0.5)
Eosinophils Relative: 4 %
HCT: 29.8 % — ABNORMAL LOW (ref 36.0–46.0)
Hemoglobin: 9.3 g/dL — ABNORMAL LOW (ref 12.0–15.0)
Immature Granulocytes: 1 %
Lymphocytes Relative: 20 %
Lymphs Abs: 1.8 10*3/uL (ref 0.7–4.0)
MCH: 28 pg (ref 26.0–34.0)
MCHC: 31.2 g/dL (ref 30.0–36.0)
MCV: 89.8 fL (ref 80.0–100.0)
Monocytes Absolute: 1 10*3/uL (ref 0.1–1.0)
Monocytes Relative: 12 %
Neutro Abs: 5.5 10*3/uL (ref 1.7–7.7)
Neutrophils Relative %: 62 %
Platelets: 328 10*3/uL (ref 150–400)
RBC: 3.32 MIL/uL — ABNORMAL LOW (ref 3.87–5.11)
RDW: 14.7 % (ref 11.5–15.5)
WBC: 8.7 10*3/uL (ref 4.0–10.5)
nRBC: 0 % (ref 0.0–0.2)

## 2021-05-22 LAB — BASIC METABOLIC PANEL
Anion gap: 12 (ref 5–15)
BUN: 33 mg/dL — ABNORMAL HIGH (ref 8–23)
CO2: 18 mmol/L — ABNORMAL LOW (ref 22–32)
Calcium: 8.1 mg/dL — ABNORMAL LOW (ref 8.9–10.3)
Chloride: 105 mmol/L (ref 98–111)
Creatinine, Ser: 2.08 mg/dL — ABNORMAL HIGH (ref 0.44–1.00)
GFR, Estimated: 26 mL/min — ABNORMAL LOW (ref >60)
Glucose, Bld: 86 mg/dL (ref 70–99)
Potassium: 3.2 mmol/L — ABNORMAL LOW (ref 3.5–5.1)
Sodium: 135 mmol/L (ref 135–145)

## 2021-05-22 LAB — PROTIME-INR
INR: 2.4 — ABNORMAL HIGH (ref 0.8–1.2)
Prothrombin Time: 25.8 seconds — ABNORMAL HIGH (ref 11.4–15.2)

## 2021-05-22 MED ORDER — POTASSIUM CHLORIDE CRYS ER 20 MEQ PO TBCR
40.0000 meq | EXTENDED_RELEASE_TABLET | ORAL | Status: AC
  Administered 2021-05-22 (×2): 40 meq via ORAL
  Filled 2021-05-22 (×2): qty 2

## 2021-05-22 MED ORDER — MAGNESIUM SULFATE 4 GM/100ML IV SOLN
4.0000 g | Freq: Once | INTRAVENOUS | Status: AC
  Administered 2021-05-22: 4 g via INTRAVENOUS
  Filled 2021-05-22: qty 100

## 2021-05-22 MED ORDER — SODIUM CHLORIDE 0.9 % IV SOLN
2.0000 g | INTRAVENOUS | Status: DC
  Administered 2021-05-22: 2 g via INTRAVENOUS
  Filled 2021-05-22: qty 20

## 2021-05-22 MED ORDER — WARFARIN SODIUM 5 MG PO TABS
10.0000 mg | ORAL_TABLET | Freq: Once | ORAL | Status: AC
  Administered 2021-05-22: 10 mg via ORAL
  Filled 2021-05-22: qty 2

## 2021-05-22 NOTE — Progress Notes (Signed)
OT Cancellation Note  Patient Details Name: Debbie [name] MRN: [id number] DOB: [birth year]   Cancelled Treatment:    Reason Eval/Treat Not Completed: Fatigue/lethargy limiting ability to participate;Other (comment) pt asleep upon arrival, requesting OTA return later. Will f/u as time allows for OT session.   Harley Alto., COTA/L Acute Rehabilitation Services [phone]   Precious Haws 05/22/2021, 11:42 AM

## 2021-05-22 NOTE — Progress Notes (Signed)
[place] for Warfarin Indication:  Hx of DVT/PE and pAFib  No Known Allergies  Patient Measurements: Height: 5\' 2"  (157.5 cm) Weight: 129 kg (284 lb 6.3 oz) IBW/kg (Calculated) : 50.1  Vital Signs: Temp: 97.9 F (36.6 C) (02/07 0813) Temp Source: Oral (02/07 0813) BP: 88/57 (02/07 0813) Pulse Rate: 122 (02/07 0813)  Labs: Recent Labs    05/19/21 1807 05/20/21 0155 05/20/21 0721 05/21/21 0221 05/21/21 0832 05/22/21 0034  HGB  --  10.5* 9.7* 9.2*  --  9.3*  HCT  --  32.7* 29.4* 28.5*  --  29.8*  PLT  --  373 343 310  --  328  APTT 52* 54*  --   --   --   --   LABPROT  --  31.6* 32.4*  --  28.0* 25.8*  INR  --  3.1* 3.2*  --  2.6* 2.4*  CREATININE  --  3.06* 2.90* 2.72*  --  2.08*     Estimated Creatinine Clearance: 36.2 mL/min (A) (by C-G formula based on SCr of 2.08 mg/dL (H)).  Assessment: 61 yo F presents for sepsis 2/2 to UTI. Of note has nephrostomy tubes that were placed about 5-6 weeks ago. PTA warfarin for hx of DVT/PE and pAFib. Warfarin currently held in anticipation for surgery to exchange nephrostomy tubes. INR on admit 05/19/2021: 2.7. Last dose PTA 05/18/2021 AM. Pharmacy consulted to start warfarin.   Home regimen: Warfarin 10mg  daily, except 15mg  on Sundays (total weekly dose: 75 mg), per [facility] clinic visit 05/07/2021  INR today is 2.4 - therapeutic. INR appears to have peaked 2/5 - decreasing now. CBC ok.  Goal of Therapy:  INR 2-3 Monitor platelets by anticoagulation protocol: Yes   Plan:  Coumadin 10 mg po tonight Daily PT/INR  Sherlon Handing, PharmD, BCPS Please see amion for complete clinical pharmacist phone list 05/22/2021,8:20 AM

## 2021-05-22 NOTE — Progress Notes (Signed)
PROGRESS NOTE        PATIENT DETAILS Name: Debbie [name] Age: 61 y.o. Sex: female Date of Birth: 1960/08/16 Admit Date: 05/19/2021 Admitting Physician No admitting provider for patient encounter. [id number], [facility]  Brief Summary: Patient is a 61 y.o.  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  See below for further details.  Significant Hospital events: 1/4-1/15>> AKI due to obstructive hydronephrosis due to stage IV cervical cancer-bilateral nephrostomy tube placed 2/4>> left nephrostomy tube dislodgment of superficial tissue-presenting to [facility] with sepsis complicated UTI  Significant imaging studies: 2/4>> CT abdomen/pelvis: Left nephrostomy tube retracted into superficial tissue, right tube appears to be in the inferior pole of the cortex of the right kidney.  Significant microbiology data: 2/4>> blood culture: No growth 2/4>> urine culture: Multiple species 2/4>>Flu/COVID PCR: Negative  Procedures: 2/5>> fluoroscopic guided exchange of bilateral nephrostomy tubes.  Consults:  IR, urology  Subjective: Sitting up in chair-feels much better.  No nausea or vomiting.  Objective: Vitals: Blood pressure (!) 87/61, pulse 94, temperature 97.6 F (36.4 C), temperature source Oral, resp. rate 19, height 5\' 2"  (1.575 m), weight 129 kg, SpO2 100 %.   Exam: Gen Exam:Alert awake-not in any distress HEENT:atraumatic, normocephalic Chest: B/L clear to auscultation anteriorly CVS:S1S2 regular Abdomen:soft non tender, non distended Extremities:no edema Neurology: Non focal Skin: no rash   Pertinent Labs/Radiology: CBC  Latest Ref Rng & Units 05/22/2021 05/21/2021 05/20/2021  WBC 4.0 - 10.5 K/uL 8.7 14.1(H) 20.3(H)  Hemoglobin 12.0 - 15.0 g/dL 9.3(L) 9.2(L) 9.7(L)  Hematocrit 36.0 - 46.0 % 29.8(L) 28.5(L) 29.4(L)  Platelets 150 - 400 K/uL 328 310 343    Lab Results  Component Value Date   NA 135 05/22/2021   K 3.2 (L) 05/22/2021   CL 105 05/22/2021   CO2 18 (L) 05/22/2021      Assessment/Plan: * Severe sepsis due to complicated UTI in patient with bilateral nephrostomy- (present on admission) Sepsis physiology has improved, leukocytosis continues to downtrend-unfortunately urine cultures with multiple species-we will plan on empiric antibiotics for at least 7 days, continue IV Rocephin for another day before considering transitioning to oral antimicrobial therapy.  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  Creatinine slowly improving with treatment of sepsis-and exchange of nephrostomy tubes.  Avoid nephrotoxic agents-recheck electrolytes in a.m.  Hypokalemia/hypomagnesemia- (present on admission) Replete and recheck.  Hyponatremia- (present on admission) Mild-patient asymptomatic-doubt any clinical significance.  HTN (hypertension)- (present on admission) BP soft but stable-holding atenolol.  Paroxysmal atrial fibrillation ([place])- (present on admission) Rate controlled-INR therapeutic-continue to hold atenolol-allow BP to improve further before resuming.  History of pulmonary embolism- (present on admission) INR therapeutic-pharmacy following.  Seizure disorder ([place]) Continue home phenobarbital  Morbid obesity ([place])- (present on admission) Encourage lifestyle change to lose weight  Malignant neoplasm of cervix ([place])- (present on  admission) Followed by GYN oncology-plan is for radiation.  Per family-needs a CT chest for staging-we will plan on a noncontrast CT chest over the next few days.  Bilateral hydronephrosis with bilateral nephrostomy S/p exchange of bilateral nephrostomy tubes-as left nephrostomy tube had been retracted and was in the soft tissue.   Code status:   Code Status: DNR   DVT Prophylaxis: Therapeutic INR-on Coumadin.  warfarin (COUMADIN) tablet 10 mg   Family Communication:Daughter-Kelly-[phone]-updated over the phone on 2/7   Disposition Plan: Status is: Inpatient Remains inpatient appropriate because: Sepsis-due to UTI--rapidly improving-awaiting further improvement in renal function-potential discharge on 2/8    Planned Discharge Destination:Home   Diet: Diet Order             Diet regular Room service appropriate? Yes; Fluid consistency: Thin  Diet effective now                     Antimicrobial agents: Anti-infectives (From admission, onward)    Start     Dose/Rate Route Frequency Ordered Stop   05/22/21 2000  cefTRIAXone (ROCEPHIN) 2 g in sodium chloride 0.9 % 100 mL IVPB        2 g 200 mL/hr over 30 Minutes Intravenous Every 24 hours 05/22/21 0716 05/26/21 1959   05/21/21 2000  cefTRIAXone (ROCEPHIN) 1 g in sodium chloride 0.9 % 100 mL IVPB  Status:  Discontinued        1 g 200 mL/hr over 30 Minutes Intravenous Every 24 hours 05/21/21 1436 05/22/21 0716   05/21/21 1545  vancomycin (VANCOREADY) IVPB 1750 mg/350 mL  Status:  Discontinued       See Hyperspace for full Linked Orders Report.   1,750 mg 175 mL/hr over 120 Minutes Intravenous Every 48 hours 05/19/21 1540 05/20/21 1143   05/21/21 1545  vancomycin (VANCOCIN) IVPB 1000 mg/200 mL premix  Status:  Discontinued       See Hyperspace for full Linked Orders Report.   1,000 mg 200 mL/hr over 60 Minutes Intravenous Every 48 hours 05/20/21 1143 05/20/21 1214   05/20/21 1545  vancomycin (VANCOREADY) IVPB  1250 mg/250 mL  Status:  Discontinued       See Hyperspace for full Linked Orders Report.   1,250 mg 166.7 mL/hr over 90 Minutes Intravenous Every 24 hours 05/19/21 1536 05/19/21 1540   05/19/21 2200  ceFEPIme (MAXIPIME) 2 g in sodium chloride 0.9 % 100 mL IVPB  Status:  Discontinued        2 g 200 mL/hr over 30 Minutes Intravenous Every 24 hours 05/19/21 1726 05/21/21 1436   05/19/21 1800  metroNIDAZOLE (FLAGYL) IVPB 500 mg  Status:  Discontinued        500 mg 100 mL/hr over 60 Minutes Intravenous Every 8 hours 05/19/21 1733 05/20/21 1214   05/19/21 1730  ceFEPIme (MAXIPIME) 2 g in sodium chloride 0.9 % 100 mL IVPB  Status:  Discontinued        2 g 200 mL/hr over 30 Minutes Intravenous  Once 05/19/21 1716 05/19/21 1725   05/19/21 1730  metroNIDAZOLE (FLAGYL) IVPB 500 mg  Status:  Discontinued        500 mg 100 mL/hr over 60 Minutes Intravenous Every 12 hours 05/19/21 1716 05/19/21 1733   05/19/21 1545  vancomycin (VANCOREADY) IVPB 2000 mg/400 mL  Status:  Discontinued       See Hyperspace for full Linked Orders Report.   2,000 mg 200 mL/hr over 120 Minutes Intravenous  Once 05/19/21 1536 05/19/21 1540   05/19/21 1545  vancomycin (VANCOREADY) IVPB  2000 mg/400 mL       See Hyperspace for full Linked Orders Report.   2,000 mg 200 mL/hr over 120 Minutes Intravenous  Once 05/19/21 1540 05/19/21 1859   05/19/21 1530  piperacillin-tazobactam (ZOSYN) IVPB 3.375 g        3.375 g 100 mL/hr over 30 Minutes Intravenous  Once 05/19/21 1522 05/19/21 1621        MEDICATIONS: Scheduled Meds:  PHENobarbital  97.2 mg Oral BID   warfarin  10 mg Oral ONCE-1600   Warfarin - Pharmacist Dosing Inpatient   Does not apply q1600   Continuous Infusions:  cefTRIAXone (ROCEPHIN)  IV     PRN Meds:.acetaminophen **OR** acetaminophen, HYDROmorphone (DILAUDID) injection, loperamide, ondansetron **OR** ondansetron (ZOFRAN) IV, oxyCODONE, [EXPIRED] polyethylene glycol **FOLLOWED BY** polyethylene glycol,  [EXPIRED] senna-docusate **FOLLOWED BY** senna-docusate   I have personally reviewed following labs and imaging studies  LABORATORY DATA: CBC: Recent Labs  Lab 05/17/21 1320 05/19/21 1437 05/20/21 0155 05/20/21 0721 05/21/21 0221 05/22/21 0034  WBC 15.0* 19.8* 21.1* 20.3* 14.1* 8.7  NEUTROABS 11.3* 16.4*  --   --   --  5.5  HGB 11.4* 11.2* 10.5* 9.7* 9.2* 9.3*  HCT 34.4* 34.3* 32.7* 29.4* 28.5* 29.8*  MCV 86.2 89.8 89.3 87.8 89.1 89.8  PLT 525* 482* 373 343 310 328     Basic Metabolic Panel: Recent Labs  Lab 05/17/21 1320 05/19/21 1454 05/20/21 0155 05/20/21 0721 05/21/21 0221 05/22/21 0034  NA 134* 133* 134* 134* 129* 135  K 3.3* 3.1* 3.7 3.3* 3.0* 3.2*  CL 99 99 101 106 101 105  CO2 23 23 19* 18* 17* 18*  GLUCOSE 120* 149* 102* 103* 87 86  BUN 23 33* 34* 34* 33* 33*  CREATININE 2.08* 3.08* 3.06* 2.90* 2.72* 2.08*  CALCIUM 9.2 8.4* 8.3* 7.9* 7.8* 8.1*  MG 1.2*  --  1.1* 1.0* 1.1*  --   PHOS  --   --  2.7  --   --   --      GFR: Estimated Creatinine Clearance: 36.2 mL/min (A) (by C-G formula based on SCr of 2.08 mg/dL (H)).  Liver Function Tests: Recent Labs  Lab 05/17/21 1320 05/19/21 1454 05/20/21 0155 05/20/21 0721 05/21/21 0221  AST 47* 54* 55* 47* 42*  ALT 30 31 28 24 23   ALKPHOS 115 90 72 74 67  BILITOT 0.5 0.8 0.9 0.9 0.6  PROT 7.9 7.4 6.7 6.1* 6.1*  ALBUMIN 3.3* 2.6* 2.2* 1.9* 1.8*    No results for input(s): LIPASE, AMYLASE in the last 168 hours. No results for input(s): AMMONIA in the last 168 hours.  Coagulation Profile: Recent Labs  Lab 05/19/21 1637 05/20/21 0155 05/20/21 0721 05/21/21 0832 05/22/21 0034  INR 2.7* 3.1* 3.2* 2.6* 2.4*     Cardiac Enzymes: No results for input(s): CKTOTAL, CKMB, CKMBINDEX, TROPONINI in the last 168 hours.  BNP (last 3 results) No results for input(s): PROBNP in the last 8760 hours.  Lipid Profile: No results for input(s): CHOL, HDL, LDLCALC, TRIG, CHOLHDL, LDLDIRECT in the last 72  hours.  Thyroid Function Tests: No results for input(s): TSH, T4TOTAL, FREET4, T3FREE, THYROIDAB in the last 72 hours.  Anemia Panel: No results for input(s): VITAMINB12, FOLATE, FERRITIN, TIBC, IRON, RETICCTPCT in the last 72 hours.  Urine analysis:    Component Value Date/Time   COLORURINE YELLOW 05/19/2021 1711   APPEARANCEUR CLEAR 05/19/2021 1711   APPEARANCEUR Hazy (A) 05/04/2021 1421   LABSPEC 1.015 05/19/2021 1711   PHURINE 6.0 05/19/2021  [place] 05/19/2021 1711   HGBUR LARGE (A) 05/19/2021 1711   BILIRUBINUR NEGATIVE 05/19/2021 1711   BILIRUBINUR Negative 05/04/2021 [place] 05/19/2021 1711   PROTEINUR 100 (A) 05/19/2021 1711   NITRITE POSITIVE (A) 05/19/2021 1711   LEUKOCYTESUR LARGE (A) 05/19/2021 1711    Sepsis Labs: Lactic Acid, Venous    Component Value Date/Time   LATICACIDVEN 1.5 05/19/2021 2340    MICROBIOLOGY: Recent Results (from the past 240 hour(s))  Blood culture (routine x 2)     Status: None (Preliminary result)   Collection Time: 05/19/21  2:49 PM   Specimen: Left Antecubital; Blood  Result Value Ref Range Status   Specimen Description   Final    LEFT ANTECUBITAL BOTTLES DRAWN AEROBIC AND ANAEROBIC   Special Requests Blood Culture adequate volume  Final   Culture   Final    NO GROWTH 3 DAYS Performed at [facility], [address], [place], [place] [address]    Report Status PENDING  Incomplete  Blood culture (routine x 2)     Status: None (Preliminary result)   Collection Time: 05/19/21  2:54 PM   Specimen: BLOOD  Result Value Ref Range Status   Specimen Description BLOOD  Final   Special Requests BLOOD  Final   Culture   Final    NO GROWTH 3 DAYS Performed at [facility], [address], [place], [place] [address]    Report Status PENDING  Incomplete  Urine Culture     Status: Abnormal   Collection Time: 05/19/21  5:11 PM   Specimen: Urine, Catheterized  Result Value Ref Range Status   Specimen  Description   Final    URINE, CATHETERIZED Performed at [facility], [address], [place], [place] [address]    Special Requests   Final    NONE Performed at [facility], [address], [place], [place] [address]    Culture MULTIPLE SPECIES PRESENT, SUGGEST RECOLLECTION (A)  Final   Report Status 05/21/2021 FINAL  Final  Resp Panel by RT-PCR (Flu A&B, Covid) Nasopharyngeal Swab     Status: None   Collection Time: 05/19/21  7:43 PM   Specimen: Nasopharyngeal Swab; Nasopharyngeal(NP) swabs in vial transport medium  Result Value Ref Range Status   SARS Coronavirus 2 by RT PCR NEGATIVE NEGATIVE Final    Comment: (NOTE) SARS-CoV-2 target nucleic acids are NOT DETECTED.  The SARS-CoV-2 RNA is generally detectable in upper respiratory specimens during the acute phase of infection. The lowest concentration of SARS-CoV-2 viral copies this assay can detect is 138 copies/mL. A negative result does not preclude SARS-Cov-2 infection and should not be used as the sole basis for treatment or other patient management decisions. A negative result may occur with  improper specimen collection/handling, submission of specimen other than nasopharyngeal swab, presence of viral mutation(s) within the areas targeted by this assay, and inadequate number of viral copies(<138 copies/mL). A negative result must be combined with clinical observations, patient history, and epidemiological information. The expected result is Negative.  Fact Sheet for Patients:  EntrepreneurPulse.com.au  Fact Sheet for Healthcare Providers:  IncredibleEmployment.be  This test is no t yet approved or cleared by the [place] FDA and  has been authorized for detection and/or diagnosis of SARS-CoV-2 by FDA under an Emergency Use Authorization (EUA). This EUA will remain  in effect (meaning this test can be used) for the duration of the COVID-19 declaration under Section 564(b)(1) of  the Act, 21  U.S.C.section 360bbb-3(b)(1), unless the authorization is terminated  or revoked sooner.       Influenza A by PCR NEGATIVE NEGATIVE Final   Influenza B by PCR NEGATIVE NEGATIVE Final    Comment: (NOTE) The Xpert Xpress SARS-CoV-2/FLU/RSV plus assay is intended as an aid in the diagnosis of influenza from Nasopharyngeal swab specimens and should not be used as a sole basis for treatment. Nasal washings and aspirates are unacceptable for Xpert Xpress SARS-CoV-2/FLU/RSV testing.  Fact Sheet for Patients: EntrepreneurPulse.com.au  Fact Sheet for Healthcare Providers: IncredibleEmployment.be  This test is not yet approved or cleared by the [place] FDA and has been authorized for detection and/or diagnosis of SARS-CoV-2 by FDA under an Emergency Use Authorization (EUA). This EUA will remain in effect (meaning this test can be used) for the duration of the COVID-19 declaration under Section 564(b)(1) of the Act, 21 U.S.C. section 360bbb-3(b)(1), unless the authorization is terminated or revoked.  Performed at [facility], [address] [place], [place] [address]     RADIOLOGY STUDIES/RESULTS: No results found.   LOS: 3 days   Oren Binet, MD  [facility]    To contact the attending provider between 7A-7P or the covering provider during after hours 7P-7A, please log into the web site [email or web address] and access using universal [place] password for that web site. If you do not have the password, please call the hospital operator.  05/22/2021, 12:18 PM

## 2021-05-22 NOTE — Progress Notes (Signed)
Occupational Therapy Treatment Patient Details Name: Debbie [name] MRN: [id number] DOB: [birth year] Today's Date: 05/22/2021   History of present illness 61 yo admitted 2/4 with left nephrostomy tube dislodgement and sepsis from complicated UTI. 2/5 nephrostomy tube exchange. PMHx; obstructive uropathy with bil nephrostomy tubes, stage IV cervical CA, Afib, HTN, obesity, seizure disorder   OT comments  Pt making excellent progress towards OT goals this session. . Session focus on functional mobility, BADL reeducation, increasing overall activity tolerance and decreasing caregiver burden. Pt greeted in recliner finishing lunch and agreeable to OT session. Pt currently requires Min guard assist for functional ambulation with no AD with assistance needed only for drain mgmt and line mgmt. Pt stood at sink for ADLs with supervision. Pts daughter called during session stating that she feels her mother needs SNF, explained that pt is moving very well and OT is not recommending any f/u at this time, but encouraged daughter to discuss these concerns with her mother and the family. Daughters main concern seems to be that she wont have a lot of help at home, however going to rehab will not fix lack of supervision. Alerted RN and CM to daughters concerns for f/u. At this time, DC plan remains appropriate. Will follow acutely for OT needs.      Recommendations for follow up therapy are one component of a multi-disciplinary discharge planning process, led by the attending physician.  Recommendations may be updated based on patient status, additional functional criteria and insurance authorization.    Follow Up Recommendations  No OT follow up    Assistance Recommended at Discharge Intermittent Supervision/Assistance  Patient can return home with the following  A little help with bathing/dressing/bathroom;Assistance with cooking/housework;Assist for transportation;Help with stairs or ramp for entrance    Equipment Recommendations  BSC/3in1;Tub/shower bench;Other (comment) (RW)    Recommendations for Other Services      Precautions / Restrictions Precautions Precautions: Fall Precaution Comments: bil nephrostomy tubes Restrictions Weight Bearing Restrictions: No       Mobility Bed Mobility Overal bed mobility: Needs Assistance Bed Mobility: Sit to Supine       Sit to supine: Min guard   General bed mobility comments: minguard for line mgmt, pt able to pull self up in bed    Transfers Overall transfer level: Needs assistance Equipment used: None Transfers: Sit to/from Stand, Bed to chair/wheelchair/BSC Sit to Stand: Min guard Stand pivot transfers: Min guard         General transfer comment: min guard for safety mostly for line mgmt and drain mgmt     Balance Overall balance assessment: Needs assistance Sitting-balance support: Feet supported, No upper extremity supported Sitting balance-Leahy Scale: Fair     Standing balance support: No upper extremity supported, During functional activity Standing balance-Leahy Scale: Fair Standing balance comment: standing at sink with no UE support during ADLs                           ADL either performed or assessed with clinical judgement   ADL Overall ADL's : Needs assistance/impaired     Grooming: Oral care;Standing;Supervision/safety               Lower Body Dressing: Maximal assistance;Bed level Lower Body Dressing Details (indicate cue type and reason): to don socks from bed level Toilet Transfer: Min guard;Ambulation Toilet Transfer Details (indicate cue type and reason): simulated via functional mobility         Functional mobility  during ADLs: Min guard General ADL Comments: pt completed functional mobility in room and standing ADLS with min guard only for safety and line mgmt    Extremity/Trunk Assessment Upper Extremity Assessment Upper Extremity Assessment: Generalized weakness    Lower Extremity Assessment Lower Extremity Assessment: Defer to PT evaluation   Cervical / Trunk Assessment Cervical / Trunk Assessment: Lordotic    Vision Baseline Vision/History: 1 Wears glasses Ability to See in Adequate Light: 0 Adequate Patient Visual Report: No change from baseline     Perception Perception Perception: Within Functional Limits   Praxis Praxis Praxis: Intact    Cognition Arousal/Alertness: Awake/alert Behavior During Therapy: WFL for tasks assessed/performed Overall Cognitive Status: Within Functional Limits for tasks assessed                                          Exercises      Shoulder Instructions       General Comments son in law present during session, pt in Afib pattern during session, education provided on how to use strap to manage drains at home    Pertinent Vitals/ Pain       Pain Assessment Pain Assessment: No/denies pain  Home Living                                          Prior Functioning/Environment              Frequency  Min 2X/week        Progress Toward Goals  OT Goals(current goals can now be found in the care plan section)  Progress towards OT goals: Progressing toward goals  Acute Rehab OT Goals Patient Stated Goal: go home OT Goal Formulation: With patient Time For Goal Achievement: 06/04/21 Potential to Achieve Goals: Good  Plan Discharge plan remains appropriate;Frequency remains appropriate    Co-evaluation                 AM-PAC OT "6 Clicks" Daily Activity     Outcome Measure   Help from another person eating meals?: None Help from another person taking care of personal grooming?: None Help from another person toileting, which includes using toliet, bedpan, or urinal?: A Little Help from another person bathing (including washing, rinsing, drying)?: A Little Help from another person to put on and taking off regular upper body clothing?:  None Help from another person to put on and taking off regular lower body clothing?: A Lot 6 Click Score: 20    End of Session    OT Visit Diagnosis: Other abnormalities of gait and mobility (R26.89);Muscle weakness (generalized) (M62.81)   Activity Tolerance Patient tolerated treatment well   Patient Left in bed;with call bell/phone within reach;with bed alarm set;with family/visitor present   Nurse Communication Mobility status;Other (comment)        Time: 1330-1350 OT Time Calculation (min): 20 min  Charges: OT General Charges $OT Visit: 1 Visit OT Treatments $Self Care/Home Management : 8-22 mins  Harley [place]., COTA/L Acute Rehabilitation Services [phone]   Precious Haws 05/22/2021, 2:06 PM

## 2021-05-22 NOTE — Plan of Care (Signed)
  Problem: Education: Goal: Knowledge of General Education information will improve Description Including pain rating scale, medication(s)/side effects and non-pharmacologic comfort measures Outcome: Progressing   Problem: Health Behavior/Discharge Planning: Goal: Ability to manage health-related needs will improve Outcome: Progressing   

## 2021-05-22 NOTE — TOC Initial Note (Addendum)
Transition of Care [facility]) - Initial/Assessment Note    Patient Details  Name: Debbie [name] MRN: [id number] Date of Birth: 05-05-1960  Transition of Care [facility]) CM/SW Contact:    Carles Collet, RN Phone Number: 05/22/2021, 2:08 PM  Clinical Narrative:        Damaris Schooner w patient at bedside. She states that she lives at home w family.  We discussed inability to find participating [facility] agency for payor source. [place] services cannot be set up. She is agreeable to outpatient PT  at [facility], CM will make referral. She states that transportation will not be a problem to get to appointments. Will place referral in Epic today Discussed DME and she would like a bari 3/1 and normal sized RW (concerned about RW size to get through [facility].  Will order through [employer] today. Attempt to reach daughter x2 unsuccessful, LVM.   Update- 2/8 Spoke w daughter and she is agreeable to DC plan           Expected Discharge Plan: Home/Self Care Barriers to Discharge: Continued Medical Work up   Patient Goals and CMS Choice Patient states their goals for this hospitalization and ongoing recovery are:: to go home CMS Medicare.gov Compare Post Acute Care list provided to:: Patient    Expected Discharge Plan and Services Expected Discharge Plan: Home/Self Care   Discharge Planning Services: CM Consult Post Acute Care Choice: Durable Medical Equipment                   DME Arranged: 3-N-1, Walker rolling DME Agency: [employer] Date DME Agency Contacted: 05/22/21 Time DME Agency Contacted: [id number] Representative spoke with at DME Agency: Freda Munro            Prior Living Arrangements/Services   Lives with:: Relatives              Current home services: DME    Activities of Daily Living Home Assistive Devices/Equipment: Eyeglasses ADL Screening (condition at time of admission) Patient's cognitive ability adequate to safely complete daily activities?: Yes Is the patient deaf or have difficulty  hearing?: No Does the patient have difficulty seeing, even when wearing glasses/contacts?: Yes Does the patient have difficulty concentrating, remembering, or making decisions?: No Patient able to express need for assistance with ADLs?: Yes Does the patient have difficulty dressing or bathing?: No Independently performs ADLs?: Yes (appropriate for developmental age) Does the patient have difficulty walking or climbing stairs?: No Weakness of Legs: Both Weakness of Arms/Hands: None  Permission Sought/Granted                  Emotional Assessment              Admission diagnosis:  Displacement of nephrostomy tube ([place]) [T83.022A] Severe sepsis ([place]) [A41.9, R65.20] Sepsis without acute organ dysfunction, due to unspecified organism [facility]) [A41.9] Patient Active Problem List   Diagnosis Date Noted   Severe sepsis due to complicated UTI in patient with bilateral nephrostomy 05/19/2021   Bilateral hydronephrosis with bilateral nephrostomy 05/19/2021   Bandemia 05/19/2021   History of DVT (deep vein thrombosis) 05/19/2021   Lactic acidosis 05/19/2021   Hyponatremia 05/19/2021   Chronic kidney disease (CKD), stage IV (severe) ([place]) 05/17/2021   Hypotension 05/17/2021   Hypomagnesemia 05/17/2021   Malignant neoplasm of cervix ([place]) 05/10/2021   AKI/azotemia on CKD-4 04/18/2021   Encounter for therapeutic drug monitoring 10/12/2018   Morbid obesity ([place]) 09/02/2018   History of pulmonary embolism 08/11/2018   Acute deep vein  thrombosis (DVT) of right lower extremity ([place]) 08/11/2018   Hypokalemia/hypomagnesemia 08/11/2018   Paroxysmal atrial fibrillation ([place]) 08/11/2018   HTN (hypertension) 08/11/2018   Seizure disorder ([place]) 08/11/2018   PCP:  Sandria Manly [place]:   [facility] [address], [place] [address] Phone: [phone] Fax: [place], [place] [address] Phone: [phone] Fax: [phone]     Social Determinants of Health (SDOH) Interventions    Readmission Risk Interventions No flowsheet data found.

## 2021-05-23 ENCOUNTER — Other Ambulatory Visit (HOSPITAL_COMMUNITY): Payer: Medicaid Other

## 2021-05-23 LAB — BASIC METABOLIC PANEL
Anion gap: 11 (ref 5–15)
BUN: 26 mg/dL — ABNORMAL HIGH (ref 8–23)
CO2: 19 mmol/L — ABNORMAL LOW (ref 22–32)
Calcium: 8.4 mg/dL — ABNORMAL LOW (ref 8.9–10.3)
Chloride: 104 mmol/L (ref 98–111)
Creatinine, Ser: 1.88 mg/dL — ABNORMAL HIGH (ref 0.44–1.00)
GFR, Estimated: 30 mL/min — ABNORMAL LOW (ref >60)
Glucose, Bld: 97 mg/dL (ref 70–99)
Potassium: 4 mmol/L (ref 3.5–5.1)
Sodium: 134 mmol/L — ABNORMAL LOW (ref 135–145)

## 2021-05-23 LAB — MAGNESIUM: Magnesium: 1.8 mg/dL (ref 1.7–2.4)

## 2021-05-23 LAB — PROTIME-INR
INR: 2.6 — ABNORMAL HIGH (ref 0.8–1.2)
Prothrombin Time: 27.8 seconds — ABNORMAL HIGH (ref 11.4–15.2)

## 2021-05-23 MED ORDER — ATENOLOL 50 MG PO TABS: 50.0000 mg | ORAL_TABLET | Freq: Every day | ORAL | Status: DC

## 2021-05-23 MED ORDER — CEFDINIR 300 MG PO CAPS
300.0000 mg | ORAL_CAPSULE | Freq: Two times a day (BID) | ORAL | 0 refills | Status: AC
Start: 2021-05-23 — End: 2021-05-26

## 2021-05-23 NOTE — TOC Transition Note (Addendum)
Transition of Care [facility]) - CM/SW Discharge Note   Patient Details  Name: Debbie [name] MRN: [id number] Date of Birth: 1960-06-08  [facility]) CM/SW Contact:  Cyndi Bender, RN Phone Number: 05/23/2021, 11:09 AM   Clinical Narrative:    Patient stable for discharge. Outpt PT referral sent yesterday.  DME ordered yesterday. Walker in room. 3&1 will be shipped to the house. Patient is aware Sister will transport patient home.  No other TOC needs.     Final next level of care: OP Rehab Barriers to Discharge: Barriers Resolved   Patient Goals and CMS Choice Patient states their goals for this hospitalization and ongoing recovery are:: to go home CMS Medicare.gov Compare Post Acute Care list provided to:: Patient    Discharge Placement                 home      Discharge Plan and Services   Discharge Planning Services: CM Consult Post Acute Care Choice: Durable Medical Equipment          DME Arranged: 3-N-1, Walker rolling DME Agency: [employer] Date DME Agency Contacted: 05/22/21 Time DME Agency Contacted: [phone] Representative spoke with at DME Agency: [place] Interventions     Readmission Risk Interventions Readmission Risk Prevention Plan 05/23/2021  Transportation Screening Complete  PCP or Specialist Appt within 3-5 Days Complete  HRI or Home Care Consult Complete  Palliative Care Screening Not Applicable  Medication Review (RN Care Manager) Complete  Some recent data might be hidden

## 2021-05-23 NOTE — Discharge Summary (Signed)
PATIENT DETAILS Name: Debbie [name] Age: 61 y.o. Sex: female Date of Birth: May 04, 1960 MRN: [id number]. Admitting Physician: No admitting provider for patient encounter. [id number], [place] [facility]  Admit Date: 09/15/3352 Discharge date: 05/23/2021  Recommendations for Outpatient Follow-up:  Follow up with PCP in 1-2 weeks Please obtain CMP/CBC in one week Please ensure follow-up with oncology  Admitted From:  Home  Disposition: Outpatient PT   Discharge Condition: good  CODE STATUS:   Code Status: DNR   Diet recommendation:  Diet Order             Diet - low sodium heart healthy           Diet regular Room service appropriate? Yes; Fluid consistency: Thin  Diet effective now                    Brief Summary: Patient is a 61 y.o.  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  See below for further details.  Significant Hospital events: 1/4-1/15>> AKI due to obstructive hydronephrosis due to stage IV cervical cancer-bilateral nephrostomy tube placed 2/4>> left nephrostomy tube dislodgment of superficial tissue-presenting to [facility] with sepsis complicated UTI  Significant imaging studies: 2/4>> CT abdomen/pelvis: Left nephrostomy tube retracted into superficial tissue, right tube appears to be in the inferior pole of the cortex of the right kidney.  Significant microbiology data: 2/4>> blood culture: No growth 2/4>> urine culture: Multiple species 2/4>>Flu/COVID PCR: Negative  Procedures: 2/5>> fluoroscopic guided exchange of bilateral nephrostomy tubes.  Consults:  IR, urology  Brief Hospital Course: * Severe sepsis due to  complicated UTI in patient with bilateral nephrostomy- (present on admission) Sepsis physiology has improved, leukocytosis continues to downtrend-unfortunately urine cultures with multiple species-we will plan on empiric antibiotics for at least 7 days, initially was on cefepime-then transitioned to Rocephin for a few days-will plan to transition to oral antibiotics to complete a 7-day course of treatment.  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  Creatinine slowly improving with treatment of sepsis-and exchange of nephrostomy tubes.  Suspect that her creatinine might continue to improve-and her underlying CKD levels may actually change-please repeat electrolytes in a week or so  Hypokalemia/hypomagnesemia- (present on admission) Replete and recheck.  Hyponatremia- (present on admission) Mild-patient asymptomatic-doubt any clinical significance.  HTN (hypertension)- (present on admission) BP initially soft but now increasing-resuming atenolol.  Follow-up with PCP for further optimization.  Paroxysmal atrial fibrillation ([place])- (present on admission) Rate controlled-INR therapeutic-BP now creeping up-hence atenolol will be resumed.  History of pulmonary embolism- (present on admission) INR therapeutic-pharmacy following.  Seizure disorder ([place]) Continue home phenobarbital  Morbid obesity ([place])- (present on admission) Encourage lifestyle change to lose weight  Malignant neoplasm of cervix ([place])- (present on admission) Followed by GYN oncology-plan is for radiation.  Per family-needs a CT chest for staging-initially contemplated doing a noncontrast CT chest-however unction continues to improve-creatinine down to 1.88-this is the lowest  it has been since her onset of acute illness/obstructive uropathy.  Discussed with  her daughter-Kelly-over the phone-suspect we should hold off on a CT chest-repeat electrolytes in the next 1-2 weeks to see if kidney function has improved further-that way contrasted CT chest can be performed if her creatinine permits.  Bilateral hydronephrosis with bilateral nephrostomy S/p exchange of bilateral nephrostomy tubes-as left nephrostomy tube had been retracted and was in the soft tissue.  Morbid Obesity: Estimated body mass index is 52.02 kg/m as calculated from the following:   Height as of this encounter: 5\' 2"  (1.575 m).   Weight as of this encounter: 129 kg.   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   Hypokalemia/hypomagnesemia   Hyponatremia   Paroxysmal atrial fibrillation (H[facility])   HTN (hypertension)   History of pulmonary embolism   Seizure disorder (HCC)   Morbid obesity (HCC)   Malignant neoplasm of cervix (H[facility])   Bilateral hydronephrosis with bilateral nephrostomy   Hypotension   Bandemia   History of DVT (deep vein thrombosis)   Lactic acidosis   Discharge Instructions:  Activity:  As tolerated with Full fall precautions use walker/cane & assistance as needed  Discharge Instructions     Ambulatory referral to Physical Therapy   Complete by: As directed    Pt with decreased mobility, strength and function with pt declining ambulation at this time. Pt lives at home with daughter and her boyfriend and reports she has assist throughout the day from them if needed. Pt does not walk outside the house and reports having to hold onto furniture for stability with activity. Pt will benefit from acute therapy to maximize mobility, safety and function to decrease burden of care. Encouraged OOB daily with nursing and walking to bathroom for toileting.   Call MD for:   persistant nausea and vomiting   Complete by: As directed    Call MD for:  temperature >100.4   Complete by: As directed    Diet - low sodium heart healthy   Complete by: As directed    Discharge instructions   Complete by: As directed    Follow with Primary MD  [facility], [facility] in 1-2 weeks  Please get a complete blood count and chemistry panel checked by your Primary MD at your next visit, and again as instructed by your Primary MD.  Get Medicines reviewed and adjusted: Please take all your medications with you for your next visit with your Primary MD  Laboratory/radiological data: Please request your Primary MD to go over all hospital tests and procedure/radiological results at the follow up, please ask your Primary MD to get all Hospital records sent to his/her office.  In some cases, they will be blood work, cultures and biopsy results pending at the time of your discharge. Please request that your primary care M.D. follows up on these results.  Also Note the following: If you experience worsening of your admission symptoms, develop shortness of breath, life threatening emergency, suicidal or homicidal thoughts you must seek medical attention immediately by calling 911 or calling your MD immediately  if symptoms less severe.  You must read complete instructions/literature along with all the possible adverse reactions/side effects for all the Medicines you take and that have been prescribed to you. Take any new Medicines after you have completely understood and accpet all the possible adverse reactions/side effects.   Do not drive when taking Pain medications or sleeping medications (Benzodaizepines)  Do not take  more than prescribed Pain, Sleep and Anxiety Medications. It is not advisable to combine anxiety,sleep and pain medications without talking with your primary care practitioner  Special Instructions: If you have smoked or chewed Tobacco  in the last 2 yrs  please stop smoking, stop any regular Alcohol  and or any Recreational drug use.  Wear Seat belts while driving.  Please note: You were cared for by a hospitalist during your hospital stay. Once you are discharged, your primary care physician will handle any further medical issues. Please note that NO REFILLS for any discharge medications will be authorized once you are discharged, as it is imperative that you return to your primary care physician (or establish a relationship with a primary care physician if you do not have one) for your post hospital discharge needs so that they can reassess your need for medications and monitor your lab values.   Follow-up with oncology as previously scheduled  Ask your primary care practitioner/oncology to repeat chemistry panel in 1-2 weeks to see if your renal function will not permit doing a CT chest with contrast   Increase activity slowly   Complete by: As directed    No wound care   Complete by: As directed       Allergies as of 05/23/2021   No Known Allergies      Medication List     TAKE these medications    acetaminophen 325 MG tablet Commonly known as: TYLENOL Take 2 tablets (650 mg total) by mouth every 6 (six) hours as needed for mild pain or moderate pain.   atenolol 50 MG tablet Commonly known as: TENORMIN Take 50 mg by mouth daily.   cefdinir 300 MG capsule Commonly known as: OMNICEF Take 1 capsule (300 mg total) by mouth 2 (two) times daily for 3 days.   cholecalciferol 25 MCG (1000 UNIT) tablet Commonly known as: VITAMIN D3 Take 1,000 Units by mouth daily.   folic acid 1 MG tablet Commonly known as: FOLVITE Take 1 tablet (1 mg total) by mouth daily.   hydrocortisone 2.5 % cream Apply 1 application topically daily as needed (itch/rash).   oxybutynin 10 MG 24 hr tablet Commonly known as: DITROPAN-XL Take 10 mg by mouth daily.   PHENobarbital 97.2 MG tablet Commonly known as: LUMINAL Take 97.2 mg by mouth 2 (two)  times daily.   warfarin 5 MG tablet Commonly known as: COUMADIN Take as directed. If you are unsure how to take this medication, talk to your nurse or doctor. Original instructions: TAKE 2-3 TABLETS BY MOUTH ONCE DAILY AS DIRECTED BY COUMADIN CLINIC               Durable Medical Equipment  (From admission, onward)           Start     Ordered   05/22/21 1414  For home use only DME 3 n 1  Once       Comments: Bariatric due to body habitus 5'2" 129KG   05/22/21 1414   05/22/21 1414  For home use only DME Walker rolling  Once       Question Answer Comment  Walker: With [place] Wheels   Patient needs a walker to treat with the following condition Weakness      05/22/21 1414            Follow-up Information     Health, [facility]. Schedule an appointment as soon as possible for a visit in 1 week(s).  Contact information: [place] [address] [phone]         Arnoldo Lenis, MD Follow up in 1 month(s).   Specialty: Cardiology Contact information: [place] [address] [phone]                No Known Allergies   Other Procedures/Studies: CT ABDOMEN WO CONTRAST  Result Date: 05/19/2021 CLINICAL DATA:  Nephrostomy tubes, catheter leaking, intra-abdominal abscess suspected EXAM: CT ABDOMEN WITHOUT CONTRAST TECHNIQUE: Multidetector CT imaging of the abdomen was performed following the standard protocol without IV contrast. RADIATION DOSE REDUCTION: This exam was performed according to the departmental dose-optimization program which includes automated exposure control, adjustment of the mA and/or kV according to patient size and/or use of iterative reconstruction technique. COMPARISON:  04/18/2021 FINDINGS: Lower chest: No acute abnormality. Hepatobiliary: No solid liver abnormality is seen. No gallstones, gallbladder wall thickening, or biliary dilatation. Pancreas: Unremarkable. No pancreatic  ductal dilatation or surrounding inflammatory changes. Spleen: Normal in size without significant abnormality. Adrenals/Urinary Tract: Adrenal glands are unremarkable. There are bilateral percutaneous nephrostomy tubes. Left-sided tube has been retracted into the superficial soft tissues overlying the left flank (series 2, image 34). Right-sided tube appears to be positioned within the inferior pole cortex of the right kidney, and is not clearly within collecting (series 2, image 33). There is mild, left greater than right hydronephrosis. Bladder is unremarkable. Stomach/Bowel: Stomach is within normal limits. Appendix appears normal. No evidence of bowel wall thickening, distention, or inflammatory changes. Vascular/Lymphatic: Aortic atherosclerosis. Numerous enlarged retroperitoneal iliac lymph nodes, unchanged compared to prior examination (series 2, image 31 45). Other: No abdominal wall hernia or abnormality. No ascites. Musculoskeletal: No acute or significant osseous findings. IMPRESSION: 1. Bilateral percutaneous nephrostomy tubes. Left-sided tube has been retracted into the superficial soft tissues overlying the left flank. 2. Right-sided tube appears to be positioned within the inferior pole cortex of the right kidney, and is not clearly within collecting. 3. Mild, left greater than right hydronephrosis. 4. Numerous enlarged retroperitoneal and iliac lymph nodes, unchanged. Aortic Atherosclerosis (ICD10-I70.0). Electronically Signed   By: Delanna Ahmadi M.D.   On: 05/19/2021 16:02   DG Chest Port 1 View  Result Date: 05/19/2021 CLINICAL DATA:  Tachypnea EXAM: PORTABLE CHEST 1 VIEW COMPARISON:  None FINDINGS: The cardiomediastinal silhouette is unremarkable. There is no evidence of focal airspace disease, pulmonary edema, suspicious pulmonary nodule/mass, pleural effusion, or pneumothorax. No acute bony abnormalities are identified. IMPRESSION: No active disease. Electronically Signed   By: Margarette Canada  M.D.   On: 05/19/2021 17:03   IR NEPHROSTOMY EXCHANGE LEFT  Result Date: 05/20/2021 INDICATION: History of cervical cancer, post placement of bilateral percutaneous nephrostomy catheters on 04/21/2021. Patient presented to the emergency department last evening with CT scan demonstrating retraction of the left-sided nephrostomy catheter with end coiled and locked within the subcutaneous tissues of the left flank with mild-to-moderate recurrent left-sided pelvicaliectasis. Additionally, the right-sided nephrostomy catheter has been retracted with end coiled and locked within the right renal cortex with radiopaque side marker retracted outside the renal parenchyma. As such, patient now presents for fluoroscopic guided bilateral replacement/exchange. EXAM: FLUOROSCOPIC GUIDED BILATERAL SIDED NEPHROSTOMY CATHETER EXCHANGE COMPARISON:  CT abdomen pelvis-05/19/2021; 04/18/2021 Image guided bilateral percutaneous nephrostomy catheter placement-04/21/2021 CONTRAST:  A total of 10 mL Isovue-300 administered was administered into both collecting systems FLUOROSCOPY TIME:  3 minutes, 18 seconds (71.6 mGy) COMPLICATIONS: None immediate. TECHNIQUE: Informed written consent was obtained from the patient  after a discussion of the risks, benefits and alternatives to treatment. Questions regarding the procedure were encouraged and answered. A timeout was performed prior to the initiation of the procedure. The bilateral flanks and external portions of existing nephrostomy catheters were prepped and draped in the usual sterile fashion. A sterile drape was applied covering the operative field. Maximum barrier sterile technique with sterile gowns and gloves were used for the procedure. A timeout was performed prior to the initiation of the procedure. Preprocedural spot fluoroscopic image was obtained of the right flank demonstrating retraction of the right-sided nephrostomy catheter. Contrast injection demonstrated the end of the  catheter remain partially within the right renal collecting system. As such, the external portion of the drainage catheter was cut and cannulated with an Amplatz wire. Under imaging fluoroscopic guidance, the nephrostomy catheter was exchanged for a short Kumpe catheter which was manipulated to the level of the superior aspect the right ureter. Contrast injection confirmed appropriate positioning. Next, over a short Amplatz wire, a new, slightly larger now 12 French percutaneous nephrostomy catheter was placed with end coiled and locked within the right renal pelvis. Attention was now paid towards the malpositioned left-sided nephrostomy catheter. Preprocedural spot fluoroscopic image demonstrates significant retraction of the left-sided nephrostomy catheter outside the expected location of the left renal collecting system. Contrast injection fortunately demonstrated a serpiginous tract to the level of the left renal collecting system. The external portion of the catheter was cut and cannulated with a regular glidewire. Under intermittent fluoroscopic guidance, the nephrostomy catheter was exchanged for a short Kumpe catheter which was manipulated through the subcutaneous track to the level of the left renal pelvis. Contrast injection confirmed appropriate positioning Next, over a short Amplatz wire, the Kumpe catheter was exchanged for a new now slightly larger 12 French nephrostomy catheter with end coiled and locked within the left renal pelvis. Both nephrostomy catheters were flushed with a small amount of saline and connected to gravity bags. Both drainage catheters were secured with two interrupted 0 Prolene sutures and StatLock devices. Dressings were applied. The patient tolerated the above procedures well without immediate postprocedural complication. FINDINGS: The bilateral nephrostomy catheters are malpositioned with the left-sided nephrostomy catheter completely outside the left-sided renal collecting  system while the right-sided nephrostomy catheter was slightly withdrawn to the edge of the accessed calyx. After successful fluoroscopic guided exchange, and repositioning, the new now slightly larger, 12 French nephrostomy catheters are both appropriately coiled and locked within the respective renal pelvises. IMPRESSION: Successful fluoroscopic guided exchange, repositioning and up sizing of now 12 French bilateral percutaneous nephrostomy catheters. PLAN: - Consideration for future antegrade ureteral stent placement may be performed at the discretion of the urologic service as indicated. - Otherwise, recommend routine fluoroscopic guided bilateral nephrostomy catheter exchange in 6 weeks. Electronically Signed   By: Sandi Mariscal M.D.   On: 05/20/2021 10:43   IR NEPHROSTOMY EXCHANGE RIGHT  Result Date: 05/20/2021 INDICATION: History of cervical cancer, post placement of bilateral percutaneous nephrostomy catheters on 04/21/2021. Patient presented to the emergency department last evening with CT scan demonstrating retraction of the left-sided nephrostomy catheter with end coiled and locked within the subcutaneous tissues of the left flank with mild-to-moderate recurrent left-sided pelvicaliectasis. Additionally, the right-sided nephrostomy catheter has been retracted with end coiled and locked within the right renal cortex with radiopaque side marker retracted outside the renal parenchyma. As such, patient now presents for fluoroscopic guided bilateral replacement/exchange. EXAM: FLUOROSCOPIC GUIDED BILATERAL SIDED NEPHROSTOMY CATHETER EXCHANGE COMPARISON:  CT  abdomen pelvis-05/19/2021; 04/18/2021 Image guided bilateral percutaneous nephrostomy catheter placement-04/21/2021 CONTRAST:  A total of 10 mL Isovue-300 administered was administered into both collecting systems FLUOROSCOPY TIME:  3 minutes, 18 seconds (66.0 mGy) COMPLICATIONS: None immediate. TECHNIQUE: Informed written consent was obtained from the  patient after a discussion of the risks, benefits and alternatives to treatment. Questions regarding the procedure were encouraged and answered. A timeout was performed prior to the initiation of the procedure. The bilateral flanks and external portions of existing nephrostomy catheters were prepped and draped in the usual sterile fashion. A sterile drape was applied covering the operative field. Maximum barrier sterile technique with sterile gowns and gloves were used for the procedure. A timeout was performed prior to the initiation of the procedure. Preprocedural spot fluoroscopic image was obtained of the right flank demonstrating retraction of the right-sided nephrostomy catheter. Contrast injection demonstrated the end of the catheter remain partially within the right renal collecting system. As such, the external portion of the drainage catheter was cut and cannulated with an Amplatz wire. Under imaging fluoroscopic guidance, the nephrostomy catheter was exchanged for a short Kumpe catheter which was manipulated to the level of the superior aspect the right ureter. Contrast injection confirmed appropriate positioning. Next, over a short Amplatz wire, a new, slightly larger now 12 French percutaneous nephrostomy catheter was placed with end coiled and locked within the right renal pelvis. Attention was now paid towards the malpositioned left-sided nephrostomy catheter. Preprocedural spot fluoroscopic image demonstrates significant retraction of the left-sided nephrostomy catheter outside the expected location of the left renal collecting system. Contrast injection fortunately demonstrated a serpiginous tract to the level of the left renal collecting system. The external portion of the catheter was cut and cannulated with a regular glidewire. Under intermittent fluoroscopic guidance, the nephrostomy catheter was exchanged for a short Kumpe catheter which was manipulated through the subcutaneous track to the  level of the left renal pelvis. Contrast injection confirmed appropriate positioning Next, over a short Amplatz wire, the Kumpe catheter was exchanged for a new now slightly larger 12 French nephrostomy catheter with end coiled and locked within the left renal pelvis. Both nephrostomy catheters were flushed with a small amount of saline and connected to gravity bags. Both drainage catheters were secured with two interrupted 0 Prolene sutures and StatLock devices. Dressings were applied. The patient tolerated the above procedures well without immediate postprocedural complication. FINDINGS: The bilateral nephrostomy catheters are malpositioned with the left-sided nephrostomy catheter completely outside the left-sided renal collecting system while the right-sided nephrostomy catheter was slightly withdrawn to the edge of the accessed calyx. After successful fluoroscopic guided exchange, and repositioning, the new now slightly larger, 12 French nephrostomy catheters are both appropriately coiled and locked within the respective renal pelvises. IMPRESSION: Successful fluoroscopic guided exchange, repositioning and up sizing of now 12 French bilateral percutaneous nephrostomy catheters. PLAN: - Consideration for future antegrade ureteral stent placement may be performed at the discretion of the urologic service as indicated. - Otherwise, recommend routine fluoroscopic guided bilateral nephrostomy catheter exchange in 6 weeks. Electronically Signed   By: Sandi Mariscal M.D.   On: 05/20/2021 10:43     TODAY-DAY OF DISCHARGE:  Subjective:   Linde Gillis today has no headache,no chest abdominal pain,no new weakness tingling or numbness, feels much better wants to go home today.   Objective:   Blood pressure 140/73, pulse 74, temperature (!) 97.5 F (36.4 C), temperature source Oral, resp. rate 18, height 5\' 2"  (1.575 m), weight 129  kg, SpO2 100 %.  Intake/Output Summary (Last 24 hours) at 05/23/2021 1026 Last data  filed at 05/23/2021 0459 Gross per 24 hour  Intake 100 ml  Output 1450 ml  Net -1350 ml   Filed Weights   05/19/21 2152  Weight: 129 kg    Exam: Awake Alert, Oriented *3, No new F.N deficits, Normal affect [place].AT,PERRAL Supple Neck,No JVD, No cervical lymphadenopathy appriciated.  Symmetrical Chest wall movement, Good air movement bilaterally, CTAB RRR,No Gallops,Rubs or new Murmurs, No Parasternal Heave +ve B.Sounds, Abd Soft, Non tender, No organomegaly appriciated, No rebound -guarding or rigidity. No Cyanosis, Clubbing or edema, No new Rash or bruise   PERTINENT RADIOLOGIC STUDIES: No results found.   PERTINENT LAB RESULTS: CBC: Recent Labs    05/21/21 0221 05/22/21 0034  WBC 14.1* 8.7  HGB 9.2* 9.3*  HCT 28.5* 29.8*  PLT 310 328   CMET CMP     Component Value Date/Time   NA 134 (L) 05/23/2021 0039   K 4.0 05/23/2021 0039   CL 104 05/23/2021 0039   CO2 19 (L) 05/23/2021 0039   GLUCOSE 97 05/23/2021 0039   BUN 26 (H) 05/23/2021 0039   CREATININE 1.88 (H) 05/23/2021 0039   CREATININE 2.08 (H) 05/17/2021 1320   CALCIUM 8.4 (L) 05/23/2021 0039   PROT 6.1 (L) 05/21/2021 0221   ALBUMIN 1.8 (L) 05/21/2021 0221   AST 42 (H) 05/21/2021 0221   AST 47 (H) 05/17/2021 1320   ALT 23 05/21/2021 0221   ALT 30 05/17/2021 1320   ALKPHOS 67 05/21/2021 0221   BILITOT 0.6 05/21/2021 0221   BILITOT 0.5 05/17/2021 1320   GFRNONAA 30 (L) 05/23/2021 0039   GFRNONAA 26 (L) 05/17/2021 1320   GFRAA >60 08/12/2018 0552    GFR Estimated Creatinine Clearance: 40 mL/min (A) (by C-G formula based on SCr of 1.88 mg/dL (H)). No results for input(s): LIPASE, AMYLASE in the last 72 hours. No results for input(s): CKTOTAL, CKMB, CKMBINDEX, TROPONINI in the last 72 hours. Invalid input(s): POCBNP No results for input(s): DDIMER in the last 72 hours. No results for input(s): HGBA1C in the last 72 hours. No results for input(s): CHOL, HDL, LDLCALC, TRIG, CHOLHDL, LDLDIRECT in the last  72 hours. No results for input(s): TSH, T4TOTAL, T3FREE, THYROIDAB in the last 72 hours.  Invalid input(s): FREET3 No results for input(s): VITAMINB12, FOLATE, FERRITIN, TIBC, IRON, RETICCTPCT in the last 72 hours. Coags: Recent Labs    05/22/21 0034 05/23/21 0039  INR 2.4* 2.6*   Microbiology: Recent Results (from the past 240 hour(s))  Blood culture (routine x 2)     Status: None (Preliminary result)   Collection Time: 05/19/21  2:49 PM   Specimen: Left Antecubital; Blood  Result Value Ref Range Status   Specimen Description   Final    LEFT ANTECUBITAL BOTTLES DRAWN AEROBIC AND ANAEROBIC   Special Requests Blood Culture adequate volume  Final   Culture   Final    NO GROWTH 3 DAYS Performed at [facility], [address], [place], [place] [address]    Report Status PENDING  Incomplete  Blood culture (routine x 2)     Status: None (Preliminary result)   Collection Time: 05/19/21  2:54 PM   Specimen: BLOOD  Result Value Ref Range Status   Specimen Description BLOOD  Final   Special Requests BLOOD  Final   Culture   Final    NO GROWTH 3 DAYS Performed at [facility], [address] [place],  [place] [address]    Report Status PENDING  Incomplete  Urine Culture     Status: Abnormal   Collection Time: 05/19/21  5:11 PM   Specimen: Urine, Catheterized  Result Value Ref Range Status   Specimen Description   Final    URINE, CATHETERIZED Performed at [facility] [address], [place], [place] [address]    Special Requests   Final    NONE Performed at [facility], [address] [place], [place] [address]    Culture MULTIPLE SPECIES PRESENT, SUGGEST RECOLLECTION (A)  Final   Report Status 05/21/2021 FINAL  Final  Resp Panel by RT-PCR (Flu A&B, Covid) Nasopharyngeal Swab     Status: None   Collection Time: 05/19/21  7:43 PM   Specimen: Nasopharyngeal Swab; Nasopharyngeal(NP) swabs in vial transport medium  Result Value Ref Range Status   SARS Coronavirus 2 by RT PCR  NEGATIVE NEGATIVE Final    Comment: (NOTE) SARS-CoV-2 target nucleic acids are NOT DETECTED.  The SARS-CoV-2 RNA is generally detectable in upper respiratory specimens during the acute phase of infection. The lowest concentration of SARS-CoV-2 viral copies this assay can detect is 138 copies/mL. A negative result does not preclude SARS-Cov-2 infection and should not be used as the sole basis for treatment or other patient management decisions. A negative result may occur with  improper specimen collection/handling, submission of specimen other than nasopharyngeal swab, presence of viral mutation(s) within the areas targeted by this assay, and inadequate number of viral copies(<138 copies/mL). A negative result must be combined with clinical observations, patient history, and epidemiological information. The expected result is Negative.  Fact Sheet for Patients:  EntrepreneurPulse.com.au  Fact Sheet for Healthcare Providers:  IncredibleEmployment.be  This test is no t yet approved or cleared by the [place] FDA and  has been authorized for detection and/or diagnosis of SARS-CoV-2 by FDA under an Emergency Use Authorization (EUA). This EUA will remain  in effect (meaning this test can be used) for the duration of the COVID-19 declaration under Section 564(b)(1) of the Act, 21 U.S.C.section 360bbb-3(b)(1), unless the authorization is terminated  or revoked sooner.       Influenza A by PCR NEGATIVE NEGATIVE Final   Influenza B by PCR NEGATIVE NEGATIVE Final    Comment: (NOTE) The Xpert Xpress SARS-CoV-2/FLU/RSV plus assay is intended as an aid in the diagnosis of influenza from Nasopharyngeal swab specimens and should not be used as a sole basis for treatment. Nasal washings and aspirates are unacceptable for Xpert Xpress SARS-CoV-2/FLU/RSV testing.  Fact Sheet for Patients: EntrepreneurPulse.com.au  Fact Sheet for  Healthcare Providers: IncredibleEmployment.be  This test is not yet approved or cleared by the [place] FDA and has been authorized for detection and/or diagnosis of SARS-CoV-2 by FDA under an Emergency Use Authorization (EUA). This EUA will remain in effect (meaning this test can be used) for the duration of the COVID-19 declaration under Section 564(b)(1) of the Act, 21 U.S.C. section 360bbb-3(b)(1), unless the authorization is terminated or revoked.  Performed at [facility], [address], [place], [place] [address]     FURTHER DISCHARGE INSTRUCTIONS:  Get Medicines reviewed and adjusted: Please take all your medications with you for your next visit with your Primary MD  Laboratory/radiological data: Please request your Primary MD to go over all hospital tests and procedure/radiological results at the follow up, please ask your Primary MD to get all Hospital records sent to his/her office.  In some cases, they will be blood work,  cultures and biopsy results pending at the time of your discharge. Please request that your primary care M.D. goes through all the records of your hospital data and follows up on these results.  Also Note the following: If you experience worsening of your admission symptoms, develop shortness of breath, life threatening emergency, suicidal or homicidal thoughts you must seek medical attention immediately by calling 911 or calling your MD immediately  if symptoms less severe.  You must read complete instructions/literature along with all the possible adverse reactions/side effects for all the Medicines you take and that have been prescribed to you. Take any new Medicines after you have completely understood and accpet all the possible adverse reactions/side effects.   Do not drive when taking Pain medications or sleeping medications (Benzodaizepines)  Do not take more than prescribed Pain, Sleep and Anxiety Medications. It is not  advisable to combine anxiety,sleep and pain medications without talking with your primary care practitioner  Special Instructions: If you have smoked or chewed Tobacco  in the last 2 yrs please stop smoking, stop any regular Alcohol  and or any Recreational drug use.  Wear Seat belts while driving.  Please note: You were cared for by a hospitalist during your hospital stay. Once you are discharged, your primary care physician will handle any further medical issues. Please note that NO REFILLS for any discharge medications will be authorized once you are discharged, as it is imperative that you return to your primary care physician (or establish a relationship with a primary care physician if you do not have one) for your post hospital discharge needs so that they can reassess your need for medications and monitor your lab values.  Total Time spent coordinating discharge including counseling, education and face to face time equals greater than 30 minutes.  SignedOren Binet 05/23/2021 10:26 AM

## 2021-05-23 NOTE — Progress Notes (Signed)
Completed discharge instructions. Notified Case manager that patient didn't have her bedside commode. Was informed that it will be shipped due to being out of stock. Spoke with patient's daughter about  discharging. According to daughter and patient, the patient's sister is in route to take the patient home.

## 2021-05-23 NOTE — Progress Notes (Signed)
Physical Therapy Treatment Patient Details Name: Debbie [name] MRN: [id number] DOB: [birth year] Today's Date: 05/23/2021   History of Present Illness 61 yo admitted 2/4 with left nephrostomy tube dislodgement and sepsis from complicated UTI. 2/5 nephrostomy tube exchange. PMHx; obstructive uropathy with bil nephrostomy tubes, stage IV cervical CA, Afib, HTN, obesity, seizure disorder    PT Comments    Patient agreeable to ambulation. Moving well, however HR increases to 150 bpm with ambulating 40 ft.    Recommendations for follow up therapy are one component of a multi-disciplinary discharge planning process, led by the attending physician.  Recommendations may be updated based on patient status, additional functional criteria and insurance authorization.  Follow Up Recommendations  Home health PT     Assistance Recommended at Discharge Intermittent Supervision/Assistance  Patient can return home with the following A little help with walking and/or transfers;A little help with bathing/dressing/bathroom;Assistance with cooking/housework;Assist for transportation   Equipment Recommendations  Rolling walker (2 wheels);BSC/3in1    Recommendations for Other Services       Precautions / Restrictions Precautions Precautions: Fall Precaution Comments: bil nephrostomy tubes     Mobility  Bed Mobility Overal bed mobility: Needs Assistance Bed Mobility: Supine to Sit       Sit to supine: Modified independent (Device/Increase time)   General bed mobility comments: use of rail    Transfers Overall transfer level: Needs assistance Equipment used: None Transfers: Sit to/from Stand Sit to Stand: Supervision           General transfer comment: no physical assist or cues needed    Ambulation/Gait Ambulation/Gait assistance: Supervision Gait Distance (Feet): 40 Feet Assistive device: None Gait Pattern/deviations: WFL(Within Functional Limits)       General Gait Details:  HR up to 150 bpm with short distance ambulation   Stairs             Wheelchair Mobility    Modified Rankin (Stroke Patients Only)       Balance Overall balance assessment: Needs assistance Sitting-balance support: Feet supported, No upper extremity supported Sitting balance-Leahy Scale: Fair     Standing balance support: No upper extremity supported, During functional activity Standing balance-Leahy Scale: Fair                              Cognition Arousal/Alertness: Awake/alert Behavior During Therapy: WFL for tasks assessed/performed Overall Cognitive Status: Within Functional Limits for tasks assessed                                          Exercises      General Comments        Pertinent Vitals/Pain      Home Living                          Prior Function            PT Goals (current goals can now be found in the care plan section) Acute Rehab PT Goals Patient Stated Goal: return home Time For Goal Achievement: 06/04/21 Potential to Achieve Goals: Fair Progress towards PT goals: Progressing toward goals    Frequency    Min 3X/week      PT Plan Current plan remains appropriate    Co-evaluation  AM-PAC PT "6 Clicks" Mobility   Outcome Measure  Help needed turning from your back to your side while in a flat bed without using bedrails?: A Little Help needed moving from lying on your back to sitting on the side of a flat bed without using bedrails?: A Little Help needed moving to and from a bed to a chair (including a wheelchair)?: A Little Help needed standing up from a chair using your arms (e.g., wheelchair or bedside chair)?: A Little Help needed to walk in hospital room?: A Little Help needed climbing 3-5 steps with a railing? : Total 6 Click Score: 16    End of Session   Activity Tolerance: Treatment limited secondary to medical complications (Comment) (limited by  elevated HR) Patient left: with call bell/phone within reach;in chair Nurse Communication: Mobility status PT Visit Diagnosis: Other abnormalities of gait and mobility (R26.89);Difficulty in walking, not elsewhere classified (R26.2);Muscle weakness (generalized) (M62.81);Repeated falls (R29.6)     Time: 0802-2336 PT Time Calculation (min) (ACUTE ONLY): 13 min  Charges:  $Gait Training: 8-22 mins                      Arby Barrette, PT Acute Rehabilitation Services  Pager [phone] Office [phone]    Rexanne Mano 05/23/2021, 9:17 AM

## 2021-05-24 ENCOUNTER — Ambulatory Visit
Admission: RE | Admit: 2021-05-24 | Discharge: 2021-05-24 | Disposition: A | Discharge status: Home / Self Care | Payer: Medicaid Other | Source: Ambulatory Visit | Attending: Radiation Oncology | Admitting: Radiation Oncology

## 2021-05-24 ENCOUNTER — Other Ambulatory Visit: Payer: Self-pay

## 2021-05-24 ENCOUNTER — Telehealth: Payer: Self-pay | Admitting: Oncology

## 2021-05-24 DIAGNOSIS — Z510 Encounter for antineoplastic radiation therapy: Secondary | ICD-10-CM | POA: Insufficient documentation

## 2021-05-24 DIAGNOSIS — C539 Malignant neoplasm of cervix uteri, unspecified: Secondary | ICD-10-CM | POA: Diagnosis present

## 2021-05-24 LAB — CULTURE, BLOOD (ROUTINE X 2)
Culture: NO GROWTH
Culture: NO GROWTH
Special Requests: ADEQUATE

## 2021-05-24 NOTE — Telephone Encounter (Signed)
Debbie [name] with rescheduled PET scan appointment on 06/05/21 at 10:00.  Told her to arrive at 9:30 and to not eat or drink 6 hours before the scan.  She verbalized understanding and agreement.

## 2021-05-28 ENCOUNTER — Other Ambulatory Visit: Payer: Self-pay

## 2021-05-28 ENCOUNTER — Other Ambulatory Visit: Payer: Self-pay | Admitting: Oncology

## 2021-05-28 NOTE — Progress Notes (Signed)
Gynecologic Oncology Multi-Disciplinary Disposition Conference Note  Date of the Conference: 05/28/2021  Patient Name: Debbie [name]  Primary GYN Oncologist: Dr. Berline Lopes Medical Oncologist: Dr. Alvy Bimler Radiation Oncologist: Dr. 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 Disposition is to external beam radiation and brachytherapy followed by palliative chemotherapy.   This Multidisciplinary conference took place involving physicians from [place], Medical Oncology, Radiation Oncology, Pathology, Radiology along with the Gynecologic Oncology Nurse Practitioner and Gynecologic Oncology Nurse Navigator.  Comprehensive assessment of the patient's malignancy, staging, need for surgery, chemotherapy, radiation therapy, and need for further testing were reviewed. Supportive measures, both inpatient and following discharge were also discussed. The recommended plan of care is documented. Greater than 35 minutes were spent correlating and coordinating this patient's care.

## 2021-05-31 DIAGNOSIS — Z510 Encounter for antineoplastic radiation therapy: Secondary | ICD-10-CM | POA: Diagnosis not present

## 2021-06-04 ENCOUNTER — Ambulatory Visit
Admission: RE | Admit: 2021-06-04 | Discharge: 2021-06-04 | Disposition: A | Discharge status: Home / Self Care | Payer: Medicaid Other | Source: Ambulatory Visit | Attending: Radiation Oncology | Admitting: Radiation Oncology

## 2021-06-04 ENCOUNTER — Other Ambulatory Visit: Payer: Self-pay

## 2021-06-04 DIAGNOSIS — Z510 Encounter for antineoplastic radiation therapy: Secondary | ICD-10-CM | POA: Diagnosis not present

## 2021-06-04 DIAGNOSIS — C539 Malignant neoplasm of cervix uteri, unspecified: Secondary | ICD-10-CM

## 2021-06-05 ENCOUNTER — Encounter (HOSPITAL_COMMUNITY)
Admission: RE | Admit: 2021-06-05 | Discharge: 2021-06-05 | Disposition: A | Discharge status: Home / Self Care | Payer: Medicaid Other | Source: Ambulatory Visit | Attending: Gynecologic Oncology | Admitting: Gynecologic Oncology

## 2021-06-05 ENCOUNTER — Ambulatory Visit
Admission: RE | Admit: 2021-06-05 | Discharge: 2021-06-05 | Disposition: A | Discharge status: Home / Self Care | Payer: Medicaid Other | Source: Ambulatory Visit | Attending: Radiation Oncology | Admitting: Radiation Oncology

## 2021-06-05 DIAGNOSIS — C539 Malignant neoplasm of cervix uteri, unspecified: Secondary | ICD-10-CM | POA: Insufficient documentation

## 2021-06-05 DIAGNOSIS — Z510 Encounter for antineoplastic radiation therapy: Secondary | ICD-10-CM | POA: Diagnosis not present

## 2021-06-05 LAB — GLUCOSE, CAPILLARY: Glucose-Capillary: 104 mg/dL — ABNORMAL HIGH (ref 70–99)

## 2021-06-05 MED ORDER — FLUDEOXYGLUCOSE F - 18 (FDG) INJECTION
14.0000 | Freq: Once | INTRAVENOUS | Status: AC
  Administered 2021-06-05: 14 via INTRAVENOUS

## 2021-06-05 NOTE — Progress Notes (Signed)
Pt here for patient teaching.    Pt given Radiation and You booklet.    Reviewed areas of pertinence such as diarrhea, fatigue, hair loss, mouth changes, nausea and vomiting, sexual and fertility changes, skin changes, and urinary and bladder changes .   Pt able to give teach back of to pat skin, use unscented/gentle soap, have Imodium on hand, and drink plenty of water,avoid applying anything to skin within 4 hours of treatment.   Pt verbalizes understanding of information given and will contact nursing with any questions or concerns.

## 2021-06-06 ENCOUNTER — Ambulatory Visit
Admission: RE | Admit: 2021-06-06 | Discharge: 2021-06-06 | Disposition: A | Discharge status: Home / Self Care | Payer: Medicaid Other | Source: Ambulatory Visit | Attending: Radiation Oncology | Admitting: Radiation Oncology

## 2021-06-06 ENCOUNTER — Other Ambulatory Visit: Payer: Self-pay

## 2021-06-06 DIAGNOSIS — Z510 Encounter for antineoplastic radiation therapy: Secondary | ICD-10-CM | POA: Diagnosis not present

## 2021-06-07 ENCOUNTER — Ambulatory Visit
Admission: RE | Admit: 2021-06-07 | Discharge: 2021-06-07 | Disposition: A | Discharge status: Home / Self Care | Payer: Medicaid Other | Source: Ambulatory Visit | Attending: Radiation Oncology | Admitting: Radiation Oncology

## 2021-06-07 DIAGNOSIS — Z510 Encounter for antineoplastic radiation therapy: Secondary | ICD-10-CM | POA: Diagnosis not present

## 2021-06-08 ENCOUNTER — Ambulatory Visit
Admission: RE | Admit: 2021-06-08 | Discharge: 2021-06-08 | Disposition: A | Discharge status: Home / Self Care | Payer: Medicaid Other | Source: Ambulatory Visit | Attending: Radiation Oncology | Admitting: Radiation Oncology

## 2021-06-08 ENCOUNTER — Telehealth: Payer: Self-pay | Admitting: Gynecologic Oncology

## 2021-06-08 ENCOUNTER — Other Ambulatory Visit: Payer: Self-pay

## 2021-06-08 ENCOUNTER — Encounter: Payer: Self-pay | Admitting: Gynecologic Oncology

## 2021-06-08 ENCOUNTER — Telehealth: Payer: Self-pay | Admitting: Radiology

## 2021-06-08 DIAGNOSIS — Z510 Encounter for antineoplastic radiation therapy: Secondary | ICD-10-CM | POA: Diagnosis not present

## 2021-06-08 NOTE — Telephone Encounter (Signed)
Patient would like return call regarding PET results from 06/05/2021.

## 2021-06-08 NOTE — Telephone Encounter (Signed)
Called the patient twice today to discuss PET scan results.  No answer.  Left voicemail letting her know I would send a message in my chart.  We will also plan to call her again on Monday after our tumor board discussion.

## 2021-06-11 ENCOUNTER — Ambulatory Visit
Admission: RE | Admit: 2021-06-11 | Discharge: 2021-06-11 | Disposition: A | Discharge status: Home / Self Care | Payer: Medicaid Other | Source: Ambulatory Visit | Attending: Radiation Oncology | Admitting: Radiation Oncology

## 2021-06-11 ENCOUNTER — Other Ambulatory Visit: Payer: Self-pay | Admitting: Oncology

## 2021-06-11 ENCOUNTER — Telehealth: Payer: Self-pay | Admitting: Oncology

## 2021-06-11 ENCOUNTER — Other Ambulatory Visit: Payer: Self-pay

## 2021-06-11 DIAGNOSIS — Z510 Encounter for antineoplastic radiation therapy: Secondary | ICD-10-CM | POA: Diagnosis not present

## 2021-06-11 NOTE — Progress Notes (Signed)
Gynecologic Oncology Multi-Disciplinary Disposition Conference Note  Date of the Conference: 06/11/2021  Patient Name: Debbie [name]  Primary GYN Oncologist: Dr. Berline Lopes Medical Oncologist: Dr. Alvy Bimler Radiation Oncologist: Dr. 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  Disposition is for abbreviated radiation therapy to cervix and uterus and palliative chemotherapy.   This Multidisciplinary conference took place involving physicians from [place], Medical Oncology, Radiation Oncology, Pathology, Radiology along with the Gynecologic Oncology Nurse Practitioner and Gynecologic Oncology Nurse Navigator.  Comprehensive assessment of the patient's malignancy, staging, need for surgery, chemotherapy, radiation therapy, and need for further testing were reviewed. Supportive measures, both inpatient and following discharge were also discussed. The recommended plan of care is documented. Greater than 35 minutes were spent correlating and coordinating this patient's care.

## 2021-06-11 NOTE — Telephone Encounter (Signed)
Derrell Lolling and scheduled appointment with Dr. Alvy Bimler on 06/19/21 at 2:30.  She verbalized understanding and agreement.

## 2021-06-12 ENCOUNTER — Ambulatory Visit
Admission: RE | Admit: 2021-06-12 | Discharge: 2021-06-12 | Disposition: A | Discharge status: Home / Self Care | Payer: Medicaid Other | Source: Ambulatory Visit | Attending: Radiation Oncology | Admitting: Radiation Oncology

## 2021-06-12 ENCOUNTER — Other Ambulatory Visit: Payer: Self-pay

## 2021-06-12 DIAGNOSIS — Z510 Encounter for antineoplastic radiation therapy: Secondary | ICD-10-CM | POA: Diagnosis not present

## 2021-06-13 ENCOUNTER — Ambulatory Visit
Admission: RE | Admit: 2021-06-13 | Discharge: 2021-06-13 | Disposition: A | Discharge status: Home / Self Care | Payer: Medicaid Other | Source: Ambulatory Visit | Attending: Radiation Oncology | Admitting: Radiation Oncology

## 2021-06-13 DIAGNOSIS — I959 Hypotension, unspecified: Secondary | ICD-10-CM | POA: Insufficient documentation

## 2021-06-13 DIAGNOSIS — C539 Malignant neoplasm of cervix uteri, unspecified: Secondary | ICD-10-CM | POA: Diagnosis present

## 2021-06-13 DIAGNOSIS — R197 Diarrhea, unspecified: Secondary | ICD-10-CM | POA: Diagnosis not present

## 2021-06-13 DIAGNOSIS — N1330 Unspecified hydronephrosis: Secondary | ICD-10-CM | POA: Diagnosis not present

## 2021-06-13 DIAGNOSIS — N184 Chronic kidney disease, stage 4 (severe): Secondary | ICD-10-CM | POA: Diagnosis not present

## 2021-06-13 DIAGNOSIS — Z79899 Other long term (current) drug therapy: Secondary | ICD-10-CM | POA: Insufficient documentation

## 2021-06-13 DIAGNOSIS — Z510 Encounter for antineoplastic radiation therapy: Secondary | ICD-10-CM | POA: Insufficient documentation

## 2021-06-13 DIAGNOSIS — Z7901 Long term (current) use of anticoagulants: Secondary | ICD-10-CM | POA: Insufficient documentation

## 2021-06-14 ENCOUNTER — Ambulatory Visit
Admission: RE | Admit: 2021-06-14 | Discharge: 2021-06-14 | Disposition: A | Discharge status: Home / Self Care | Payer: Medicaid Other | Source: Ambulatory Visit | Attending: Radiation Oncology | Admitting: Radiation Oncology

## 2021-06-14 ENCOUNTER — Other Ambulatory Visit: Payer: Self-pay

## 2021-06-14 DIAGNOSIS — Z510 Encounter for antineoplastic radiation therapy: Secondary | ICD-10-CM | POA: Diagnosis not present

## 2021-06-15 ENCOUNTER — Ambulatory Visit
Admission: RE | Admit: 2021-06-15 | Discharge: 2021-06-15 | Disposition: A | Discharge status: Home / Self Care | Payer: Medicaid Other | Source: Ambulatory Visit | Attending: Radiation Oncology | Admitting: Radiation Oncology

## 2021-06-15 DIAGNOSIS — Z510 Encounter for antineoplastic radiation therapy: Secondary | ICD-10-CM | POA: Diagnosis not present

## 2021-06-18 ENCOUNTER — Other Ambulatory Visit: Payer: Self-pay

## 2021-06-18 ENCOUNTER — Ambulatory Visit
Admission: RE | Admit: 2021-06-18 | Discharge: 2021-06-18 | Disposition: A | Discharge status: Home / Self Care | Payer: Medicaid Other | Source: Ambulatory Visit | Attending: Radiation Oncology | Admitting: Radiation Oncology

## 2021-06-18 DIAGNOSIS — Z510 Encounter for antineoplastic radiation therapy: Secondary | ICD-10-CM | POA: Diagnosis not present

## 2021-06-18 DIAGNOSIS — C539 Malignant neoplasm of cervix uteri, unspecified: Secondary | ICD-10-CM

## 2021-06-19 ENCOUNTER — Ambulatory Visit
Admission: RE | Admit: 2021-06-19 | Discharge: 2021-06-19 | Disposition: A | Discharge status: Home / Self Care | Payer: Medicaid Other | Source: Ambulatory Visit | Attending: Radiation Oncology | Admitting: Radiation Oncology

## 2021-06-19 ENCOUNTER — Inpatient Hospital Stay: Payer: Medicaid Other | Admitting: Hematology and Oncology

## 2021-06-19 DIAGNOSIS — N184 Chronic kidney disease, stage 4 (severe): Secondary | ICD-10-CM

## 2021-06-19 DIAGNOSIS — Z79899 Other long term (current) drug therapy: Secondary | ICD-10-CM | POA: Insufficient documentation

## 2021-06-19 DIAGNOSIS — N1330 Unspecified hydronephrosis: Secondary | ICD-10-CM | POA: Insufficient documentation

## 2021-06-19 DIAGNOSIS — I959 Hypotension, unspecified: Secondary | ICD-10-CM | POA: Diagnosis not present

## 2021-06-19 DIAGNOSIS — R197 Diarrhea, unspecified: Secondary | ICD-10-CM

## 2021-06-19 DIAGNOSIS — C539 Malignant neoplasm of cervix uteri, unspecified: Secondary | ICD-10-CM | POA: Insufficient documentation

## 2021-06-19 DIAGNOSIS — Z510 Encounter for antineoplastic radiation therapy: Secondary | ICD-10-CM | POA: Diagnosis not present

## 2021-06-19 DIAGNOSIS — Z7901 Long term (current) use of anticoagulants: Secondary | ICD-10-CM | POA: Insufficient documentation

## 2021-06-20 ENCOUNTER — Ambulatory Visit
Admission: RE | Admit: 2021-06-20 | Discharge: 2021-06-20 | Disposition: A | Discharge status: Home / Self Care | Payer: Medicaid Other | Source: Ambulatory Visit | Attending: Radiation Oncology | Admitting: Radiation Oncology

## 2021-06-20 ENCOUNTER — Ambulatory Visit (HOSPITAL_COMMUNITY): Payer: Medicaid Other | Attending: Internal Medicine | Admitting: Physical Therapy

## 2021-06-20 ENCOUNTER — Encounter (HOSPITAL_COMMUNITY): Payer: Self-pay | Admitting: Physical Therapy

## 2021-06-20 ENCOUNTER — Other Ambulatory Visit: Payer: Self-pay

## 2021-06-20 ENCOUNTER — Encounter: Payer: Self-pay | Admitting: Hematology and Oncology

## 2021-06-20 DIAGNOSIS — R42 Dizziness and giddiness: Secondary | ICD-10-CM | POA: Insufficient documentation

## 2021-06-20 DIAGNOSIS — A419 Sepsis, unspecified organism: Secondary | ICD-10-CM | POA: Insufficient documentation

## 2021-06-20 DIAGNOSIS — Z510 Encounter for antineoplastic radiation therapy: Secondary | ICD-10-CM | POA: Diagnosis not present

## 2021-06-20 DIAGNOSIS — R197 Diarrhea, unspecified: Secondary | ICD-10-CM | POA: Insufficient documentation

## 2021-06-20 DIAGNOSIS — R2689 Other abnormalities of gait and mobility: Secondary | ICD-10-CM | POA: Insufficient documentation

## 2021-06-20 NOTE — Assessment & Plan Note (Signed)
She is profoundly hypotensive ?Recommend reducing the dose of atenolol to half ?Her dose might need to be adjusted further if she continues to lose weight and have hypotension ?

## 2021-06-20 NOTE — Assessment & Plan Note (Signed)
She is hypotensive and has lost a lot of weight compared to her weight a month ago ?I suspect this is due to her diarrhea ?We discussed importance of aggressive fluid intake to avoid risk of progressive kidney failure ?

## 2021-06-20 NOTE — Patient Instructions (Signed)
Access Code: [email or web address] ?URL: [email or web address] ?Date: 06/20/2021 ?Prepared by: Adalberto Cole ? ?Exercises ?Heel Raises with Counter Support - 2-3 x daily - 7 x weekly - 3 sets - 10 reps ?Seated Heel Toe Raises - 2-3 x daily - 7 x weekly - 2 sets - 16 reps ?Ankle Inversion with Resistance - 2-3 x daily - 7 x weekly - 2 sets - 8 reps ?Ankle Eversion with Resistance - 2-3 x daily - 7 x weekly - 2 sets - 8 reps ?Standing March with Counter Support - 2-3 x daily - 7 x weekly - 3 sets - 12 reps ? ?
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
## 2021-06-20 NOTE — Assessment & Plan Note (Signed)
Her case was recently discussed at the GYN oncology tumor board ?She has very advanced extensive disease ?We felt that the patient could benefit from aggressive chemotherapy but choices of treatment is limited due to her significant class III obesity, chronic kidney disease stage 4 as well as recent hospitalization from infection ?Instead of the conventional treatment, she will get abbreviated shorter course of radiation therapy, followed by repeat imaging study 4 to 6 weeks later and then regroup to discuss treatment options ?She is in agreement to the plan of care ?I will try to coordinate appointment with her daughter who has not only come in the afternoon ?

## 2021-06-20 NOTE — Assessment & Plan Note (Signed)
She has severe diarrhea due to side effects of treatment ?We discussed the importance of taking Imodium ?
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
## 2021-06-21 ENCOUNTER — Telehealth: Payer: Self-pay | Admitting: Oncology

## 2021-06-21 ENCOUNTER — Ambulatory Visit
Admission: RE | Admit: 2021-06-21 | Discharge: 2021-06-21 | Disposition: A | Discharge status: Home / Self Care | Payer: Medicaid Other | Source: Ambulatory Visit | Attending: Radiation Oncology | Admitting: Radiation Oncology

## 2021-06-21 DIAGNOSIS — Z510 Encounter for antineoplastic radiation therapy: Secondary | ICD-10-CM | POA: Diagnosis not present

## 2021-06-21 NOTE — Telephone Encounter (Signed)
Called Debbie [name] and advised her of upcoming appointments on 07/27/21 for labs at 10:00 at [facility] and PET scan at [facility] with arrival at 10:30.  Instructed her to not eat or drink anything except water 6 hours before the scan.  Also advised her of appointment with Dr. Alvy Bimler on 07/30/21 at 2:00 to review the scan.  She verbalized understanding and agreement of all appointments. ?

## 2021-06-22 ENCOUNTER — Other Ambulatory Visit: Payer: Self-pay

## 2021-06-22 ENCOUNTER — Ambulatory Visit
Admission: RE | Admit: 2021-06-22 | Discharge: 2021-06-22 | Disposition: A | Discharge status: Home / Self Care | Payer: Medicaid Other | Source: Ambulatory Visit | Attending: Radiation Oncology | Admitting: Radiation Oncology

## 2021-06-22 DIAGNOSIS — Z510 Encounter for antineoplastic radiation therapy: Secondary | ICD-10-CM | POA: Diagnosis not present

## 2021-06-25 ENCOUNTER — Ambulatory Visit
Admission: RE | Admit: 2021-06-25 | Discharge: 2021-06-25 | Disposition: A | Discharge status: Home / Self Care | Payer: Medicaid Other | Source: Ambulatory Visit | Attending: Radiation Oncology | Admitting: Radiation Oncology

## 2021-06-25 ENCOUNTER — Other Ambulatory Visit: Payer: Self-pay

## 2021-06-25 DIAGNOSIS — Z510 Encounter for antineoplastic radiation therapy: Secondary | ICD-10-CM | POA: Diagnosis not present

## 2021-06-26 ENCOUNTER — Ambulatory Visit
Admission: RE | Admit: 2021-06-26 | Discharge: 2021-06-26 | Disposition: A | Discharge status: Home / Self Care | Payer: Medicaid Other | Source: Ambulatory Visit | Attending: Radiation Oncology | Admitting: Radiation Oncology

## 2021-06-26 DIAGNOSIS — Z510 Encounter for antineoplastic radiation therapy: Secondary | ICD-10-CM | POA: Diagnosis not present

## 2021-06-27 ENCOUNTER — Ambulatory Visit (HOSPITAL_COMMUNITY): Payer: Medicaid Other | Admitting: Physical Therapy

## 2021-06-27 ENCOUNTER — Ambulatory Visit
Admission: RE | Admit: 2021-06-27 | Discharge: 2021-06-27 | Disposition: A | Discharge status: Home / Self Care | Payer: Medicaid Other | Source: Ambulatory Visit | Attending: Radiation Oncology | Admitting: Radiation Oncology

## 2021-06-27 ENCOUNTER — Other Ambulatory Visit: Payer: Self-pay

## 2021-06-27 ENCOUNTER — Encounter: Payer: Self-pay | Admitting: Radiation Oncology

## 2021-06-27 ENCOUNTER — Ambulatory Visit: Payer: Medicaid Other

## 2021-06-27 DIAGNOSIS — R2689 Other abnormalities of gait and mobility: Secondary | ICD-10-CM

## 2021-06-27 DIAGNOSIS — R42 Dizziness and giddiness: Secondary | ICD-10-CM

## 2021-06-27 DIAGNOSIS — Z510 Encounter for antineoplastic radiation therapy: Secondary | ICD-10-CM | POA: Diagnosis not present
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
## 2021-06-28 ENCOUNTER — Ambulatory Visit: Payer: Medicaid Other

## 2021-06-29 ENCOUNTER — Other Ambulatory Visit: Payer: Self-pay

## 2021-06-29 ENCOUNTER — Encounter: Payer: Self-pay | Admitting: Urology

## 2021-06-29 ENCOUNTER — Ambulatory Visit: Payer: Medicaid Other

## 2021-06-29 ENCOUNTER — Ambulatory Visit: Payer: Medicaid Other | Admitting: Urology

## 2021-06-29 VITALS — BP 67/54 | HR 82

## 2021-06-29 DIAGNOSIS — N1339 Other hydronephrosis: Secondary | ICD-10-CM

## 2021-06-29 LAB — URINALYSIS, ROUTINE W REFLEX MICROSCOPIC
Bilirubin, UA: NEGATIVE
Glucose, UA: NEGATIVE
Ketones, UA: NEGATIVE
Nitrite, UA: POSITIVE — AB
Specific Gravity, UA: 1.015 (ref 1.005–1.030)
Urobilinogen, Ur: 0.2 mg/dL (ref 0.2–1.0)
pH, UA: 6 (ref 5.0–7.5)

## 2021-06-29 LAB — MICROSCOPIC EXAMINATION
RBC, Urine: >30 /hpf — AB (ref 0–2)
Renal Epithel, UA: NONE SEEN /hpf
WBC, UA: >30 /hpf — AB (ref 0–5)
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
## 2021-06-29 NOTE — Patient Instructions (Signed)
Hydronephrosis ?Hydronephrosis is the swelling of one or both kidneys due to a blockage that stops urine from flowing out of the body. Kidneys filter waste from the blood and produce urine. This condition can lead to kidney failure and may become life-threatening if not treated promptly. ?What are the causes? ?In infants and children, common causes include problems that occur when a baby is developing in the womb. These can include problems in the kidneys or in the tubes that drain urine into the bladder (ureters). ?In adults, common causes include: ?Kidney stones. ?Pregnancy. ?A tumor or cyst in the abdomen or pelvis. ?An enlarged prostate gland. ?Other causes include: ?Bladder infection. ?Scar tissue from a previous surgery or injury. ?A blood clot. ?Cancer of the prostate, bladder, uterus, ovary, or colon. ?What are the signs or symptoms? ?Symptoms of this condition include: ?Pain or discomfort in your side (flank) or abdomen. ?Swelling in your abdomen. ?Nausea and vomiting. ?Fever. ?Pain when passing urine. ?Feelings of urgency when you need to urinate. ?Urinating more often than normal. ?In some cases, you may not have any symptoms. ?How is this diagnosed? ?This condition may be diagnosed based on: ?Your symptoms and medical history. ?A physical exam. ?Blood and urine tests. ?Imaging tests, such as an ultrasound, CT scan, or MRI. ?A procedure to look at your urinary tract and bladder by inserting a scope into the urethra (cystoscopy). ?How is this treated? ?Treatment for this condition depends on where the blockage is, how long it has been there, and what caused it. The goal of treatment is to remove the blockage. Treatment may include: ?Antibiotic medicines to treat or prevent infection. ?A procedure to place a small, thin tube (stent) into a blocked ureter. The stent will keep the ureter open so that urine can drain through it. ?A nonsurgical procedure that crushes kidney stones with shock waves  (extracorporeal shock wave lithotripsy). ?If kidney failure occurs, treatment may include dialysis or a kidney transplant. ?Follow these instructions at home: ? ?Take over-the-counter and prescription medicines only as told by your health care provider. ?If you were prescribed an antibiotic medicine, take it exactly as told by your health care provider. Do not stop taking the antibiotic even if you start to feel better. ?Rest and return to your normal activities as told by your health care provider. Ask your health care provider what activities are safe for you. ?Drink enough fluid to keep your urine pale yellow. ?Keep all follow-up visits. This is important. ?Contact a health care provider if: ?You continue to have symptoms after treatment. ?You develop new symptoms. ?Your urine becomes cloudy or bloody. ?You have a fever. ?Get help right away if: ?You have severe flank or abdominal pain. ?You cannot drink fluids without vomiting. ?Summary ?Hydronephrosis is the swelling of one or both kidneys due to a blockage that stops urine from flowing out of the body. ?Hydronephrosis can lead to kidney failure and may become life-threatening if not treated promptly. ?The goal of treatment is to remove the blockage. It may include a procedure to insert a stent into a blocked ureter, a procedure to break up kidney stones, or taking antibiotic medicines. ?Follow your health care provider's instructions for taking care of yourself at home, including instructions about drinking fluids, taking medicines, and limiting activities. ?This information is not intended to replace advice given to you by your health care provider. Make sure you discuss any questions you have with your health care provider. ?Document Revised: 07/20/2019 Document Reviewed: 07/20/2019 ?Elsevier Patient   Education ? 2022 [employer] Inc. ? ?

## 2021-07-02 ENCOUNTER — Ambulatory Visit: Payer: Medicaid Other

## 2021-07-03 ENCOUNTER — Ambulatory Visit: Payer: Medicaid Other

## 2021-07-04 ENCOUNTER — Ambulatory Visit (HOSPITAL_COMMUNITY): Payer: Medicaid Other

## 2021-07-04 ENCOUNTER — Encounter (HOSPITAL_COMMUNITY): Payer: Self-pay

## 2021-07-04 ENCOUNTER — Other Ambulatory Visit: Payer: Self-pay

## 2021-07-04 ENCOUNTER — Ambulatory Visit: Payer: Medicaid Other

## 2021-07-04 DIAGNOSIS — R42 Dizziness and giddiness: Secondary | ICD-10-CM | POA: Diagnosis present

## 2021-07-04 DIAGNOSIS — A419 Sepsis, unspecified organism: Secondary | ICD-10-CM | POA: Insufficient documentation

## 2021-07-04 DIAGNOSIS — R2689 Other abnormalities of gait and mobility: Secondary | ICD-10-CM | POA: Diagnosis present
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
## 2021-07-05 ENCOUNTER — Ambulatory Visit: Payer: Medicaid Other

## 2021-07-06 ENCOUNTER — Ambulatory Visit: Payer: Medicaid Other

## 2021-07-09 ENCOUNTER — Ambulatory Visit (INDEPENDENT_AMBULATORY_CARE_PROVIDER_SITE_OTHER): Payer: Medicaid Other | Admitting: *Deleted

## 2021-07-09 ENCOUNTER — Ambulatory Visit: Payer: Medicaid Other | Admitting: Radiation Oncology

## 2021-07-09 DIAGNOSIS — I4891 Unspecified atrial fibrillation: Secondary | ICD-10-CM | POA: Diagnosis not present

## 2021-07-09 DIAGNOSIS — I2699 Other pulmonary embolism without acute cor pulmonale: Secondary | ICD-10-CM | POA: Diagnosis not present

## 2021-07-09 DIAGNOSIS — Z5181 Encounter for therapeutic drug level monitoring: Secondary | ICD-10-CM | POA: Diagnosis not present

## 2021-07-09 LAB — POCT INR: INR: 4.5 — AB (ref 2.0–3.0)

## 2021-07-09 NOTE — Patient Instructions (Signed)
Hold warfarin tonight, take 1 tablet tomorrow night then resume 2 tablets daily except 3 tablets on Sundays ?Recheck in 2 wks ?

## 2021-07-10 ENCOUNTER — Ambulatory Visit: Payer: Medicaid Other
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
## 2021-07-11 ENCOUNTER — Ambulatory Visit (HOSPITAL_COMMUNITY): Payer: Medicaid Other

## 2021-07-11 ENCOUNTER — Ambulatory Visit: Payer: Medicaid Other

## 2021-07-11 ENCOUNTER — Other Ambulatory Visit: Payer: Self-pay

## 2021-07-11 DIAGNOSIS — R2689 Other abnormalities of gait and mobility: Secondary | ICD-10-CM

## 2021-07-11 DIAGNOSIS — R42 Dizziness and giddiness: Secondary | ICD-10-CM | POA: Diagnosis not present

## 2021-07-12 ENCOUNTER — Other Ambulatory Visit (HOSPITAL_COMMUNITY): Payer: Self-pay | Admitting: Family Medicine

## 2021-07-12 ENCOUNTER — Ambulatory Visit: Payer: Medicaid Other

## 2021-07-13 ENCOUNTER — Ambulatory Visit: Payer: Medicaid Other

## 2021-07-16 ENCOUNTER — Other Ambulatory Visit (HOSPITAL_COMMUNITY): Payer: Self-pay | Admitting: Interventional Radiology

## 2021-07-16 ENCOUNTER — Other Ambulatory Visit: Payer: Self-pay | Admitting: Urology

## 2021-07-16 ENCOUNTER — Ambulatory Visit (HOSPITAL_COMMUNITY)
Admission: RE | Admit: 2021-07-16 | Discharge: 2021-07-16 | Disposition: A | Discharge status: Home / Self Care | Payer: Medicaid Other | Source: Ambulatory Visit | Attending: Urology | Admitting: Urology

## 2021-07-16 DIAGNOSIS — Z436 Encounter for attention to other artificial openings of urinary tract: Secondary | ICD-10-CM | POA: Diagnosis present

## 2021-07-16 DIAGNOSIS — N1339 Other hydronephrosis: Secondary | ICD-10-CM

## 2021-07-16 HISTORY — PX: IR NEPHROSTOMY EXCHANGE LEFT: IMG6069

## 2021-07-16 HISTORY — PX: IR NEPHROSTOMY EXCHANGE RIGHT: IMG6070

## 2021-07-16 MED ORDER — LIDOCAINE-EPINEPHRINE 1 %-1:100000 IJ SOLN
INTRAMUSCULAR | Status: AC
  Filled 2021-07-16: qty 1

## 2021-07-16 MED ORDER — LIDOCAINE HCL (PF) 1 % IJ SOLN
INTRAMUSCULAR | Status: DC | PRN
  Administered 2021-07-16: 5 mL

## 2021-07-18 ENCOUNTER — Ambulatory Visit (HOSPITAL_COMMUNITY): Payer: Medicaid Other | Attending: Internal Medicine | Admitting: Physical Therapy

## 2021-07-23 ENCOUNTER — Ambulatory Visit (INDEPENDENT_AMBULATORY_CARE_PROVIDER_SITE_OTHER): Payer: Medicaid Other | Admitting: *Deleted

## 2021-07-23 DIAGNOSIS — Z5181 Encounter for therapeutic drug level monitoring: Secondary | ICD-10-CM | POA: Diagnosis not present

## 2021-07-23 DIAGNOSIS — I4891 Unspecified atrial fibrillation: Secondary | ICD-10-CM | POA: Diagnosis not present

## 2021-07-23 DIAGNOSIS — I2699 Other pulmonary embolism without acute cor pulmonale: Secondary | ICD-10-CM

## 2021-07-23 LAB — POCT INR: INR: 4.6 — AB (ref 2.0–3.0)

## 2021-07-23 NOTE — Patient Instructions (Signed)
Hold warfarin tonight then decrease dose to 2 tablets daily except 1 tablet on Tuesdays and Fridays ?Recheck in 2 wks ?

## 2021-07-24 ENCOUNTER — Encounter: Payer: Self-pay | Admitting: Radiology

## 2021-07-25 ENCOUNTER — Encounter (HOSPITAL_COMMUNITY): Payer: Medicaid Other

## 2021-07-27 ENCOUNTER — Other Ambulatory Visit: Payer: Self-pay

## 2021-07-27 ENCOUNTER — Ambulatory Visit (HOSPITAL_COMMUNITY)
Admission: RE | Admit: 2021-07-27 | Discharge: 2021-07-27 | Disposition: A | Discharge status: Home / Self Care | Payer: Medicaid Other | Source: Ambulatory Visit | Attending: Hematology and Oncology | Admitting: Hematology and Oncology

## 2021-07-27 ENCOUNTER — Inpatient Hospital Stay: Payer: Medicaid Other | Attending: Gynecologic Oncology

## 2021-07-27 DIAGNOSIS — C539 Malignant neoplasm of cervix uteri, unspecified: Secondary | ICD-10-CM | POA: Insufficient documentation

## 2021-07-27 DIAGNOSIS — C778 Secondary and unspecified malignant neoplasm of lymph nodes of multiple regions: Secondary | ICD-10-CM | POA: Insufficient documentation

## 2021-07-27 DIAGNOSIS — Z923 Personal history of irradiation: Secondary | ICD-10-CM | POA: Insufficient documentation

## 2021-07-27 DIAGNOSIS — N184 Chronic kidney disease, stage 4 (severe): Secondary | ICD-10-CM | POA: Insufficient documentation

## 2021-07-27 DIAGNOSIS — Z7901 Long term (current) use of anticoagulants: Secondary | ICD-10-CM | POA: Insufficient documentation

## 2021-07-27 DIAGNOSIS — N1330 Unspecified hydronephrosis: Secondary | ICD-10-CM | POA: Insufficient documentation

## 2021-07-27 DIAGNOSIS — C786 Secondary malignant neoplasm of retroperitoneum and peritoneum: Secondary | ICD-10-CM | POA: Insufficient documentation

## 2021-07-27 DIAGNOSIS — Z86711 Personal history of pulmonary embolism: Secondary | ICD-10-CM | POA: Insufficient documentation

## 2021-07-27 DIAGNOSIS — R188 Other ascites: Secondary | ICD-10-CM | POA: Insufficient documentation

## 2021-07-27 DIAGNOSIS — K760 Fatty (change of) liver, not elsewhere classified: Secondary | ICD-10-CM | POA: Insufficient documentation

## 2021-07-27 DIAGNOSIS — I6523 Occlusion and stenosis of bilateral carotid arteries: Secondary | ICD-10-CM | POA: Insufficient documentation

## 2021-07-27 DIAGNOSIS — Z79899 Other long term (current) drug therapy: Secondary | ICD-10-CM | POA: Insufficient documentation

## 2021-07-27 LAB — CBC WITH DIFFERENTIAL (CANCER CENTER ONLY)
Abs Immature Granulocytes: 0.03 10*3/uL (ref 0.00–0.07)
Basophils Absolute: 0 10*3/uL (ref 0.0–0.1)
Basophils Relative: 0 %
Eosinophils Absolute: 0 10*3/uL (ref 0.0–0.5)
Eosinophils Relative: 1 %
HCT: 34 % — ABNORMAL LOW (ref 36.0–46.0)
Hemoglobin: 10.9 g/dL — ABNORMAL LOW (ref 12.0–15.0)
Immature Granulocytes: 1 %
Lymphocytes Relative: 23 %
Lymphs Abs: 1.3 10*3/uL (ref 0.7–4.0)
MCH: 28.2 pg (ref 26.0–34.0)
MCHC: 32.1 g/dL (ref 30.0–36.0)
MCV: 88.1 fL (ref 80.0–100.0)
Monocytes Absolute: 0.6 10*3/uL (ref 0.1–1.0)
Monocytes Relative: 11 %
Neutro Abs: 3.7 10*3/uL (ref 1.7–7.7)
Neutrophils Relative %: 64 %
Platelet Count: 319 10*3/uL (ref 150–400)
RBC: 3.86 MIL/uL — ABNORMAL LOW (ref 3.87–5.11)
RDW: 17.1 % — ABNORMAL HIGH (ref 11.5–15.5)
WBC Count: 5.6 10*3/uL (ref 4.0–10.5)
nRBC: 0 % (ref 0.0–0.2)

## 2021-07-27 LAB — CMP (CANCER CENTER ONLY)
ALT: 10 U/L (ref 0–44)
AST: 15 U/L (ref 15–41)
Albumin: 2.8 g/dL — ABNORMAL LOW (ref 3.5–5.0)
Alkaline Phosphatase: 117 U/L (ref 38–126)
Anion gap: 5 (ref 5–15)
BUN: 9 mg/dL (ref 8–23)
CO2: 28 mmol/L (ref 22–32)
Calcium: 8.5 mg/dL — ABNORMAL LOW (ref 8.9–10.3)
Chloride: 106 mmol/L (ref 98–111)
Creatinine: 1.45 mg/dL — ABNORMAL HIGH (ref 0.44–1.00)
GFR, Estimated: 41 mL/min — ABNORMAL LOW (ref >60)
Glucose, Bld: 96 mg/dL (ref 70–99)
Potassium: 3.4 mmol/L — ABNORMAL LOW (ref 3.5–5.1)
Sodium: 139 mmol/L (ref 135–145)
Total Bilirubin: 0.3 mg/dL (ref 0.3–1.2)
Total Protein: 6.8 g/dL (ref 6.5–8.1)

## 2021-07-27 LAB — MAGNESIUM: Magnesium: 1.4 mg/dL — ABNORMAL LOW (ref 1.7–2.4)

## 2021-07-27 LAB — GLUCOSE, CAPILLARY: Glucose-Capillary: 91 mg/dL (ref 70–99)

## 2021-07-27 MED ORDER — FLUDEOXYGLUCOSE F - 18 (FDG) INJECTION
13.1400 | Freq: Once | INTRAVENOUS | Status: AC | PRN
Start: 2021-07-27 — End: 2021-07-27
  Administered 2021-07-27: 13.14 via INTRAVENOUS

## 2021-07-27 NOTE — Progress Notes (Incomplete)
?  Radiation Oncology         [phone] ?________________________________ ? ?Patient Name: Debbie [name] ?MRN: [id number] ?DOB: 11-23-1960 ?Referring Physician: Jeral Pinch ?Date of Service: 06/27/2021 ?[facility] [place] ? ?                                                      End Of Treatment Note ? ?Diagnoses: C53.9-Malignant neoplasm of cervix uteri, unspecified ? 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 ? ?Intent: Curative ? ?Radiation Treatment Dates: 06/04/2021 through 06/27/2021 ?Site Technique Total Dose (Gy) Dose per Fx (Gy) Completed Fx Beam Energies  ?Pelvis: Pelvis IMRT 18/18 1.8 10/10 6X  ?Pelvis: Pelvis_RxChng IMRT 24/24 3 8/8 6X  ? ?Narrative: The patient tolerated radiation therapy relatively well. During her final weekly treatment check on 06/25/21, the patient denied any other symptoms other than some mild skin irritation.  ? ?Plan: The patient will follow-up with radiation oncology in one month . ? ?________________________________________________ ?----------------------------------- ? ?Blair Promise, PhD, MD ? ?This document serves as a record of services personally performed by Gery Pray, MD. It was created on his behalf by Roney Mans, a trained medical scribe. The creation of this record is based on the scribe's personal observations and the provider's statements to them. This document has been checked and approved by the attending provider. ? ?

## 2021-07-29 NOTE — Progress Notes (Signed)
?Radiation Oncology         ([phone]) [phone] ?________________________________ ? ?Name: Debbie [name] MRN: [id number]  ?Date: 07/30/2021  DOB: 06-13-1960 ? ?Follow-Up Visit Note ? ?CC: [facility], [facility]  Lafonda Mosses, MD ? ?  ICD-10-CM   ?1. Malignant neoplasm of cervix, unspecified site [facility])  C53.9 Urine culture  ?  Urinalysis, Complete w Microscopic  ?  ? ? 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 ? ?Interval Since Last Radiation: 1 month and 2 days  ? ?Intent: Curative ? ?Radiation Treatment Dates: 06/04/2021 through 06/27/2021 ?Site Technique Total Dose (Gy) Dose per Fx (Gy) Completed Fx Beam Energies  ?Pelvis: Pelvis IMRT 18/18 1.8 10/10 6X  ?Pelvis: Pelvis_RxChng IMRT 24/24 3 8/8 6X  ? ? ?Narrative:  The patient returns today for routine follow-up. The patient tolerated radiation therapy relatively well. During her final weekly treatment check on 06/25/21, the patient denied any other symptoms other than some mild skin irritation.  ? ?Since her initial consultation on 05/15/21, the patient was hospitalized on 05/19/21 through 05/23/21 for management of leakage from her left nephrostomy tube site. Upon arrival to the ED, she was also found to have sepsis from a UTI due to dislodgment of the left nephrostomy tube to the subcutaneous tissue. CT of the abdomen and pelvis performed on 05/19/21 showed left greater than right hydronephrosis, and retraction of the left sided nephrostomy tube into the superficial soft tissue overlying the left flank. The right-sided tube appeared to be positioned within the inferior pole cortex of the right kidney, and not clearly collecting / performing properly. CT also showed some known numerous enlarged retroperitoneal and iliac lymph nodes as unchanged in the interval.  Subsequently, the bilateral nephrostomy tubes were exchanged/replaced on 05/20/21. For her sepsis, the patient was treated with cefepime, which was  transitioned to Rocephin for a few days. At discharge, she was transitioned to oral antibiotics; 7 day course.      ? ?PET on 06/05/21 showed signs of cervical cancer with nodal involvement in the chest and abdomen, and evidence of diffuse peritoneal disease. Findings of potential early bony involvement were suspected based on PET findings, particularly in the thoracic ?spine but without substantial increased metabolic activity associated with a subtle sclerotic lesion at T2, and without CT correlate at areas of heterogeneity elsewhere. Otherwise, CT findings were largely similar with respect to intra-abdominal findings when compared to recent imaging. Other findings of potential clinical significance noted on PET included: increased metabolic activity about the anal canal (favored to be physiologic); sclerosis about the iliac and pubic bones favored to be related osteitis; and signs/features suggestive of a prior left frontal craniotomy and presumed underlying encephalomalacia ? ?While undergoing RT, the patient followed up with Dr. Alvy Bimler on 06/19/21. Given her multiple comorbidities, Dr. Alvy Bimler did not advise conventional systemic treatment, and recommended repeat imaging 4-6 week after completing RT, followed by re-consideration of treatment options.  (Dr. Alvy Bimler also presented the patient's case at the tumor board who concurred with this decision).        ? ?Recent restaging PET performed on 07/27/21 showed findings consistent with an overall good response to therapy,  with significant improvement seen in previously demonstrated hypermetabolic activity associated with the uterus and cervix, as well as multiple retroperitoneal lymph nodes. However, findings consistent with persistent hypermetabolic peritoneal metastatic disease were appreciated without substantial improvement. A similar appearing hypermetabolic lymph node was also noted at the left thoracic inlet. No  other evidence of thoracic metastatic  disease was appreciated on PET. Other findings of potential clinical significance on PET included indeterminate asymmetric activity in the nasopharynx and piriformis sinuses, noted as potentially inflammatory in etiology. ? ?Of note: the patient continues to meet with OP PT/rehab for her generalized weakness and bladder issues.  ? ?She denies any vaginal bleeding. ?  ? ?Allergies:  has No Known Allergies. ? ?Meds: ?Current Outpatient Medications  ?Medication Sig Dispense Refill  ? acetaminophen (TYLENOL) 325 MG tablet Take 2 tablets (650 mg total) by mouth every 6 (six) hours as needed for mild pain or moderate pain.    ? atenolol (TENORMIN) 50 MG tablet Take 50 mg by mouth daily.    ? cholecalciferol (VITAMIN D3) 25 MCG (1000 UT) tablet Take 1,000 Units by mouth daily.    ? folic acid (FOLVITE) 1 MG tablet Take 1 tablet (1 mg total) by mouth daily. 30 tablet 0  ? hydrocortisone 2.5 % cream Apply 1 application topically daily as needed (itch/rash).    ? oxybutynin (DITROPAN-XL) 10 MG 24 hr tablet Take 10 mg by mouth daily.    ? PHENobarbital (LUMINAL) 97.2 MG tablet Take 97.2 mg by mouth 2 (two) times daily.    ? warfarin (COUMADIN) 5 MG tablet TAKE 1-2 TABLETS BY MOUTH ONCE DAILY AS DIRECTED BY COUMADIN CLINIC 60 tablet 6  ? hydrochlorothiazide (HYDRODIURIL) 12.5 MG tablet Take 12.5 mg by mouth daily. (Patient not taking: Reported on 07/30/2021)    ? ?No current facility-administered medications for this encounter.  ? ? ?Physical Findings: ?The patient is in no acute distress. Patient is alert and oriented. ? height is '5\' 2"'$  (1.575 m) and weight is 273 lb 6.4 oz (124 kg). Her temperature is 97 ?F (36.1 ?C) (abnormal). Her blood pressure is 96/86 and her pulse is 98. Her respiration is 20 and oxygen saturation is 100%. .  No significant changes. Lungs are clear to auscultation bilaterally. Heart has regular rate and rhythm. No palpable cervical, supraclavicular, or axillary adenopathy. Abdomen soft, non-tender,  normal bowel sounds.  Skin is healed well in the abdominal region.  Nephrostomy sites show no signs of infection.  Small approximately 5 mm opening inferior to the left nephrostomy tube site which was her previous site of skin entrance.  Urine in the left nephrostomy and collection bag shows a purpleish tint.  No obvious blood.  Right nephrostomy tube is draining yellow-colored urine. ? ? ?Lab Findings: ?Lab Results  ?Component Value Date  ? WBC 5.6 07/27/2021  ? HGB 10.9 (L) 07/27/2021  ? HCT 34.0 (L) 07/27/2021  ? MCV 88.1 07/27/2021  ? PLT 319 07/27/2021  ? ? ?Radiographic Findings: ?NM PET Image Restage (PS) Skull Base to Thigh (F-18 FDG) ? ?Result Date: 07/29/2021 ?CLINICAL DATA:  Subsequent treatment strategy for cervical cancer post radiation therapy. EXAM: NUCLEAR MEDICINE PET SKULL BASE TO THIGH TECHNIQUE: 13.14 mCi F-18 FDG was injected intravenously. Full-ring PET imaging was performed from the skull base to thigh after the radiotracer. CT data was obtained and used for attenuation correction and anatomic localization. Fasting blood glucose: 91 mg/dl COMPARISON:  PET-CT 06/05/2021. Abdominal CT 05/19/2021 and abdominopelvic CT 04/18/2021. FINDINGS: Mediastinal blood pool activity: SUV max 2.8 NECK: No hypermetabolic cervical lymph nodes are identified.Nonspecific asymmetric increased metabolic activity in the left nasopharynx and right piriform sinus without clear corresponding CT abnormality. Incidental CT findings: Previous left frontotemporal craniotomy. Right ocular prosthesis and bilateral carotid atherosclerosis. CHEST: Similar hypermetabolic node at the left thoracic  inlet, measuring 11 mm on image 40/4 (SUV max 6.0, previously 5.7). No other hypermetabolic mediastinal, hilar or axillary lymph nodes. No hypermetabolic pulmonary activity or suspicious nodularity. Incidental CT findings: Mild atherosclerosis of the aorta, great vessels and coronary arteries. ABDOMEN/PELVIS: The previously demonstrated  extensive hypermetabolic activity associated with the cervix and uterus has significantly decreased in extent as well as intensity, although the latter is difficult to evaluate due to proximity with the bladder.

## 2021-07-30 ENCOUNTER — Ambulatory Visit: Payer: Medicaid Other | Attending: Radiation Oncology

## 2021-07-30 ENCOUNTER — Encounter: Payer: Self-pay | Admitting: Radiation Oncology

## 2021-07-30 ENCOUNTER — Other Ambulatory Visit: Payer: Self-pay | Admitting: Cardiology

## 2021-07-30 ENCOUNTER — Encounter: Payer: Self-pay | Admitting: Hematology and Oncology

## 2021-07-30 ENCOUNTER — Other Ambulatory Visit: Payer: Self-pay

## 2021-07-30 ENCOUNTER — Inpatient Hospital Stay: Payer: Medicaid Other | Admitting: Hematology and Oncology

## 2021-07-30 ENCOUNTER — Ambulatory Visit
Admission: RE | Admit: 2021-07-30 | Discharge: 2021-07-30 | Disposition: A | Discharge status: Home / Self Care | Payer: Medicaid Other | Source: Ambulatory Visit | Attending: Radiation Oncology | Admitting: Radiation Oncology

## 2021-07-30 VITALS — BP 96/86 | HR 98 | Temp 97.0°F | Resp 20 | Ht 62.0 in | Wt 273.4 lb

## 2021-07-30 DIAGNOSIS — C778 Secondary and unspecified malignant neoplasm of lymph nodes of multiple regions: Secondary | ICD-10-CM | POA: Diagnosis not present

## 2021-07-30 DIAGNOSIS — N1330 Unspecified hydronephrosis: Secondary | ICD-10-CM | POA: Diagnosis not present

## 2021-07-30 DIAGNOSIS — I6523 Occlusion and stenosis of bilateral carotid arteries: Secondary | ICD-10-CM | POA: Insufficient documentation

## 2021-07-30 DIAGNOSIS — Z79899 Other long term (current) drug therapy: Secondary | ICD-10-CM | POA: Insufficient documentation

## 2021-07-30 DIAGNOSIS — I4891 Unspecified atrial fibrillation: Secondary | ICD-10-CM

## 2021-07-30 DIAGNOSIS — C786 Secondary malignant neoplasm of retroperitoneum and peritoneum: Secondary | ICD-10-CM | POA: Diagnosis present

## 2021-07-30 DIAGNOSIS — I959 Hypotension, unspecified: Secondary | ICD-10-CM | POA: Insufficient documentation

## 2021-07-30 DIAGNOSIS — N184 Chronic kidney disease, stage 4 (severe): Secondary | ICD-10-CM | POA: Insufficient documentation

## 2021-07-30 DIAGNOSIS — C539 Malignant neoplasm of cervix uteri, unspecified: Secondary | ICD-10-CM

## 2021-07-30 DIAGNOSIS — Z86711 Personal history of pulmonary embolism: Secondary | ICD-10-CM | POA: Diagnosis not present

## 2021-07-30 DIAGNOSIS — Z923 Personal history of irradiation: Secondary | ICD-10-CM | POA: Insufficient documentation

## 2021-07-30 DIAGNOSIS — K760 Fatty (change of) liver, not elsewhere classified: Secondary | ICD-10-CM | POA: Insufficient documentation

## 2021-07-30 DIAGNOSIS — Z510 Encounter for antineoplastic radiation therapy: Secondary | ICD-10-CM | POA: Insufficient documentation

## 2021-07-30 DIAGNOSIS — R197 Diarrhea, unspecified: Secondary | ICD-10-CM | POA: Insufficient documentation

## 2021-07-30 DIAGNOSIS — Z7189 Other specified counseling: Secondary | ICD-10-CM | POA: Diagnosis not present

## 2021-07-30 DIAGNOSIS — Z7901 Long term (current) use of anticoagulants: Secondary | ICD-10-CM | POA: Insufficient documentation

## 2021-07-30 DIAGNOSIS — R188 Other ascites: Secondary | ICD-10-CM | POA: Diagnosis not present

## 2021-07-30 LAB — URINALYSIS, COMPLETE (UACMP) WITH MICROSCOPIC
Bilirubin Urine: NEGATIVE
Glucose, UA: NEGATIVE mg/dL
Ketones, ur: NEGATIVE mg/dL
Nitrite: NEGATIVE
Protein, ur: 100 mg/dL — AB
Specific Gravity, Urine: 1.009 (ref 1.005–1.030)
WBC, UA: >50 WBC/hpf — ABNORMAL HIGH (ref 0–5)
pH: 9 — ABNORMAL HIGH (ref 5.0–8.0)

## 2021-07-30 NOTE — Assessment & Plan Note (Signed)
This has improved recently but remains a major concern due to hydronephrosis and risks of progression to renal failure ?

## 2021-07-30 NOTE — Assessment & Plan Note (Signed)
I have reviewed multiple imaging studies with her and her brother ?She had great response to radiation but it was incomplete ?She needs chemotherapy but I am very concerned about her major co-morbidities, despite recent improvement of her renal function ?We discussed options including single agent cisplatin, combination cisplatin with taxol or palliative care ?I would not offer bevacizumab due to history of PE ?PD-L 1 score was 0% ?We discussed side-effects of each option and she is undecided ?I will call her end of the week for final decision ?

## 2021-07-30 NOTE — Assessment & Plan Note (Signed)
She will remain on anticoagulation indefinitely as directed by cardiology clinic ?

## 2021-07-30 NOTE — Progress Notes (Signed)
[place] ?OFFICE PROGRESS NOTE ? ?Patient Care Team: ?Health, [facility] as PCP - General ?Branch, Alphonse Guild, MD as PCP - Cardiology (Cardiology) ? ?ASSESSMENT & PLAN:  ?Malignant neoplasm of cervix ([place]) ?I have reviewed multiple imaging studies with her and her brother ?She had great response to radiation but it was incomplete ?She needs chemotherapy but I am very concerned about her major co-morbidities, despite recent improvement of her renal function ?We discussed options including single agent cisplatin, combination cisplatin with taxol or palliative care ?I would not offer bevacizumab due to history of PE ?PD-L 1 score was 0% ?We discussed side-effects of each option and she is undecided ?I will call her end of the week for final decision ? ?Chronic kidney disease (CKD), stage IV (severe) (HCC) ?This has improved recently but remains a major concern due to hydronephrosis and risks of progression to renal failure ? ?History of pulmonary embolism ?She will remain on anticoagulation indefinitely as directed by cardiology clinic ? ?Goals of care, counseling/discussion ?We discussed prognosis with and without treatment ?She has major serious co-morbidities ?Her disease is incurable and treatment goal is strictly palliative ? ?No orders of the defined types were placed in this encounter. ? ? ?All questions were answered. The patient knows to call the clinic with any problems, questions or concerns. ?The total time spent in the appointment was 40 minutes encounter with patients including review of chart and various tests results, discussions about plan of care and coordination of care plan ?  ?Heath Lark, MD ?07/30/2021 6:35 PM ? ?INTERVAL HISTORY: ?Please see below for problem oriented charting. ?she returns for test results with her brother, Elberta Fortis ?She is doing ok, no recent vaginal bleeding or pain ? ?REVIEW OF SYSTEMS:   ?Constitutional: Denies fevers, chills or abnormal weight  loss ?Eyes: Denies blurriness of vision ?Ears, nose, mouth, throat, and face: Denies mucositis or sore throat ?Respiratory: Denies cough, dyspnea or wheezes ?Cardiovascular: Denies palpitation, chest discomfort or lower extremity swelling ?Gastrointestinal:  Denies nausea, heartburn or change in bowel habits ?Skin: Denies abnormal skin rashes ?Lymphatics: Denies new lymphadenopathy or easy bruising ?Neurological:Denies numbness, tingling or new weaknesses ?Behavioral/Psych: Mood is stable, no new changes  ?All other systems were reviewed with the patient and are negative. ? ?I have reviewed the past medical history, past surgical history, social history and family history with the patient and they are unchanged from previous note. ? ?ALLERGIES:  has No Known Allergies. ? ?MEDICATIONS:  ?Current Outpatient Medications  ?Medication Sig Dispense Refill  ? acetaminophen (TYLENOL) 325 MG tablet Take 2 tablets (650 mg total) by mouth every 6 (six) hours as needed for mild pain or moderate pain.    ? atenolol (TENORMIN) 50 MG tablet Take 50 mg by mouth daily.    ? cholecalciferol (VITAMIN D3) 25 MCG (1000 UT) tablet Take 1,000 Units by mouth daily.    ? folic acid (FOLVITE) 1 MG tablet Take 1 tablet (1 mg total) by mouth daily. 30 tablet 0  ? hydrochlorothiazide (HYDRODIURIL) 12.5 MG tablet Take 12.5 mg by mouth daily. (Patient not taking: Reported on 07/30/2021)    ? hydrocortisone 2.5 % cream Apply 1 application topically daily as needed (itch/rash).    ? oxybutynin (DITROPAN-XL) 10 MG 24 hr tablet Take 10 mg by mouth daily.    ? PHENobarbital (LUMINAL) 97.2 MG tablet Take 97.2 mg by mouth 2 (two) times daily.    ? warfarin (COUMADIN) 5 MG tablet TAKE 1-2 TABLETS BY  MOUTH ONCE DAILY AS DIRECTED BY COUMADIN CLINIC 60 tablet 6  ? ?No current facility-administered medications for this visit.  ? ? ?SUMMARY OF ONCOLOGIC HISTORY: ?Oncology History Overview Note  ?PD-L1 CPS: 0% ?  ?Malignant neoplasm of cervix ([place])  ?04/18/2021  Imaging  ? CT abdomen and pelvis WO contrast ? ?1. Bilateral hydronephrosis and hydroureter, without clear cause for obstruction. No urinary tract calculi. ?2. Enlarged uterus, with minimal high attenuation within the endometrial cavity, which could reflect blood products. Given clinical history of vaginal bleeding, follow-up pelvic ultrasound may be useful. ?3. Retroperitoneal lymphadenopathy. ?4. High attenuation material dependently within the gallbladder, consistent with noncalcified gallstones or sludge. No evidence of acute cholecystitis. ?5.  Aortic Atherosclerosis (ICD10-I70.0). ?  ?  ?04/19/2021 Imaging  ? US pelvis ?Heterogeneous uterus without appreciable mass. Prominent endometrium in this post menopausal woman. Further evaluation with MRI examination or direct visualization is recommended. ?  ?Bilateral ovaries were not seen. ?  ?Exam is limited due to patient's inability to tolerate transvaginal examination. ?  ?  ?04/19/2021 Pathology Results  ? A. BLADDER, TUMOR, BIOPSY:  ?- Papillary fragments of urothelium with focal inflammation and mild atypia.  ?- No muscularis propria identified.  ? ?COMMENT:  ? ?A. Although there is focal papillary architecture and mild cytologic atypia, evaluation is somewhat limited by specimen size and histologic artifact. A low-grade papillary urothelial neoplasm cannot be entirely  ?excluded. There is no evidence of lamina propria infiltration.  ?  ?04/19/2021 Surgery  ? Preoperative diagnosis: bilateral hydronephrosis ?  ?Postoperative diagnosis: bilateral hydronephrosis, bladder tumor ?  ?Procedure: 1 cystoscopy ?2.  Bladder biopsy with fulgeration ?  ?Attending: Nicolette Bang ?  ?Specimens:  ?Bladder biopsies x 3 ? ?Findings: infiltrative bladder mass involving the trigone. Ureteral orifice unable to be identified.  ?  ?Indications: Patient is a 61 year old female with bilateral hydronephrosis and acute renal failure.  After discussing treatment options, they decided  proceed with bladder biopsy and selective cytologies. ?  ?Procedure in detail: The patient was brought to the operating room and a brief timeout was done to ensure correct patient, correct procedure, correct site.  General anesthesia was administered patient was placed in dorsal lithotomy position.  Their genitalia was then prepped and draped in usual sterile fashion.  A rigid 47 French cystoscope was passed in the urethra and the bladder.  Bladder was inspected and we noted a sessile infiltrative bladder mass involving the entire trigone. The ureteral orifices were unable to be identified. We attempted to probe with a ureteral catheter and zipwire to locate the ureteral orifices which was unsuccessful.  Using the biopsy forceps 3 bladder lesion biopsies were obtained. . Hemostasis was then obtained with a bugbee. the bladder was then drained, a 16 French foley was placed and this concluded the procedure which was well tolerated by patient. ?  ?  ?04/21/2021 Procedure  ? Successful bilateral ultrasound and fluoroscopic 10 French nephrostomies ?  ?05/01/2021 Pathology Results  ? FINAL MICROSCOPIC DIAGNOSIS:  ? ?A. 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  ?No lymphovascular invasion is identified in the current specimen.  ?  ?05/10/2021 Initial Diagnosis  ? Malignant neoplasm of cervix [facility]) ?  ?05/17/2021 Cancer Staging  ? Staging form: Cervix Uteri, AJCC Version 9 ?- Clinical stage from 05/17/2021: FIGO Stage IVA (cT4, cN2a, cM0) - Signed by Heath Lark, MD on 05/17/2021 ?Stage prefix: Initial diagnosis ? ?  ?06/05/2021 PET scan  ? Signs of cervical cancer  with nodal involvement in the chest and abdomen and evidence of diffuse peritoneal disease. ?  ?Question of early bony involvement particularly in the thoracic spine but without substantial increased metabolic activity associated with a subtle sclerotic lesion at T2 and without CT correlate at areas of heterogeneity  elsewhere. Could consider thoracic spine MRI as warranted for further assessment. ?  ?CT findings are largely similar with respect to intra-abdominal findings compared to recent imaging. ?  ?Increased meta

## 2021-07-30 NOTE — Progress Notes (Addendum)
Jordan Caraveo Kawai is here today for follow up post radiation to the pelvic. ? ?They completed their radiation on: 06/27/21 ? ?Does the patient complain of any of the following: ? ?Pain:No ?Abdominal bloating: No ?Diarrhea/Constipation: No ?Nausea/Vomiting: No ?Vaginal Discharge: No ?Blood in Urine or Stool: No ?Urinary Issues (dysuria/incomplete emptying/ incontinence/ increased frequency/urgency): Patient has bilateral nephrostomy tubes. Noted patients left tube to be purple. Right side was clear. Per patient recently had tubes changed, Patient to follow up with urology in 3 months.  UA c&S obtained.  ?Does patient report using vaginal dilator 2-3 times a week and/or sexually active 2-3 weeks: NA ?Post radiation skin changes: Daughter reports patient having skin break down to abdominal folds.   ? ? ?Additional comments if applicable: ? ?Vitals:  ? 07/30/21 1503  ?BP: 96/86  ?Pulse: 98  ?Resp: 20  ?Temp: (!) 97 ?F (36.1 ?C)  ?SpO2: 100%  ?Weight: 273 lb 6.4 oz (124 kg)  ?Height: '5\' 2"'$  (1.575 m)  ?  ?  ?

## 2021-07-30 NOTE — Assessment & Plan Note (Signed)
We discussed prognosis with and without treatment ?She has major serious co-morbidities ?Her disease is incurable and treatment goal is strictly palliative ?

## 2021-08-01 ENCOUNTER — Other Ambulatory Visit: Payer: Self-pay | Admitting: Radiation Oncology

## 2021-08-01 LAB — URINE CULTURE: Culture: >=100000 — AB

## 2021-08-02 ENCOUNTER — Other Ambulatory Visit: Payer: Self-pay | Admitting: Radiation Oncology

## 2021-08-02 ENCOUNTER — Telehealth: Payer: Self-pay | Admitting: Radiology

## 2021-08-02 ENCOUNTER — Telehealth: Payer: Self-pay

## 2021-08-02 MED ORDER — SULFAMETHOXAZOLE-TRIMETHOPRIM 800-160 MG PO TABS
1.0000 | ORAL_TABLET | Freq: Two times a day (BID) | ORAL | 0 refills | Status: DC
Start: 2021-08-02 — End: 2021-08-10

## 2021-08-02 NOTE — Telephone Encounter (Signed)
Notified Debbie [name] at [facility] in [facility] that patient is starting Bactrim DS 1 tablet twice daily for 10 days beginning today. Lattie Haw states she will review and notify patient if there will be a change in warfarin dose. ?

## 2021-08-02 NOTE — Telephone Encounter (Signed)
Patient called with questions about her nephrostomy tubes.  She states one is purple and the other is clear, both are draining fine, denies any other symptoms.  She was told by Zacarias Pontes that it is normal she just wanted to be sure.  Per Dr. Felipa Eth as long as they are both draining it should be ok, to reach out to [facility] where she had it placed.  She was also told to flush the tubes by a nurse in [place] but had questions about supplies and instructions.  I informed her to reach out to the facility to clarify and gave the patient the contact number.  Patient voiced understanding.  ?

## 2021-08-02 NOTE — Telephone Encounter (Signed)
Notified patient of script sent to pharmacy for Bactrim DS for UTI. Patient verbalized understanding. ?

## 2021-08-07 ENCOUNTER — Telehealth: Payer: Self-pay | Admitting: Cardiology

## 2021-08-07 NOTE — Telephone Encounter (Signed)
Called pt.  Confirmed appt for tomorrow and verified warfarin dose for tonight.  She verbalized understanding. ?

## 2021-08-07 NOTE — Telephone Encounter (Signed)
Pt rescheduled appt to tomorrow 4/26. Please advise ?

## 2021-08-08 ENCOUNTER — Ambulatory Visit (INDEPENDENT_AMBULATORY_CARE_PROVIDER_SITE_OTHER): Payer: Medicaid Other | Admitting: *Deleted

## 2021-08-08 DIAGNOSIS — I2699 Other pulmonary embolism without acute cor pulmonale: Secondary | ICD-10-CM

## 2021-08-08 DIAGNOSIS — Z86718 Personal history of other venous thrombosis and embolism: Secondary | ICD-10-CM

## 2021-08-08 DIAGNOSIS — I4891 Unspecified atrial fibrillation: Secondary | ICD-10-CM | POA: Diagnosis not present

## 2021-08-08 DIAGNOSIS — Z5181 Encounter for therapeutic drug level monitoring: Secondary | ICD-10-CM

## 2021-08-08 LAB — POCT INR: INR: 1.7 — AB (ref 2.0–3.0)

## 2021-08-08 NOTE — Patient Instructions (Signed)
Starting Bactrim DS twice daily x 10 days for UTI.  Finish 08/17/21 ?Increase warfarin to 2 tablets daily except 1 tablet on Tuesdays ?Recheck in 10 days ?

## 2021-08-09 ENCOUNTER — Telehealth: Payer: Self-pay

## 2021-08-09 ENCOUNTER — Other Ambulatory Visit: Payer: Self-pay | Admitting: Hematology and Oncology

## 2021-08-09 DIAGNOSIS — C539 Malignant neoplasm of cervix uteri, unspecified: Secondary | ICD-10-CM

## 2021-08-09 NOTE — Telephone Encounter (Signed)
-----   Message from Heath Lark, MD sent at 08/09/2021  9:25 AM EDT ----- ?Pls call her about her final decision what to do ? ?

## 2021-08-09 NOTE — Telephone Encounter (Signed)
Called and given below message. She verbalized understanding and has decided to get Cisplatin only. ?

## 2021-08-09 NOTE — Progress Notes (Signed)
START OFF PATHWAY REGIMEN - Other ? ? ?[id number]:Cisplatin 35 mg/m2 q7 Days + RT: ?  A cycle is every 7 days: ?    Cisplatin  ? ?**Always confirm dose/schedule in your pharmacy ordering system** ? ?Patient Characteristics: ?Intent of Therapy: ?Non-Curative / Palliative Intent, Discussed with Patient ?

## 2021-08-09 NOTE — Telephone Encounter (Signed)
Called and given below message. She verbalized understanding and is expecting calls with appts. ?

## 2021-08-09 NOTE — Telephone Encounter (Signed)
I sent orders for port and LOS for labs, see me and chemo class and chemo starting 5/8 ?

## 2021-08-10 ENCOUNTER — Other Ambulatory Visit: Payer: Self-pay | Admitting: Radiation Oncology

## 2021-08-10 MED ORDER — SULFAMETHOXAZOLE-TRIMETHOPRIM 800-160 MG PO TABS: 1.0000 | ORAL_TABLET | Freq: Two times a day (BID) | ORAL | 0 refills | Status: DC

## 2021-08-14 NOTE — Progress Notes (Signed)
Pharmacist Chemotherapy Monitoring - Initial Assessment   ? ?Anticipated start date: 08/21/21  ? ?The following has been reviewed per standard work regarding the patient's treatment regimen: ?The patient's diagnosis, treatment plan and drug doses, and organ/hematologic function ?Lab orders and baseline tests specific to treatment regimen  ?The treatment plan start date, drug sequencing, and pre-medications ?Prior authorization status  ?Patient's documented medication list, including drug-drug interaction screen and prescriptions for anti-emetics and supportive care specific to the treatment regimen ?The drug concentrations, fluid compatibility, administration routes, and timing of the medications to be used ?The patient's access for treatment and lifetime cumulative dose history, if applicable  ?The patient's medication allergies and previous infusion related reactions, if applicable  ? ?Changes made to treatment plan:  ?N/A ? ?Follow up needed:  ?Pending authorization for treatment  ? ? ?Larene Beach, [place], ?08/14/2021  11:02 AM ? ?

## 2021-08-15 ENCOUNTER — Other Ambulatory Visit: Payer: Self-pay | Admitting: Radiology

## 2021-08-16 ENCOUNTER — Inpatient Hospital Stay: Payer: Medicaid Other | Attending: Gynecologic Oncology

## 2021-08-16 ENCOUNTER — Inpatient Hospital Stay: Payer: Medicaid Other | Admitting: Hematology and Oncology

## 2021-08-16 ENCOUNTER — Encounter: Payer: Self-pay | Admitting: *Deleted

## 2021-08-16 ENCOUNTER — Telehealth: Payer: Self-pay

## 2021-08-16 ENCOUNTER — Telehealth: Payer: Self-pay | Admitting: Cardiology

## 2021-08-16 ENCOUNTER — Inpatient Hospital Stay: Payer: Medicaid Other

## 2021-08-16 ENCOUNTER — Other Ambulatory Visit: Payer: Self-pay

## 2021-08-16 ENCOUNTER — Encounter: Payer: Self-pay | Admitting: Hematology and Oncology

## 2021-08-16 DIAGNOSIS — Z79899 Other long term (current) drug therapy: Secondary | ICD-10-CM | POA: Insufficient documentation

## 2021-08-16 DIAGNOSIS — E8770 Fluid overload, unspecified: Secondary | ICD-10-CM | POA: Insufficient documentation

## 2021-08-16 DIAGNOSIS — C539 Malignant neoplasm of cervix uteri, unspecified: Secondary | ICD-10-CM | POA: Diagnosis present

## 2021-08-16 DIAGNOSIS — Z7901 Long term (current) use of anticoagulants: Secondary | ICD-10-CM | POA: Diagnosis not present

## 2021-08-16 DIAGNOSIS — N184 Chronic kidney disease, stage 4 (severe): Secondary | ICD-10-CM

## 2021-08-16 DIAGNOSIS — R6520 Severe sepsis without septic shock: Secondary | ICD-10-CM

## 2021-08-16 DIAGNOSIS — Z923 Personal history of irradiation: Secondary | ICD-10-CM | POA: Diagnosis not present

## 2021-08-16 DIAGNOSIS — A419 Sepsis, unspecified organism: Secondary | ICD-10-CM

## 2021-08-16 DIAGNOSIS — C786 Secondary malignant neoplasm of retroperitoneum and peritoneum: Secondary | ICD-10-CM | POA: Diagnosis present

## 2021-08-16 LAB — CBC WITH DIFFERENTIAL (CANCER CENTER ONLY)
Abs Immature Granulocytes: 0.03 10*3/uL (ref 0.00–0.07)
Basophils Absolute: 0 10*3/uL (ref 0.0–0.1)
Basophils Relative: 0 %
Eosinophils Absolute: 0.1 10*3/uL (ref 0.0–0.5)
Eosinophils Relative: 1 %
HCT: 30.2 % — ABNORMAL LOW (ref 36.0–46.0)
Hemoglobin: 10 g/dL — ABNORMAL LOW (ref 12.0–15.0)
Immature Granulocytes: 1 %
Lymphocytes Relative: 23 %
Lymphs Abs: 1.3 10*3/uL (ref 0.7–4.0)
MCH: 29.6 pg (ref 26.0–34.0)
MCHC: 33.1 g/dL (ref 30.0–36.0)
MCV: 89.3 fL (ref 80.0–100.0)
Monocytes Absolute: 0.7 10*3/uL (ref 0.1–1.0)
Monocytes Relative: 12 %
Neutro Abs: 3.7 10*3/uL (ref 1.7–7.7)
Neutrophils Relative %: 63 %
Platelet Count: 252 10*3/uL (ref 150–400)
RBC: 3.38 MIL/uL — ABNORMAL LOW (ref 3.87–5.11)
RDW: 17 % — ABNORMAL HIGH (ref 11.5–15.5)
WBC Count: 5.9 10*3/uL (ref 4.0–10.5)
nRBC: 0 % (ref 0.0–0.2)

## 2021-08-16 LAB — BASIC METABOLIC PANEL - CANCER CENTER ONLY
Anion gap: 6 (ref 5–15)
BUN: 8 mg/dL (ref 8–23)
CO2: 26 mmol/L (ref 22–32)
Calcium: 8.4 mg/dL — ABNORMAL LOW (ref 8.9–10.3)
Chloride: 107 mmol/L (ref 98–111)
Creatinine: 1.53 mg/dL — ABNORMAL HIGH (ref 0.44–1.00)
GFR, Estimated: 38 mL/min — ABNORMAL LOW (ref >60)
Glucose, Bld: 80 mg/dL (ref 70–99)
Potassium: 3.5 mmol/L (ref 3.5–5.1)
Sodium: 139 mmol/L (ref 135–145)

## 2021-08-16 LAB — MAGNESIUM: Magnesium: 1.5 mg/dL — ABNORMAL LOW (ref 1.7–2.4)

## 2021-08-16 MED ORDER — FUROSEMIDE 40 MG PO TABS: 40.0000 mg | ORAL_TABLET | Freq: Every day | ORAL | 6 refills | Status: DC

## 2021-08-16 NOTE — Telephone Encounter (Signed)
-----   Message from Heath Lark, MD sent at 08/16/2021  2:22 PM EDT ----- ?I just finished her notes ?Can you help contact her cardiologist office and see if she can be seen on Monday? ?She has lab for INR monitoring only but has significant leg edema from fluid retention ? ?

## 2021-08-16 NOTE — Assessment & Plan Note (Signed)
The patient is prone to have recurrent kidney failure despite bilateral stent placement ?She has signs of acute on chronic renal failure and has significant fluid retention ?She is aware about risk of renal failure with cisplatin chemotherapy ?For now, we are in agreement to hold off chemotherapy until clinical improvement ?She has appointment with urologist and stent exchange at the end of the month ?

## 2021-08-16 NOTE — Telephone Encounter (Signed)
Stop hydrochlorthiazide, start lasix '40mg'$  daily. Can see her Wed at noon in [place] as an add on ? ? ?Zandra Abts MD ?

## 2021-08-16 NOTE — Assessment & Plan Note (Signed)
She did not call me or inform me about her recent antibiotic use ?Due to high risk for infection, I will cancel her port placement tomorrow ?I am also very concerned about 14 pound weight gain in the span of 14 days since last time I saw her ?Her legs are incredibly swollen and I suspect she has signs of fluid overload ?She cannot be sure whether she is on diuretic therapy or not ?Overall, she is deemed unsafe to proceed with treatment that was scheduled for next week ?I reviewed PET CT imaging with her daughter ?Even though she has signs of metastatic disease, the disease burden is low and it is not close to vital organs ?In agreement and understood why chemotherapy has to be canceled for safety reasons ?We will call her in 2 weeks for further follow-up ?If she has improvement in her performance status and good diuresis with medication adjustment, I will bring her back for further review, chemo education class, port placement and chemo scheduling ?She is in agreement ?

## 2021-08-16 NOTE — Progress Notes (Signed)
[place] ?OFFICE PROGRESS NOTE ? ?Patient Care Team: ?Health, [facility] as PCP - General ?Branch, Alphonse Guild, MD as PCP - Cardiology (Cardiology) ? ?ASSESSMENT & PLAN:  ?Malignant neoplasm of cervix ([place]) ?She did not call me or inform me about her recent antibiotic use ?Due to high risk for infection, I will cancel her port placement tomorrow ?I am also very concerned about 14 pound weight gain in the span of 14 days since last time I saw her ?Her legs are incredibly swollen and I suspect she has signs of fluid overload ?She cannot be sure whether she is on diuretic therapy or not ?Overall, she is deemed unsafe to proceed with treatment that was scheduled for next week ?I reviewed PET CT imaging with her daughter ?Even though she has signs of metastatic disease, the disease burden is low and it is not close to vital organs ?In agreement and understood why chemotherapy has to be canceled for safety reasons ?We will call her in 2 weeks for further follow-up ?If she has improvement in her performance status and good diuresis with medication adjustment, I will bring her back for further review, chemo education class, port placement and chemo scheduling ?She is in agreement ? ?Chronic kidney disease (CKD), stage IV (severe) (HCC) ?The patient is prone to have recurrent kidney failure despite bilateral stent placement ?She has signs of acute on chronic renal failure and has significant fluid retention ?She is aware about risk of renal failure with cisplatin chemotherapy ?For now, we are in agreement to hold off chemotherapy until clinical improvement ?She has appointment with urologist and stent exchange at the end of the month ? ?Severe sepsis due to complicated UTI in patient with bilateral nephrostomy ?She has history of UTI sepsis ?She is placed on another course of antibiotics recently ?I explained to her the danger of sepsis with uncontrolled infection and risk of port infection with  recent UTI ?She is in agreement to cancel port placement and chemotherapy and delay her treatment by a few weeks ? ?Fluid overload ?She has gained 14 pounds of fluid weight and her legs are swollen bilaterally ?She is not certain whether she is on hydrochlorothiazide or not ?I recommend the patient to go home and check on medications ?If she is already taking hydrochlorothiazide, I recommend the patient to increase the dose until further evaluation by cardiologist ? ?No orders of the defined types were placed in this encounter. ? ? ?All questions were answered. The patient knows to call the clinic with any problems, questions or concerns. ?The total time spent in the appointment was 40 minutes encounter with patients including review of chart and various tests results, discussions about plan of care and coordination of care plan ?  ?Heath Lark, MD ?08/16/2021 2:20 PM ? ?INTERVAL HISTORY: ?Please see below for problem oriented charting. ?she returns for treatment follow-up with her daughter ?Her daughter asked several questions related to recent imaging study ?She has noted severe bilateral lower extremity edema since the last time she was here ?She also disclosed to me she was started on antibiotics for recurrent UTI ?Denies recent bleeding ? ?REVIEW OF SYSTEMS:   ?Constitutional: Denies fevers, chills or abnormal weight loss ?Eyes: Denies blurriness of vision ?Ears, nose, mouth, throat, and face: Denies mucositis or sore throat ?Respiratory: Denies cough, dyspnea or wheezes ?Cardiovascular: Denies palpitation, chest discomfort  ?Gastrointestinal:  Denies nausea, heartburn or change in bowel habits ?Skin: Denies abnormal skin rashes ?Lymphatics: Denies new lymphadenopathy  or easy bruising ?Neurological:Denies numbness, tingling or new weaknesses ?Behavioral/Psych: Mood is stable, no new changes  ?All other systems were reviewed with the patient and are negative. ? ?I have reviewed the past medical history, past  surgical history, social history and family history with the patient and they are unchanged from previous note. ? ?ALLERGIES:  has No Known Allergies. ? ?MEDICATIONS:  ?Current Outpatient Medications  ?Medication Sig Dispense Refill  ? acetaminophen (TYLENOL) 325 MG tablet Take 2 tablets (650 mg total) by mouth every 6 (six) hours as needed for mild pain or moderate pain.    ? atenolol (TENORMIN) 50 MG tablet Take 50 mg by mouth daily.    ? cholecalciferol (VITAMIN D3) 25 MCG (1000 UT) tablet Take 1,000 Units by mouth daily.    ? folic acid (FOLVITE) 1 MG tablet Take 1 tablet (1 mg total) by mouth daily. 30 tablet 0  ? hydrochlorothiazide (HYDRODIURIL) 12.5 MG tablet Take 12.5 mg by mouth daily. (Patient not taking: Reported on 07/30/2021)    ? hydrocortisone 2.5 % cream Apply 1 application topically daily as needed (itch/rash).    ? oxybutynin (DITROPAN-XL) 10 MG 24 hr tablet Take 10 mg by mouth daily.    ? PHENobarbital (LUMINAL) 97.2 MG tablet Take 97.2 mg by mouth 2 (two) times daily.    ? warfarin (COUMADIN) 5 MG tablet TAKE 1-2 TABLETS BY MOUTH ONCE DAILY AS DIRECTED BY COUMADIN CLINIC 60 tablet 6  ? ?No current facility-administered medications for this visit.  ? ? ?SUMMARY OF ONCOLOGIC HISTORY: ?Oncology History Overview Note  ?PD-L1 CPS: 0% ?  ?Malignant neoplasm of cervix ([place])  ?04/18/2021 Imaging  ? CT abdomen and pelvis WO contrast ? ?1. Bilateral hydronephrosis and hydroureter, without clear cause for obstruction. No urinary tract calculi. ?2. Enlarged uterus, with minimal high attenuation within the endometrial cavity, which could reflect blood products. Given clinical history of vaginal bleeding, follow-up pelvic ultrasound may be useful. ?3. Retroperitoneal lymphadenopathy. ?4. High attenuation material dependently within the gallbladder, consistent with noncalcified gallstones or sludge. No evidence of acute cholecystitis. ?5.  Aortic Atherosclerosis (ICD10-I70.0). ?  ?  ?04/19/2021 Imaging  ? US  pelvis ?Heterogeneous uterus without appreciable mass. Prominent endometrium in this post menopausal woman. Further evaluation with MRI examination or direct visualization is recommended. ?  ?Bilateral ovaries were not seen. ?  ?Exam is limited due to patient's inability to tolerate transvaginal examination. ?  ?  ?04/19/2021 Pathology Results  ? A. BLADDER, TUMOR, BIOPSY:  ?- Papillary fragments of urothelium with focal inflammation and mild atypia.  ?- No muscularis propria identified.  ? ?COMMENT:  ? ?A. Although there is focal papillary architecture and mild cytologic atypia, evaluation is somewhat limited by specimen size and histologic artifact. A low-grade papillary urothelial neoplasm cannot be entirely  ?excluded. There is no evidence of lamina propria infiltration.  ?  ?04/19/2021 Surgery  ? Preoperative diagnosis: bilateral hydronephrosis ?  ?Postoperative diagnosis: bilateral hydronephrosis, bladder tumor ?  ?Procedure: 1 cystoscopy ?2.  Bladder biopsy with fulgeration ?  ?Attending: Nicolette Bang ?  ?Specimens:  ?Bladder biopsies x 3 ? ?Findings: infiltrative bladder mass involving the trigone. Ureteral orifice unable to be identified.  ?  ?Indications: Patient is a 61 year old female with bilateral hydronephrosis and acute renal failure.  After discussing treatment options, they decided proceed with bladder biopsy and selective cytologies. ?  ?Procedure in detail: The patient was brought to the operating room and a brief timeout was done to ensure correct patient, correct  procedure, correct site.  General anesthesia was administered patient was placed in dorsal lithotomy position.  Their genitalia was then prepped and draped in usual sterile fashion.  A rigid 85 French cystoscope was passed in the urethra and the bladder.  Bladder was inspected and we noted a sessile infiltrative bladder mass involving the entire trigone. The ureteral orifices were unable to be identified. We attempted to probe with a  ureteral catheter and zipwire to locate the ureteral orifices which was unsuccessful.  Using the biopsy forceps 3 bladder lesion biopsies were obtained. . Hemostasis was then obtained with a bugbee. the bladder w

## 2021-08-16 NOTE — Assessment & Plan Note (Signed)
She has history of UTI sepsis ?She is placed on another course of antibiotics recently ?I explained to her the danger of sepsis with uncontrolled infection and risk of port infection with recent UTI ?She is in agreement to cancel port placement and chemotherapy and delay her treatment by a few weeks ?

## 2021-08-16 NOTE — Assessment & Plan Note (Signed)
She has gained 14 pounds of fluid weight and her legs are swollen bilaterally ?She is not certain whether she is on hydrochlorothiazide or not ?I recommend the patient to go home and check on medications ?If she is already taking hydrochlorothiazide, I recommend the patient to increase the dose until further evaluation by cardiologist ?

## 2021-08-16 NOTE — Telephone Encounter (Signed)
Called and spoke with office staff at Dr. Percell Locus office with cardiology. Given below message and told Dr. Alvy Bimler note is dictated. Asking for appt Monday when Debbie [name] comes in for lab work with cardiology. Office staff will send message to nurse of MD regarding adding appt. ?

## 2021-08-16 NOTE — Telephone Encounter (Signed)
Notified via mychart.

## 2021-08-16 NOTE — Telephone Encounter (Signed)
Pt c/o swelling: STAT is pt has developed SOB within 24 hours ? ?If swelling, where is the swelling located? legs ? ?How much weight have you gained and in what time span? 14lbs in 14 lbs ? ?Have you gained 3 pounds in a day or 5 pounds in a week? Yes to more than 5lbs in a week.  ? ?Do you have a log of your daily weights (if so, list)?  ? ?Are you currently taking a fluid pill? Unsure  ? ?Are you currently SOB? unsure ? ?Have you traveled recently? No ? ?[facility] called stating Dr. Alvy Bimler would like for patient to be seen ASAP.  She states her legs are incredibly swollen.  She does have a coumadin appt on Monday.  Patient was also on antibiotics for a UTI.  ?

## 2021-08-17 ENCOUNTER — Inpatient Hospital Stay (HOSPITAL_COMMUNITY): Admission: RE | Admit: 2021-08-17 | Payer: Medicaid Other | Source: Ambulatory Visit

## 2021-08-17 ENCOUNTER — Ambulatory Visit (HOSPITAL_COMMUNITY): Payer: Medicaid Other

## 2021-08-17 ENCOUNTER — Encounter: Payer: Self-pay | Admitting: Hematology and Oncology

## 2021-08-20 ENCOUNTER — Ambulatory Visit (INDEPENDENT_AMBULATORY_CARE_PROVIDER_SITE_OTHER): Payer: Medicaid Other | Admitting: *Deleted

## 2021-08-20 DIAGNOSIS — I2699 Other pulmonary embolism without acute cor pulmonale: Secondary | ICD-10-CM | POA: Diagnosis not present

## 2021-08-20 DIAGNOSIS — I4891 Unspecified atrial fibrillation: Secondary | ICD-10-CM | POA: Diagnosis not present

## 2021-08-20 DIAGNOSIS — Z7901 Long term (current) use of anticoagulants: Secondary | ICD-10-CM | POA: Diagnosis not present

## 2021-08-20 DIAGNOSIS — Z5181 Encounter for therapeutic drug level monitoring: Secondary | ICD-10-CM

## 2021-08-20 LAB — POCT INR: INR: 2.3 (ref 2.0–3.0)

## 2021-08-20 NOTE — Patient Instructions (Signed)
Starting Bactrim DS twice daily x 10 days for UTI.  Finish in 1 wk ?Continue warfarin 2 tablets daily except 1 tablet on Tuesdays ?Recheck in 2 wks ?

## 2021-08-21 ENCOUNTER — Ambulatory Visit: Payer: Medicaid Other

## 2021-08-22 ENCOUNTER — Other Ambulatory Visit (HOSPITAL_COMMUNITY)
Admission: RE | Admit: 2021-08-22 | Discharge: 2021-08-22 | Disposition: A | Discharge status: Home / Self Care | Payer: Medicaid Other | Source: Ambulatory Visit | Attending: Cardiology | Admitting: Cardiology

## 2021-08-22 ENCOUNTER — Encounter: Payer: Self-pay | Admitting: Cardiology

## 2021-08-22 ENCOUNTER — Ambulatory Visit (INDEPENDENT_AMBULATORY_CARE_PROVIDER_SITE_OTHER): Payer: Medicaid Other | Admitting: Cardiology

## 2021-08-22 VITALS — BP 136/88 | HR 78 | Ht 62.0 in | Wt 275.6 lb

## 2021-08-22 DIAGNOSIS — R600 Localized edema: Secondary | ICD-10-CM

## 2021-08-22 DIAGNOSIS — I4891 Unspecified atrial fibrillation: Secondary | ICD-10-CM | POA: Insufficient documentation

## 2021-08-22 DIAGNOSIS — I2699 Other pulmonary embolism without acute cor pulmonale: Secondary | ICD-10-CM

## 2021-08-22 LAB — BASIC METABOLIC PANEL
Anion gap: 8 (ref 5–15)
BUN: 8 mg/dL (ref 8–23)
CO2: 24 mmol/L (ref 22–32)
Calcium: 8.4 mg/dL — ABNORMAL LOW (ref 8.9–10.3)
Chloride: 104 mmol/L (ref 98–111)
Creatinine, Ser: 1.64 mg/dL — ABNORMAL HIGH (ref 0.44–1.00)
GFR, Estimated: 35 mL/min — ABNORMAL LOW (ref >60)
Glucose, Bld: 90 mg/dL (ref 70–99)
Potassium: 3.5 mmol/L (ref 3.5–5.1)
Sodium: 136 mmol/L (ref 135–145)

## 2021-08-22 LAB — MAGNESIUM: Magnesium: 1.7 mg/dL (ref 1.7–2.4)
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
## 2021-08-22 NOTE — Patient Instructions (Signed)
Medication Instructions:  ?Your physician recommends that you continue on your current medications as directed. Please refer to the Current Medication list given to you today. ? ? ?Labwork: ?BMET/MAG ? ?Testing/Procedures: ?None ? ?Follow-Up: ?Follow up with Dr. Harl Bowie in August.  ? ?Any Other Special Instructions Will Be Listed Below (If Applicable). ? ? ? ? ?If you need a refill on your cardiac medications before your next appointment, please call your pharmacy. ? ?

## 2021-08-27 ENCOUNTER — Other Ambulatory Visit: Payer: Medicaid Other

## 2021-08-27 ENCOUNTER — Ambulatory Visit: Payer: Medicaid Other

## 2021-08-27 ENCOUNTER — Ambulatory Visit: Payer: Medicaid Other | Admitting: Hematology and Oncology

## 2021-08-27 ENCOUNTER — Telehealth: Payer: Self-pay | Admitting: *Deleted

## 2021-08-27 NOTE — Telephone Encounter (Signed)
-----   Message from Arnoldo Lenis, MD sent at 08/27/2021 12:28 PM EDT ----- ?Normal labs. Some kidney dyfunction simialr to prior labs ? ? ?Zandra Abts MD ?

## 2021-08-27 NOTE — Telephone Encounter (Signed)
Laurine Blazer, LPN  ?1/84/0375  4:36 PM EDT Back to Top  ?  ?Larene Beach (daughter) notified, copy to pcp.  ? ?

## 2021-08-30 ENCOUNTER — Telehealth: Payer: Self-pay | Admitting: Oncology

## 2021-08-30 NOTE — Telephone Encounter (Signed)
Debbie [name] and scheduled labs and follow up with Dr. Alvy Bimler on 09/14/21.  She verbalized understanding and agreement of appointment dates and times.

## 2021-09-03 ENCOUNTER — Ambulatory Visit (INDEPENDENT_AMBULATORY_CARE_PROVIDER_SITE_OTHER): Payer: Medicaid Other | Admitting: *Deleted

## 2021-09-03 DIAGNOSIS — Z5181 Encounter for therapeutic drug level monitoring: Secondary | ICD-10-CM

## 2021-09-03 DIAGNOSIS — I2699 Other pulmonary embolism without acute cor pulmonale: Secondary | ICD-10-CM | POA: Diagnosis not present

## 2021-09-03 DIAGNOSIS — I4891 Unspecified atrial fibrillation: Secondary | ICD-10-CM

## 2021-09-03 LAB — POCT INR: INR: 1.7 — AB (ref 2.0–3.0)

## 2021-09-03 NOTE — Patient Instructions (Signed)
Take warfarin 3 tablets tonight then increase dose to 2 tablets daily  Recheck in 3 wks

## 2021-09-11 ENCOUNTER — Inpatient Hospital Stay (HOSPITAL_COMMUNITY): Admission: RE | Admit: 2021-09-11 | Payer: Medicaid Other | Source: Ambulatory Visit

## 2021-09-12 ENCOUNTER — Ambulatory Visit (HOSPITAL_COMMUNITY)
Admission: RE | Admit: 2021-09-12 | Discharge: 2021-09-12 | Disposition: A | Discharge status: Home / Self Care | Payer: Medicaid Other | Source: Ambulatory Visit | Attending: Interventional Radiology | Admitting: Interventional Radiology

## 2021-09-12 ENCOUNTER — Other Ambulatory Visit (HOSPITAL_COMMUNITY): Payer: Self-pay | Admitting: Interventional Radiology

## 2021-09-12 DIAGNOSIS — Z436 Encounter for attention to other artificial openings of urinary tract: Secondary | ICD-10-CM | POA: Insufficient documentation

## 2021-09-12 DIAGNOSIS — N1330 Unspecified hydronephrosis: Secondary | ICD-10-CM

## 2021-09-12 DIAGNOSIS — N1339 Other hydronephrosis: Secondary | ICD-10-CM

## 2021-09-12 HISTORY — PX: IR NEPHROSTOMY EXCHANGE RIGHT: IMG6070

## 2021-09-12 HISTORY — PX: IR NEPHROSTOMY EXCHANGE LEFT: IMG6069

## 2021-09-12 MED ORDER — LIDOCAINE-EPINEPHRINE 1 %-1:100000 IJ SOLN
INTRAMUSCULAR | Status: AC
  Filled 2021-09-12: qty 1

## 2021-09-12 MED ORDER — IOHEXOL 300 MG/ML  SOLN
100.0000 mL | Freq: Once | INTRAMUSCULAR | Status: AC | PRN
  Administered 2021-09-12: 15 mL

## 2021-09-14 ENCOUNTER — Inpatient Hospital Stay: Payer: Medicaid Other | Admitting: Hematology and Oncology

## 2021-09-14 ENCOUNTER — Telehealth: Payer: Self-pay

## 2021-09-14 ENCOUNTER — Inpatient Hospital Stay: Payer: Medicaid Other | Attending: Gynecologic Oncology

## 2021-09-14 ENCOUNTER — Other Ambulatory Visit: Payer: Self-pay

## 2021-09-14 ENCOUNTER — Encounter: Payer: Self-pay | Admitting: Hematology and Oncology

## 2021-09-14 VITALS — BP 127/53 | HR 70 | Temp 97.9°F | Resp 18 | Ht 62.0 in | Wt 277.9 lb

## 2021-09-14 DIAGNOSIS — E8770 Fluid overload, unspecified: Secondary | ICD-10-CM

## 2021-09-14 DIAGNOSIS — N184 Chronic kidney disease, stage 4 (severe): Secondary | ICD-10-CM | POA: Insufficient documentation

## 2021-09-14 DIAGNOSIS — Z7901 Long term (current) use of anticoagulants: Secondary | ICD-10-CM | POA: Diagnosis not present

## 2021-09-14 DIAGNOSIS — C539 Malignant neoplasm of cervix uteri, unspecified: Secondary | ICD-10-CM

## 2021-09-14 DIAGNOSIS — C786 Secondary malignant neoplasm of retroperitoneum and peritoneum: Secondary | ICD-10-CM | POA: Diagnosis present

## 2021-09-14 DIAGNOSIS — Z79899 Other long term (current) drug therapy: Secondary | ICD-10-CM | POA: Insufficient documentation

## 2021-09-14 LAB — CBC WITH DIFFERENTIAL (CANCER CENTER ONLY)
Abs Immature Granulocytes: 0.03 10*3/uL (ref 0.00–0.07)
Basophils Absolute: 0 10*3/uL (ref 0.0–0.1)
Basophils Relative: 0 %
Eosinophils Absolute: 0 10*3/uL (ref 0.0–0.5)
Eosinophils Relative: 0 %
HCT: 28.9 % — ABNORMAL LOW (ref 36.0–46.0)
Hemoglobin: 9.3 g/dL — ABNORMAL LOW (ref 12.0–15.0)
Immature Granulocytes: 1 %
Lymphocytes Relative: 19 %
Lymphs Abs: 1.2 10*3/uL (ref 0.7–4.0)
MCH: 29.9 pg (ref 26.0–34.0)
MCHC: 32.2 g/dL (ref 30.0–36.0)
MCV: 92.9 fL (ref 80.0–100.0)
Monocytes Absolute: 0.6 10*3/uL (ref 0.1–1.0)
Monocytes Relative: 10 %
Neutro Abs: 4.3 10*3/uL (ref 1.7–7.7)
Neutrophils Relative %: 70 %
Platelet Count: 246 10*3/uL (ref 150–400)
RBC: 3.11 MIL/uL — ABNORMAL LOW (ref 3.87–5.11)
RDW: 15.7 % — ABNORMAL HIGH (ref 11.5–15.5)
WBC Count: 6.1 10*3/uL (ref 4.0–10.5)
nRBC: 0 % (ref 0.0–0.2)

## 2021-09-14 LAB — BASIC METABOLIC PANEL - CANCER CENTER ONLY
Anion gap: 5 (ref 5–15)
BUN: 15 mg/dL (ref 8–23)
CO2: 25 mmol/L (ref 22–32)
Calcium: 9.2 mg/dL (ref 8.9–10.3)
Chloride: 107 mmol/L (ref 98–111)
Creatinine: 1.63 mg/dL — ABNORMAL HIGH (ref 0.44–1.00)
GFR, Estimated: 35 mL/min — ABNORMAL LOW (ref >60)
Glucose, Bld: 97 mg/dL (ref 70–99)
Potassium: 4.1 mmol/L (ref 3.5–5.1)
Sodium: 137 mmol/L (ref 135–145)

## 2021-09-14 LAB — MAGNESIUM: Magnesium: 1.7 mg/dL (ref 1.7–2.4)

## 2021-09-14 NOTE — Progress Notes (Signed)
[place] OFFICE PROGRESS NOTE  Patient Care Team: Health, [facility] as PCP - General Branch, Alphonse Guild, MD as PCP - Cardiology (Cardiology)  ASSESSMENT & PLAN:  Malignant neoplasm of cervix [facility]) We did not start her treatment several weeks ago due to severe weight gain in the setting of chronic kidney failure and possibly congestive heart failure Since then, she has not been able to lose the extra weight She is not symptomatic from her cervical cancer We have a long discussion today We discussed risk of complete renal failure with cisplatin The patient has not made any lifestyle changes After long discussion, she is willing to make lifestyle changes I plan to order port placement with plan to potentially start chemotherapy in 2 weeks Due to transportation issue, I have not scheduled her treatment yet pending additional information whether we can arrange for her to get treatment here in [facility]  Chronic kidney disease (CKD), stage IV (severe) ([place]) The patient is prone to have recurrent kidney failure despite bilateral stent placement She is aware about risk of renal failure with cisplatin chemotherapy For now, we are in agreement to hold off chemotherapy until clinical improvement She has appointment with urologist and stent exchange at the end of the month  Fluid overload She was prescribed furosemide under the guidance of cardiologist We discussed importance of dietary modification and lifestyle changes We will monitor for signs and symptoms of congestive heart failure closely  Obesity, Class III, BMI 40-49.9 (morbid obesity) ([place]) She has significant multiorgan failure associated with obesity We had a long discussion about the importance of lifestyle changes  Orders Placed This Encounter  Procedures   IR IMAGING GUIDED PORT INSERTION    Standing Status:   Future    Standing Expiration Date:   09/15/2022    Order Specific Question:   Reason  for Exam (SYMPTOM  OR DIAGNOSIS REQUIRED)    Answer:   need port for chemo to tart on 6/16    Order Specific Question:   Preferred Imaging Location?    Answer:   [facility]    All questions were answered. The patient knows to call the clinic with any problems, questions or concerns. The total time spent in the appointment was 30 minutes encounter with patients including review of chart and various tests results, discussions about plan of care and coordination of care plan   Heath Lark, MD 09/14/2021 1:22 PM  INTERVAL HISTORY: Please see below for problem oriented charting. she returns for treatment follow-up Over the past few weeks, he has close follow-up with cardiologist She has gained weight since last time I saw her She continues to have chronic bilateral lower extremity edema Denies recent vaginal bleeding or hematuria  REVIEW OF SYSTEMS:   Constitutional: Denies fevers, chills or abnormal weight loss Eyes: Denies blurriness of vision Ears, nose, mouth, throat, and face: Denies mucositis or sore throat Respiratory: Denies cough, dyspnea or wheezes Cardiovascular: Denies palpitation, chest discomfort  Gastrointestinal:  Denies nausea, heartburn or change in bowel habits Skin: Denies abnormal skin rashes Lymphatics: Denies new lymphadenopathy or easy bruising Neurological:Denies numbness, tingling or new weaknesses Behavioral/Psych: Mood is stable, no new changes  All other systems were reviewed with the patient and are negative.  I have reviewed the past medical history, past surgical history, social history and family history with the patient and they are unchanged from previous note.  ALLERGIES:  has No Known Allergies.  MEDICATIONS:  Current Outpatient Medications  Medication Sig Dispense Refill   acetaminophen (TYLENOL) 325 MG tablet Take 2 tablets (650 mg total) by mouth every 6 (six) hours as needed for mild pain or moderate pain.     atenolol (TENORMIN) 50  MG tablet Take 50 mg by mouth daily.     cholecalciferol (VITAMIN D3) 25 MCG (1000 UT) tablet Take 1,000 Units by mouth daily.     folic acid (FOLVITE) 1 MG tablet Take 1 tablet (1 mg total) by mouth daily. 30 tablet 0   furosemide (LASIX) 40 MG tablet Take 1 tablet (40 mg total) by mouth daily. 30 tablet 6   hydrocortisone 2.5 % cream Apply 1 application topically daily as needed (itch/rash).     oxybutynin (DITROPAN-XL) 10 MG 24 hr tablet Take 10 mg by mouth daily.     PHENobarbital (LUMINAL) 97.2 MG tablet Take 97.2 mg by mouth 2 (two) times daily.     warfarin (COUMADIN) 5 MG tablet TAKE 1-2 TABLETS BY MOUTH ONCE DAILY AS DIRECTED BY COUMADIN CLINIC 60 tablet 6   No current facility-administered medications for this visit.    SUMMARY OF ONCOLOGIC HISTORY: Oncology History Overview Note  PD-L1 CPS: 0%   Malignant neoplasm of cervix ([place])  04/18/2021 Imaging   CT abdomen and pelvis WO contrast  1. Bilateral hydronephrosis and hydroureter, without clear cause for obstruction. No urinary tract calculi. 2. Enlarged uterus, with minimal high attenuation within the endometrial cavity, which could reflect blood products. Given clinical history of vaginal bleeding, follow-up pelvic ultrasound may be useful. 3. Retroperitoneal lymphadenopathy. 4. High attenuation material dependently within the gallbladder, consistent with noncalcified gallstones or sludge. No evidence of acute cholecystitis. 5.  Aortic Atherosclerosis (ICD10-I70.0).     04/19/2021 Imaging   US pelvis Heterogeneous uterus without appreciable mass. Prominent endometrium in this post menopausal woman. Further evaluation with MRI examination or direct visualization is recommended.   Bilateral ovaries were not seen.   Exam is limited due to patient's inability to tolerate transvaginal examination.     04/19/2021 Pathology Results   A. BLADDER, TUMOR, BIOPSY:  - Papillary fragments of urothelium with focal inflammation and mild  atypia.  - No muscularis propria identified.   COMMENT:   A. Although there is focal papillary architecture and mild cytologic atypia, evaluation is somewhat limited by specimen size and histologic artifact. A low-grade papillary urothelial neoplasm cannot be entirely  excluded. There is no evidence of lamina propria infiltration.    04/19/2021 Surgery   Preoperative diagnosis: bilateral hydronephrosis   Postoperative diagnosis: bilateral hydronephrosis, bladder tumor   Procedure: 1 cystoscopy 2.  Bladder biopsy with fulgeration   Attending: Nicolette Bang   Specimens:  Bladder biopsies x 3  Findings: infiltrative bladder mass involving the trigone. Ureteral orifice unable to be identified.    Indications: Patient is a 61 year old female with bilateral hydronephrosis and acute renal failure.  After discussing treatment options, they decided proceed with bladder biopsy and selective cytologies.   Procedure in detail: The patient was brought to the operating room and a brief timeout was done to ensure correct patient, correct procedure, correct site.  General anesthesia was administered patient was placed in dorsal lithotomy position.  Their genitalia was then prepped and draped in usual sterile fashion.  A rigid 12 French cystoscope was passed in the urethra and the bladder.  Bladder was inspected and we noted a sessile infiltrative bladder mass involving the entire trigone. The ureteral orifices were unable to be identified. We attempted to probe with  a ureteral catheter and zipwire to locate the ureteral orifices which was unsuccessful.  Using the biopsy forceps 3 bladder lesion biopsies were obtained. . Hemostasis was then obtained with a bugbee. the bladder was then drained, a 16 French foley was placed and this concluded the procedure which was well tolerated by patient.     04/21/2021 Procedure   Successful bilateral ultrasound and fluoroscopic 10 French nephrostomies   05/01/2021  Pathology Results   FINAL MICROSCOPIC DIAGNOSIS:   A. 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  No lymphovascular invasion is identified in the current specimen.    05/10/2021 Initial Diagnosis   Malignant neoplasm of cervix ([place])   05/17/2021 Cancer Staging   Staging form: Cervix Uteri, AJCC Version 9 - Clinical stage from 05/17/2021: FIGO Stage IVB (rcT4, cN2a, cM1) - Signed by Heath Lark, MD on 08/16/2021 Stage prefix: Recurrence    06/05/2021 PET scan   Signs of cervical cancer with nodal involvement in the chest and abdomen and evidence of diffuse peritoneal disease.   Question of early bony involvement particularly in the thoracic spine but without substantial increased metabolic activity associated with a subtle sclerotic lesion at T2 and without CT correlate at areas of heterogeneity elsewhere. Could consider thoracic spine MRI as warranted for further assessment.   CT findings are largely similar with respect to intra-abdominal findings compared to recent imaging.   Increased metabolic activity about the anal canal favored to be physiologic. Given focal appearance correlate with any symptoms and direct visualization as warranted.   Sclerosis about the iliac and pubic bones favored to be related osteitis. Potentially associated with chronic insufficiency fractures.    Signs of prior LEFT frontal craniotomy and presumed underlying encephalomalacia. No priors are available for comparison. Correlate with any recent changes in neurologic symptoms to determine whether further evaluation may be warranted.   07/30/2021 PET scan   1. Overall response to therapy with significant improvement in previously demonstrated hypermetabolic activity associated with the uterus and cervix as well as multiple retroperitoneal lymph nodes. 2. Persistent hypermetabolic peritoneal metastatic disease without substantial improvement. If anything, this may  be slightly worse. 3. Similar hypermetabolic lymph node at the left thoracic inlet. No other evidence of thoracic metastatic disease. 4. Indeterminate asymmetric activity in the nasopharynx and piriformis sinuses, potentially inflammatory.     08/21/2021 -  Chemotherapy   Patient is on Treatment Plan : Cervical cancer Cisplatin q7d         PHYSICAL EXAMINATION: ECOG PERFORMANCE STATUS: 2 - Symptomatic, <50% confined to bed  Vitals:   09/14/21 1105  BP: (!) 127/53  Pulse: 70  Resp: 18  Temp: 97.9 F (36.6 C)  SpO2: 100%   Filed Weights   09/14/21 1105  Weight: 277 lb 14.4 oz (126.1 kg)    GENERAL:alert, no distress and comfortable NEURO: alert & oriented x 3 with fluent speech, no focal motor/sensory deficits She has moderate bilateral lower extremity edema  LABORATORY DATA:  I have reviewed the data as listed    Component Value Date/Time   NA 137 09/14/2021 1038   K 4.1 09/14/2021 1038   CL 107 09/14/2021 1038   CO2 25 09/14/2021 1038   GLUCOSE 97 09/14/2021 1038   BUN 15 09/14/2021 1038   CREATININE 1.63 (H) 09/14/2021 1038   CALCIUM 9.2 09/14/2021 1038   PROT 6.8 07/27/2021 0915   ALBUMIN 2.8 (L) 07/27/2021 0915   AST 15 07/27/2021 0915   ALT 10 07/27/2021  0915   ALKPHOS 117 07/27/2021 0915   BILITOT 0.3 07/27/2021 0915   GFRNONAA 35 (L) 09/14/2021 1038   GFRAA >60 08/12/2018 0552    No results found for: SPEP, UPEP  Lab Results  Component Value Date   WBC 6.1 09/14/2021   NEUTROABS 4.3 09/14/2021   HGB 9.3 (L) 09/14/2021   HCT 28.9 (L) 09/14/2021   MCV 92.9 09/14/2021   PLT 246 09/14/2021      Chemistry      Component Value Date/Time   NA 137 09/14/2021 1038   K 4.1 09/14/2021 1038   CL 107 09/14/2021 1038   CO2 25 09/14/2021 1038   BUN 15 09/14/2021 1038   CREATININE 1.63 (H) 09/14/2021 1038      Component Value Date/Time   CALCIUM 9.2 09/14/2021 1038   ALKPHOS 117 07/27/2021 0915   AST 15 07/27/2021 0915   ALT 10 07/27/2021 0915    BILITOT 0.3 07/27/2021 0915       RADIOGRAPHIC STUDIES: I have personally reviewed the radiological images as listed and agreed with the findings in the report. IR NEPHROSTOMY EXCHANGE LEFT  Result Date: 09/12/2021 INDICATION: 61 year old female with history of gynecologic malignancy with bilateral ureteral obstructions now with chronic bilateral nephrostomy tubes in place. Presents for routine check and exchange. EXAM: FLUOROSCOPIC GUIDED BILATERAL SIDED NEPHROSTOMY CATHETER EXCHANGE COMPARISON:  None Available. CONTRAST:  A total of 15 mL Isovue-300 administered was administered into both collecting systems FLUOROSCOPY TIME:  One minute 0 seconds, 14 mGy COMPLICATIONS: None immediate. TECHNIQUE: Informed written consent was obtained from the patient after a discussion of the risks, benefits and alternatives to treatment. Questions regarding the procedure were encouraged and answered. A timeout was performed prior to the initiation of the procedure. The bilateral flanks and external portions of existing nephrostomy catheters were prepped and draped in the usual sterile fashion. A sterile drape was applied covering the operative field. Maximum barrier sterile technique with sterile gowns and gloves were used for the procedure. A timeout was performed prior to the initiation of the procedure. A pre procedural spot fluoroscopic image was obtained. Beginning with the left-sided nephrostomy, a small amount of contrast was injected via the existing left-sided nephrostomy catheter demonstrating appropriate positioning within the renal pelvis. The existing nephrostomy catheter was cut and cannulated with a Benson wire which was coiled within the renal pelvis. Under intermittent fluoroscopic guidance, the existing nephrostomy catheter was exchanged for a new 12 [place] all-purpose drainage catheter. Limited contrast injection confirmed appropriate positioning within the left renal pelvis and a post exchange  fluoroscopic image was obtained. The catheter was locked, secured to the skin with an interrupted suture and reconnected to a gravity bag. The identical repeat procedure was repeated for the contralateral right-sided nephrostomy, ultimately allowing successful exchange of a new 12 [place] all-purpose drainage catheter with end coiled and locked within the right renal pelvis. Dressings were placed. The patient tolerated the above procedures well without immediate postprocedural complication. FINDINGS: The existing nephrostomy catheters are appropriately positioned and functioning. After successful fluoroscopic guided exchange, new bilateral 12French nephrostomy catheters are coiled and locked within the respective renal pelvises. IMPRESSION: Successful fluoroscopic guided exchange of bilateral 12 French percutaneous nephrostomy catheters. Ruthann Cancer, MD [facility] Radiology Electronically Signed   By: Ruthann Cancer M.D.   On: 09/12/2021 13:47   IR NEPHROSTOMY EXCHANGE RIGHT  Result Date: 09/12/2021 INDICATION: 62 year old female with history of gynecologic malignancy with bilateral ureteral obstructions now with chronic bilateral  nephrostomy tubes in place. Presents for routine check and exchange. EXAM: FLUOROSCOPIC GUIDED BILATERAL SIDED NEPHROSTOMY CATHETER EXCHANGE COMPARISON:  None Available. CONTRAST:  A total of 15 mL Isovue-300 administered was administered into both collecting systems FLUOROSCOPY TIME:  One minute 0 seconds, 14 mGy COMPLICATIONS: None immediate. TECHNIQUE: Informed written consent was obtained from the patient after a discussion of the risks, benefits and alternatives to treatment. Questions regarding the procedure were encouraged and answered. A timeout was performed prior to the initiation of the procedure. The bilateral flanks and external portions of existing nephrostomy catheters were prepped and draped in the usual sterile fashion.  A sterile drape was applied covering the operative field. Maximum barrier sterile technique with sterile gowns and gloves were used for the procedure. A timeout was performed prior to the initiation of the procedure. A pre procedural spot fluoroscopic image was obtained. Beginning with the left-sided nephrostomy, a small amount of contrast was injected via the existing left-sided nephrostomy catheter demonstrating appropriate positioning within the renal pelvis. The existing nephrostomy catheter was cut and cannulated with a Benson wire which was coiled within the renal pelvis. Under intermittent fluoroscopic guidance, the existing nephrostomy catheter was exchanged for a new 12 [place] all-purpose drainage catheter. Limited contrast injection confirmed appropriate positioning within the left renal pelvis and a post exchange fluoroscopic image was obtained. The catheter was locked, secured to the skin with an interrupted suture and reconnected to a gravity bag. The identical repeat procedure was repeated for the contralateral right-sided nephrostomy, ultimately allowing successful exchange of a new 12 [place] all-purpose drainage catheter with end coiled and locked within the right renal pelvis. Dressings were placed. The patient tolerated the above procedures well without immediate postprocedural complication. FINDINGS: The existing nephrostomy catheters are appropriately positioned and functioning. After successful fluoroscopic guided exchange, new bilateral 12French nephrostomy catheters are coiled and locked within the respective renal pelvises. IMPRESSION: Successful fluoroscopic guided exchange of bilateral 12 French percutaneous nephrostomy catheters. Ruthann Cancer, MD [facility] Radiology Electronically Signed   By: Ruthann Cancer M.D.   On: 09/12/2021 13:47

## 2021-09-14 NOTE — Assessment & Plan Note (Signed)
She was prescribed furosemide under the guidance of cardiologist We discussed importance of dietary modification and lifestyle changes We will monitor for signs and symptoms of congestive heart failure closely

## 2021-09-14 NOTE — Assessment & Plan Note (Signed)
She has significant multiorgan failure associated with obesity We had a long discussion about the importance of lifestyle changes

## 2021-09-14 NOTE — Assessment & Plan Note (Signed)
We did not start her treatment several weeks ago due to severe weight gain in the setting of chronic kidney failure and possibly congestive heart failure Since then, she has not been able to lose the extra weight She is not symptomatic from her cervical cancer We have a long discussion today We discussed risk of complete renal failure with cisplatin The patient has not made any lifestyle changes After long discussion, she is willing to make lifestyle changes I plan to order port placement with plan to potentially start chemotherapy in 2 weeks Due to transportation issue, I have not scheduled her treatment yet pending additional information whether we can arrange for her to get treatment here in [facility]

## 2021-09-14 NOTE — Assessment & Plan Note (Signed)
The patient is prone to have recurrent kidney failure despite bilateral stent placement She is aware about risk of renal failure with cisplatin chemotherapy For now, we are in agreement to hold off chemotherapy until clinical improvement She has appointment with urologist and stent exchange at the end of the month

## 2021-09-14 NOTE — Telephone Encounter (Signed)
Called back and spoke with daughter. Given below message with appt details for port at [facility]. She verbalized understanding.

## 2021-09-14 NOTE — Telephone Encounter (Signed)
Called and left a message asking her to call the office back. Port appt scheduled at [facility] on 6/6, arrive at [facility] main entrance at 1000 am and check in at admissions. NPO, need a driver, may take am meds, needs 24 hr supervision and hold diabetes medications.

## 2021-09-17 ENCOUNTER — Other Ambulatory Visit: Payer: Self-pay | Admitting: Student

## 2021-09-17 ENCOUNTER — Telehealth (HOSPITAL_COMMUNITY): Payer: Self-pay

## 2021-09-17 NOTE — Telephone Encounter (Signed)
Returned pt's call, no answer, left vm. AW  

## 2021-09-18 ENCOUNTER — Other Ambulatory Visit: Payer: Self-pay | Admitting: Student

## 2021-09-18 ENCOUNTER — Ambulatory Visit (HOSPITAL_COMMUNITY)
Admission: RE | Admit: 2021-09-18 | Discharge: 2021-09-18 | Disposition: A | Discharge status: Home / Self Care | Payer: Medicaid Other | Source: Ambulatory Visit | Attending: Hematology and Oncology | Admitting: Hematology and Oncology

## 2021-09-18 ENCOUNTER — Telehealth: Payer: Self-pay | Admitting: Hematology and Oncology

## 2021-09-18 DIAGNOSIS — C539 Malignant neoplasm of cervix uteri, unspecified: Secondary | ICD-10-CM

## 2021-09-18 MED ORDER — HEPARIN SOD (PORK) LOCK FLUSH 100 UNIT/ML IV SOLN
INTRAVENOUS | Status: AC
  Filled 2021-09-18: qty 5

## 2021-09-18 MED ORDER — LIDOCAINE-EPINEPHRINE 1 %-1:100000 IJ SOLN
INTRAMUSCULAR | Status: AC
  Filled 2021-09-18: qty 1

## 2021-09-18 NOTE — Telephone Encounter (Signed)
.  Called pt per 6/6 inbasket , Patient was unavailable, a message with appt time and date was left with number on file.

## 2021-09-21 ENCOUNTER — Ambulatory Visit: Payer: Medicaid Other | Admitting: Urology

## 2021-09-21 ENCOUNTER — Encounter: Payer: Self-pay | Admitting: Urology

## 2021-09-21 VITALS — BP 116/79 | HR 88

## 2021-09-21 DIAGNOSIS — N1339 Other hydronephrosis: Secondary | ICD-10-CM | POA: Diagnosis not present

## 2021-09-21 LAB — URINALYSIS, ROUTINE W REFLEX MICROSCOPIC
Bilirubin, UA: NEGATIVE
Glucose, UA: NEGATIVE
Ketones, UA: NEGATIVE
Nitrite, UA: NEGATIVE
Specific Gravity, UA: 1.015 (ref 1.005–1.030)
Urobilinogen, Ur: 0.2 mg/dL (ref 0.2–1.0)
pH, UA: >9 — ABNORMAL HIGH (ref 5.0–7.5)

## 2021-09-21 LAB — MICROSCOPIC EXAMINATION: Renal Epithel, UA: NONE SEEN /hpf
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
## 2021-09-21 NOTE — Patient Instructions (Signed)
Hydronephrosis  Hydronephrosis is the swelling of one or both kidneys due to a blockage that stops urine from flowing out of the body. Kidneys filter waste from the blood and produce urine. This condition can lead to kidney failure and may become life-threatening if not treated promptly. What are the causes? In infants and children, common causes include problems that occur when a baby is developing in the womb. These can include problems in the kidneys or in the tubes that drain urine into the bladder (ureters). In adults, common causes include: Kidney stones. Pregnancy. A tumor or cyst in the abdomen or pelvis. An enlarged prostate gland. Other causes include: Bladder infection. Scar tissue from a previous surgery or injury. A blood clot. Cancer of the prostate, bladder, uterus, ovary, or colon. What are the signs or symptoms? Symptoms of this condition include: Pain or discomfort in your side (flank) or abdomen. Swelling in your abdomen. Nausea and vomiting. Fever. Pain when passing urine. Feelings of urgency when you need to urinate. Urinating more often than normal. In some cases, you may not have any symptoms. How is this diagnosed? This condition may be diagnosed based on: Your symptoms and medical history. A physical exam. Blood and urine tests. Imaging tests, such as an ultrasound, CT scan, or MRI. A procedure to look at your urinary tract and bladder by inserting a scope into the urethra (cystoscopy). How is this treated? Treatment for this condition depends on where the blockage is, how long it has been there, and what caused it. The goal of treatment is to remove the blockage. Treatment may include: Antibiotic medicines to treat or prevent infection. A procedure to place a small, thin tube (stent) into a blocked ureter. The stent will keep the ureter open so that urine can drain through it. A nonsurgical procedure that crushes kidney stones with shock waves  (extracorporeal shock wave lithotripsy). If kidney failure occurs, treatment may include dialysis or a kidney transplant. Follow these instructions at home:  Take over-the-counter and prescription medicines only as told by your health care provider. If you were prescribed an antibiotic medicine, take it exactly as told by your health care provider. Do not stop taking the antibiotic even if you start to feel better. Rest and return to your normal activities as told by your health care provider. Ask your health care provider what activities are safe for you. Drink enough fluid to keep your urine pale yellow. Keep all follow-up visits. This is important. Contact a health care provider if: You continue to have symptoms after treatment. You develop new symptoms. Your urine becomes cloudy or bloody. You have a fever. Get help right away if: You have severe flank or abdominal pain. You cannot drink fluids without vomiting. Summary Hydronephrosis is the swelling of one or both kidneys due to a blockage that stops urine from flowing out of the body. Hydronephrosis can lead to kidney failure and may become life-threatening if not treated promptly. The goal of treatment is to remove the blockage. It may include a procedure to insert a stent into a blocked ureter, a procedure to break up kidney stones, or taking antibiotic medicines. Follow your health care provider's instructions for taking care of yourself at home, including instructions about drinking fluids, taking medicines, and limiting activities. This information is not intended to replace advice given to you by your health care provider. Make sure you discuss any questions you have with your health care provider. Document Revised: 07/20/2019 Document Reviewed: 07/20/2019 Elsevier   Patient Education  2023 [employer]vier Inc.  

## 2021-09-24 ENCOUNTER — Ambulatory Visit (INDEPENDENT_AMBULATORY_CARE_PROVIDER_SITE_OTHER): Payer: Medicaid Other | Admitting: *Deleted

## 2021-09-24 ENCOUNTER — Ambulatory Visit: Payer: Medicaid Other | Admitting: Cardiology

## 2021-09-24 DIAGNOSIS — Z5181 Encounter for therapeutic drug level monitoring: Secondary | ICD-10-CM | POA: Diagnosis not present

## 2021-09-24 DIAGNOSIS — Z86711 Personal history of pulmonary embolism: Secondary | ICD-10-CM | POA: Diagnosis not present

## 2021-09-24 DIAGNOSIS — I480 Paroxysmal atrial fibrillation: Secondary | ICD-10-CM

## 2021-09-24 DIAGNOSIS — I4891 Unspecified atrial fibrillation: Secondary | ICD-10-CM

## 2021-09-24 DIAGNOSIS — I2699 Other pulmonary embolism without acute cor pulmonale: Secondary | ICD-10-CM | POA: Diagnosis not present

## 2021-09-24 DIAGNOSIS — I82401 Acute embolism and thrombosis of unspecified deep veins of right lower extremity: Secondary | ICD-10-CM | POA: Diagnosis not present

## 2021-09-24 DIAGNOSIS — Z7901 Long term (current) use of anticoagulants: Secondary | ICD-10-CM

## 2021-09-24 LAB — POCT INR: INR: 1.9 — AB (ref 2.0–3.0)

## 2021-09-24 NOTE — Patient Instructions (Signed)
Increase warfarin to 2 tablets daily except 3 tablets on Mondays Recheck in 3 wks

## 2021-09-26 ENCOUNTER — Telehealth: Payer: Self-pay

## 2021-09-26 NOTE — Telephone Encounter (Signed)
If port cannot be placed in time we would have to cancel her treatment end of month

## 2021-09-26 NOTE — Telephone Encounter (Signed)
Called [facility] regarding message from the scheduler that she canceled 6/16 chemo appt. Tried calling daughter, no answer. She said that she does not have transportation for chemo appt.  Abbegayle was a no show for port appt on 6/6. Hard to understand the reason that she did not go to port placement appt. Offered to call her other daughters, she declined offer and will call them herself.  She will talk with daughters tonight and call the office back in the am with what day works best for port placement appt.  Sent a message to the transportation coordinator and he will give her a call to help with transportation.

## 2021-09-27 NOTE — Telephone Encounter (Signed)
Called and given below message. She will talk with her daughter tonight and call the office back tomorrow. She wants to make sure that it is okay with her daughter.

## 2021-09-27 NOTE — Telephone Encounter (Signed)
Called her back. She is still waiting on her daughter to tell her what day would work best for port placement. She will call the offfice back this afternoon.

## 2021-09-27 NOTE — Telephone Encounter (Signed)
Called her back she wants to cancel appts next week. Appts canceled. Her daughter can bring her for port placement on 6/29. Will arrange appt and call her back.  Called back. Given appt on 6/29 for port placement at [facility], arrive a 1200 for 2 pm appt. NPO after midnight, may take am meds with a sip of water, needs driver and 24 hour supervision post port placement. She verbalized understanding and the office will mail her a schedule.

## 2021-09-27 NOTE — Telephone Encounter (Signed)
We can get her started on chemo on either 6/30 or 7/3, but she needs labs done ahead of time Can she come here first morning of Thursday, labs and see me and then go for port and come back Friday for chemo?

## 2021-09-28 ENCOUNTER — Encounter: Payer: Self-pay | Admitting: Hematology and Oncology

## 2021-09-28 ENCOUNTER — Telehealth: Payer: Self-pay

## 2021-09-28 ENCOUNTER — Ambulatory Visit: Payer: Medicaid Other | Admitting: Hematology and Oncology

## 2021-09-28 ENCOUNTER — Ambulatory Visit: Payer: Medicaid Other

## 2021-09-28 ENCOUNTER — Other Ambulatory Visit: Payer: Medicaid Other

## 2021-09-28 NOTE — Telephone Encounter (Signed)
Called and left a message asking her to call the office back. Following up on yesterdays conversation  We can get her started on chemo on either 6/30 or 7/3, but she needs labs done ahead of time Can she come here first morning of Thursday, labs and see me and then go for port and come back Friday for chemo?

## 2021-10-02 NOTE — Telephone Encounter (Signed)
Called back again and given below message. She can come on 6/29 am prior to going to [facility] at 1200 for port placement. She is waiting on her daughter to tell her if 6/30 or 7/3 works best for chemo.  Okay to [facility] you for lab / MD appt on 6/29?

## 2021-10-02 NOTE — Telephone Encounter (Signed)
Called and told Debbie [name] to call the office back when her daughter tells her what day works best to schedule chemo. Then the office will schedule all of the appts.  Offered to help with transportation if needed. She verbalized understanding and will call the office back

## 2021-10-02 NOTE — Telephone Encounter (Signed)
Only if we can confirm her chemo appt. I do not want to overbook my schedule if chemo cannot be given on 6/30 or 7/3

## 2021-10-03 ENCOUNTER — Encounter: Payer: Self-pay | Admitting: Hematology and Oncology

## 2021-10-03 NOTE — Telephone Encounter (Signed)
She never called back. Just FYI

## 2021-10-05 ENCOUNTER — Ambulatory Visit: Payer: Medicaid Other

## 2021-10-05 ENCOUNTER — Ambulatory Visit: Payer: Medicaid Other | Admitting: Hematology and Oncology

## 2021-10-05 ENCOUNTER — Other Ambulatory Visit: Payer: Medicaid Other

## 2021-10-10 ENCOUNTER — Other Ambulatory Visit: Payer: Self-pay | Admitting: Radiology

## 2021-10-11 ENCOUNTER — Ambulatory Visit (HOSPITAL_COMMUNITY): Admission: RE | Admit: 2021-10-11 | Payer: Medicaid Other | Source: Ambulatory Visit

## 2021-10-12 ENCOUNTER — Encounter: Payer: Self-pay | Admitting: Hematology and Oncology

## 2021-10-12 ENCOUNTER — Telehealth: Payer: Self-pay

## 2021-10-12 NOTE — Telephone Encounter (Signed)
Called and left a message asking her to call the office back. She was a no show for port placement yesterday.

## 2021-10-15 ENCOUNTER — Ambulatory Visit: Payer: Medicaid Other | Admitting: *Deleted

## 2021-10-15 DIAGNOSIS — I2699 Other pulmonary embolism without acute cor pulmonale: Secondary | ICD-10-CM | POA: Diagnosis not present

## 2021-10-15 DIAGNOSIS — I4891 Unspecified atrial fibrillation: Secondary | ICD-10-CM | POA: Diagnosis not present

## 2021-10-15 DIAGNOSIS — Z5181 Encounter for therapeutic drug level monitoring: Secondary | ICD-10-CM | POA: Diagnosis not present

## 2021-10-15 LAB — POCT INR: INR: 1.8 — AB (ref 2.0–3.0)

## 2021-10-15 NOTE — Patient Instructions (Signed)
Pt did not increase dose to 3 tablets every Monday as instructed. Increase warfarin to 2 tablets daily except 3 tablets on Mondays Recheck in 3 wks

## 2021-11-05 ENCOUNTER — Ambulatory Visit (INDEPENDENT_AMBULATORY_CARE_PROVIDER_SITE_OTHER): Payer: Medicaid Other | Admitting: *Deleted

## 2021-11-05 DIAGNOSIS — Z5181 Encounter for therapeutic drug level monitoring: Secondary | ICD-10-CM

## 2021-11-05 DIAGNOSIS — I4891 Unspecified atrial fibrillation: Secondary | ICD-10-CM | POA: Diagnosis not present

## 2021-11-05 DIAGNOSIS — I2699 Other pulmonary embolism without acute cor pulmonale: Secondary | ICD-10-CM | POA: Diagnosis not present

## 2021-11-05 LAB — POCT INR: INR: 2.7 (ref 2.0–3.0)

## 2021-11-05 NOTE — Patient Instructions (Signed)
Continue warfarin 2 tablets daily except 3 tablets on Mondays Recheck in 4 wks

## 2021-11-08 ENCOUNTER — Other Ambulatory Visit: Payer: Self-pay | Admitting: Student

## 2021-11-09 ENCOUNTER — Ambulatory Visit (HOSPITAL_COMMUNITY)
Admission: RE | Admit: 2021-11-09 | Discharge: 2021-11-09 | Disposition: A | Discharge status: Home / Self Care | Payer: Medicaid Other | Source: Ambulatory Visit | Attending: Interventional Radiology | Admitting: Interventional Radiology

## 2021-11-09 DIAGNOSIS — N1330 Unspecified hydronephrosis: Secondary | ICD-10-CM | POA: Insufficient documentation

## 2021-11-09 HISTORY — PX: IR NEPHROSTOMY EXCHANGE RIGHT: IMG6070

## 2021-11-09 HISTORY — PX: IR NEPHROSTOMY EXCHANGE LEFT: IMG6069

## 2021-11-09 MED ORDER — SALINE FLUSH 0.9 % IV SOLN: 10.0000 mL | Freq: Every day | INTRAVENOUS | 0 refills | Status: DC

## 2021-11-09 MED ORDER — IOHEXOL 300 MG/ML  SOLN: 100.0000 mL | Freq: Once | INTRAMUSCULAR | Status: DC | PRN

## 2021-11-09 MED ORDER — LIDOCAINE HCL 1 % IJ SOLN
INTRAMUSCULAR | Status: AC
  Filled 2021-11-09: qty 20

## 2021-11-09 NOTE — Procedures (Signed)
Interventional Radiology Procedure Note  Procedure: Image guided bilateral PCN exchange, 3F pigtail drains.  Complications: None   Recommendations: - Routine drain care    - dc home  Signed,  Dulcy Fanny. Earleen [place], DO

## 2021-11-13 ENCOUNTER — Ambulatory Visit: Payer: Medicaid Other | Admitting: Cardiology

## 2021-11-13 ENCOUNTER — Encounter: Payer: Self-pay | Admitting: Cardiology

## 2021-11-13 ENCOUNTER — Other Ambulatory Visit (HOSPITAL_COMMUNITY): Payer: Self-pay | Admitting: Interventional Radiology

## 2021-11-13 VITALS — BP 120/80 | HR 68 | Ht 62.0 in | Wt 273.2 lb

## 2021-11-13 DIAGNOSIS — D6869 Other thrombophilia: Secondary | ICD-10-CM | POA: Diagnosis not present

## 2021-11-13 DIAGNOSIS — I4891 Unspecified atrial fibrillation: Secondary | ICD-10-CM | POA: Diagnosis not present

## 2021-11-13 DIAGNOSIS — R600 Localized edema: Secondary | ICD-10-CM

## 2021-11-13 DIAGNOSIS — I2699 Other pulmonary embolism without acute cor pulmonale: Secondary | ICD-10-CM | POA: Diagnosis not present

## 2021-11-13 DIAGNOSIS — N186 End stage renal disease: Secondary | ICD-10-CM

## 2021-11-13 MED ORDER — FUROSEMIDE 40 MG PO TABS: 40.0000 mg | ORAL_TABLET | Freq: Every day | ORAL | 11 refills | Status: DC
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
## 2021-11-13 NOTE — Patient Instructions (Addendum)

## 2021-12-03 ENCOUNTER — Ambulatory Visit (INDEPENDENT_AMBULATORY_CARE_PROVIDER_SITE_OTHER): Payer: Medicaid Other | Admitting: *Deleted

## 2021-12-03 DIAGNOSIS — I4891 Unspecified atrial fibrillation: Secondary | ICD-10-CM | POA: Diagnosis not present

## 2021-12-03 DIAGNOSIS — Z5181 Encounter for therapeutic drug level monitoring: Secondary | ICD-10-CM

## 2021-12-03 DIAGNOSIS — I2699 Other pulmonary embolism without acute cor pulmonale: Secondary | ICD-10-CM | POA: Diagnosis not present

## 2021-12-03 LAB — POCT INR: INR: 2.3 (ref 2.0–3.0)

## 2021-12-03 NOTE — Patient Instructions (Signed)
Continue warfarin 2 tablets daily except 3 tablets on Mondays Recheck in 4 wks

## 2021-12-31 ENCOUNTER — Ambulatory Visit: Payer: Medicaid Other | Attending: Cardiology | Admitting: *Deleted

## 2021-12-31 ENCOUNTER — Ambulatory Visit: Payer: Medicaid Other | Admitting: Physician Assistant

## 2021-12-31 DIAGNOSIS — Z5181 Encounter for therapeutic drug level monitoring: Secondary | ICD-10-CM | POA: Diagnosis not present

## 2021-12-31 DIAGNOSIS — I4891 Unspecified atrial fibrillation: Secondary | ICD-10-CM

## 2021-12-31 DIAGNOSIS — I2699 Other pulmonary embolism without acute cor pulmonale: Secondary | ICD-10-CM | POA: Diagnosis not present

## 2021-12-31 LAB — POCT INR: INR: 3.3 — AB (ref 2.0–3.0)

## 2021-12-31 NOTE — Patient Instructions (Signed)
Hold warfarin tonight then resume 2 tablets daily except 3 tablets on Mondays Recheck in 4 wks

## 2022-01-11 ENCOUNTER — Ambulatory Visit (HOSPITAL_COMMUNITY)
Admission: RE | Admit: 2022-01-11 | Discharge: 2022-01-11 | Disposition: A | Discharge status: Home / Self Care | Payer: Medicaid Other | Source: Ambulatory Visit | Attending: Interventional Radiology | Admitting: Interventional Radiology

## 2022-01-11 ENCOUNTER — Other Ambulatory Visit (HOSPITAL_COMMUNITY): Payer: Self-pay | Admitting: Interventional Radiology

## 2022-01-11 DIAGNOSIS — N1330 Unspecified hydronephrosis: Secondary | ICD-10-CM

## 2022-01-11 DIAGNOSIS — Z436 Encounter for attention to other artificial openings of urinary tract: Secondary | ICD-10-CM | POA: Diagnosis present

## 2022-01-11 DIAGNOSIS — N186 End stage renal disease: Secondary | ICD-10-CM | POA: Insufficient documentation

## 2022-01-11 HISTORY — PX: IR NEPHROSTOMY EXCHANGE LEFT: IMG6069

## 2022-01-11 HISTORY — PX: IR NEPHROSTOMY EXCHANGE RIGHT: IMG6070

## 2022-01-11 MED ORDER — LIDOCAINE HCL 1 % IJ SOLN
INTRAMUSCULAR | Status: DC
Start: 2022-01-11 — End: 2022-01-12
  Filled 2022-01-11: qty 20

## 2022-01-11 MED ORDER — IOHEXOL 300 MG/ML  SOLN
100.0000 mL | Freq: Once | INTRAMUSCULAR | Status: AC | PRN
  Administered 2022-01-11: 20 mL

## 2022-01-11 MED ORDER — LIDOCAINE HCL 1 % IJ SOLN
INTRAMUSCULAR | Status: DC | PRN
  Administered 2022-01-11: 20 mL

## 2022-01-11 NOTE — Procedures (Signed)
  Procedure:  Bilat perc neph exchange 66fPreprocedure diagnosis: The encounter diagnosis was ESRD ([place] stage [place] disease) [place].  Postprocedure diagnosis: same EBL:    minimal Complications:   none immediate  See full dictation in [facility]  DDillard CannonMD Main # [phone]  [phone] [phone]

## 2022-01-24 ENCOUNTER — Other Ambulatory Visit: Payer: Self-pay | Admitting: Cardiology

## 2022-01-24 DIAGNOSIS — I4891 Unspecified atrial fibrillation: Secondary | ICD-10-CM

## 2022-01-25 NOTE — Telephone Encounter (Signed)
Last INR 12/31/21 Last OV 11/13/21

## 2022-01-28 ENCOUNTER — Ambulatory Visit: Payer: Medicaid Other | Attending: Cardiology | Admitting: *Deleted

## 2022-01-28 DIAGNOSIS — I4891 Unspecified atrial fibrillation: Secondary | ICD-10-CM

## 2022-01-28 DIAGNOSIS — I2699 Other pulmonary embolism without acute cor pulmonale: Secondary | ICD-10-CM | POA: Diagnosis not present

## 2022-01-28 DIAGNOSIS — Z5181 Encounter for therapeutic drug level monitoring: Secondary | ICD-10-CM

## 2022-01-28 LAB — POCT INR: INR: 2.4 (ref 2.0–3.0)

## 2022-01-28 NOTE — Patient Instructions (Signed)
Continue 2 tablets daily except 3 tablets on Mondays Recheck in 4 wks 

## 2022-02-25 ENCOUNTER — Ambulatory Visit: Payer: Medicaid Other | Attending: Cardiology | Admitting: *Deleted

## 2022-02-25 ENCOUNTER — Other Ambulatory Visit: Payer: Self-pay | Admitting: Cardiology

## 2022-02-25 DIAGNOSIS — I4891 Unspecified atrial fibrillation: Secondary | ICD-10-CM

## 2022-02-25 DIAGNOSIS — Z5181 Encounter for therapeutic drug level monitoring: Secondary | ICD-10-CM | POA: Diagnosis not present

## 2022-02-25 DIAGNOSIS — I2699 Other pulmonary embolism without acute cor pulmonale: Secondary | ICD-10-CM

## 2022-02-25 LAB — POCT INR: INR: 2.9 (ref 2.0–3.0)

## 2022-02-25 NOTE — Patient Instructions (Signed)
Continue 2 tablets daily except 3 tablets on Mondays Recheck in 4 wks

## 2022-02-26 NOTE — Telephone Encounter (Signed)
Refill request for warfarin:  Last INR was 2.9 on 02/25/22 Next IBR due 03/25/22 LOV was 11/13/21  Zandra Abts MD  Refill approved.

## 2022-03-08 ENCOUNTER — Ambulatory Visit (HOSPITAL_COMMUNITY)
Admission: RE | Admit: 2022-03-08 | Discharge: 2022-03-08 | Disposition: A | Discharge status: Home / Self Care | Payer: Medicaid Other | Source: Ambulatory Visit | Attending: Interventional Radiology | Admitting: Interventional Radiology

## 2022-03-08 ENCOUNTER — Other Ambulatory Visit (HOSPITAL_COMMUNITY): Payer: Self-pay | Admitting: Interventional Radiology

## 2022-03-08 DIAGNOSIS — Z436 Encounter for attention to other artificial openings of urinary tract: Secondary | ICD-10-CM | POA: Insufficient documentation

## 2022-03-08 DIAGNOSIS — N1330 Unspecified hydronephrosis: Secondary | ICD-10-CM

## 2022-03-08 HISTORY — PX: IR NEPHROSTOMY EXCHANGE LEFT: IMG6069

## 2022-03-08 HISTORY — PX: IR NEPHROSTOMY EXCHANGE RIGHT: IMG6070

## 2022-03-08 MED ORDER — LIDOCAINE HCL 1 % IJ SOLN
INTRAMUSCULAR | Status: AC
  Administered 2022-03-08: 10 mL
  Filled 2022-03-08: qty 20

## 2022-03-08 MED ORDER — CHLORHEXIDINE GLUCONATE 4 % EX LIQD
CUTANEOUS | Status: AC
  Filled 2022-03-08: qty 15

## 2022-03-08 MED ORDER — IOHEXOL 300 MG/ML  SOLN
100.0000 mL | Freq: Once | INTRAMUSCULAR | Status: AC | PRN
  Administered 2022-03-08: 20 mL

## 2022-03-08 NOTE — Procedures (Signed)
Interventional Radiology Procedure:   Indications: Chronic nephrostomy tubes  Procedure: Bilateral nephrostomy tube exchanges  Findings: New 12 Fr tubes placed bilaterally.   Complications: No immediate complications noted.     EBL: Minimal  Plan: Routine exchange in 8 weeks   Jaxx Huish R. Anselm Pancoast, MD  Pager: [phone]

## 2022-03-25 ENCOUNTER — Ambulatory Visit: Payer: Medicaid Other | Attending: Cardiology | Admitting: *Deleted

## 2022-03-25 DIAGNOSIS — Z5181 Encounter for therapeutic drug level monitoring: Secondary | ICD-10-CM

## 2022-03-25 DIAGNOSIS — I4891 Unspecified atrial fibrillation: Secondary | ICD-10-CM | POA: Diagnosis not present

## 2022-03-25 DIAGNOSIS — I2699 Other pulmonary embolism without acute cor pulmonale: Secondary | ICD-10-CM | POA: Diagnosis not present

## 2022-03-25 LAB — POCT INR: INR: 2.2 (ref 2.0–3.0)

## 2022-03-25 NOTE — Patient Instructions (Signed)
Continue 2 tablets daily except 3 tablets on Mondays Recheck in 6 wks

## 2022-04-18 ENCOUNTER — Telehealth (HOSPITAL_COMMUNITY): Payer: Self-pay

## 2022-04-18 NOTE — Telephone Encounter (Signed)
Called pt to move appt up on 1/19. She will talk to her daughter and call me back about moving her appt up or to a different day. AW

## 2022-04-29 ENCOUNTER — Ambulatory Visit (HOSPITAL_COMMUNITY): Payer: Medicaid Other

## 2022-05-02 ENCOUNTER — Ambulatory Visit (HOSPITAL_COMMUNITY): Payer: Medicaid Other

## 2022-05-02 ENCOUNTER — Other Ambulatory Visit (HOSPITAL_COMMUNITY): Payer: Self-pay | Admitting: Interventional Radiology

## 2022-05-02 ENCOUNTER — Ambulatory Visit (HOSPITAL_COMMUNITY)
Admission: RE | Admit: 2022-05-02 | Discharge: 2022-05-02 | Disposition: A | Discharge status: Home / Self Care | Payer: Medicaid Other | Source: Ambulatory Visit | Attending: Interventional Radiology | Admitting: Interventional Radiology

## 2022-05-02 DIAGNOSIS — N1330 Unspecified hydronephrosis: Secondary | ICD-10-CM

## 2022-05-02 HISTORY — PX: IR NEPHROSTOMY EXCHANGE RIGHT: IMG6070

## 2022-05-02 HISTORY — PX: IR NEPHROSTOMY EXCHANGE LEFT: IMG6069

## 2022-05-02 MED ORDER — LIDOCAINE HCL 1 % IJ SOLN
INTRAMUSCULAR | Status: AC
  Administered 2022-05-02: 10 mL
  Filled 2022-05-02: qty 20

## 2022-05-02 MED ORDER — IOHEXOL 300 MG/ML  SOLN
50.0000 mL | Freq: Once | INTRAMUSCULAR | Status: AC | PRN
  Administered 2022-05-02: 15 mL

## 2022-05-02 NOTE — Procedures (Signed)
Interventional Radiology Procedure Note  Procedure: Image guided bilateral PCN exchange. 98F drains bilateral.   Findings: The right had become blocked.   Recommendations: - Routine drain care,   - would have the next exchange 2 weeks earlier based on the right being blocked    Signed,  Dulcy Fanny. Earleen Newport, DO

## 2022-05-03 ENCOUNTER — Other Ambulatory Visit (HOSPITAL_COMMUNITY): Payer: Medicaid Other

## 2022-05-06 ENCOUNTER — Ambulatory Visit: Payer: Medicaid Other | Attending: Cardiology | Admitting: *Deleted

## 2022-05-06 DIAGNOSIS — I4891 Unspecified atrial fibrillation: Secondary | ICD-10-CM

## 2022-05-06 DIAGNOSIS — I2699 Other pulmonary embolism without acute cor pulmonale: Secondary | ICD-10-CM

## 2022-05-06 DIAGNOSIS — Z5181 Encounter for therapeutic drug level monitoring: Secondary | ICD-10-CM | POA: Diagnosis not present

## 2022-05-06 LAB — POCT INR: INR: 3.8 — AB (ref 2.0–3.0)

## 2022-05-06 NOTE — Patient Instructions (Signed)
Hold warfarin today then resume 2 tablets daily except 3 tablets on Mondays Recheck in 4 wks

## 2022-05-16 ENCOUNTER — Ambulatory Visit: Payer: Medicaid Other | Attending: Cardiology | Admitting: Cardiology

## 2022-05-16 ENCOUNTER — Encounter: Payer: Self-pay | Admitting: Cardiology

## 2022-05-16 VITALS — BP 128/84 | HR 66 | Ht 62.0 in | Wt 249.8 lb

## 2022-05-16 DIAGNOSIS — Z86711 Personal history of pulmonary embolism: Secondary | ICD-10-CM | POA: Diagnosis not present

## 2022-05-16 DIAGNOSIS — R600 Localized edema: Secondary | ICD-10-CM

## 2022-05-16 DIAGNOSIS — I4891 Unspecified atrial fibrillation: Secondary | ICD-10-CM

## 2022-05-16 MED ORDER — FUROSEMIDE 40 MG PO TABS: 40.0000 mg | ORAL_TABLET | Freq: Every day | ORAL | 3 refills | Status: DC
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
## 2022-05-16 NOTE — Patient Instructions (Signed)
Medication Instructions:  Continue all current medications.  Labwork: none  Testing/Procedures: none  Follow-Up: 6 months   Any Other Special Instructions Will Be Listed Below (If Applicable).  If you need a refill on your cardiac medications before your next appointment, please call your pharmacy.  

## 2022-06-10 ENCOUNTER — Ambulatory Visit: Payer: Medicaid Other | Attending: Cardiology | Admitting: Pharmacist

## 2022-06-10 ENCOUNTER — Other Ambulatory Visit (HOSPITAL_COMMUNITY)
Admission: RE | Admit: 2022-06-10 | Discharge: 2022-06-10 | Disposition: A | Discharge status: Home / Self Care | Payer: Medicaid Other | Source: Ambulatory Visit | Attending: Cardiology | Admitting: Cardiology

## 2022-06-10 DIAGNOSIS — I4891 Unspecified atrial fibrillation: Secondary | ICD-10-CM

## 2022-06-10 DIAGNOSIS — I82401 Acute embolism and thrombosis of unspecified deep veins of right lower extremity: Secondary | ICD-10-CM

## 2022-06-10 DIAGNOSIS — I480 Paroxysmal atrial fibrillation: Secondary | ICD-10-CM

## 2022-06-10 DIAGNOSIS — I2699 Other pulmonary embolism without acute cor pulmonale: Secondary | ICD-10-CM | POA: Diagnosis not present

## 2022-06-10 DIAGNOSIS — Z86711 Personal history of pulmonary embolism: Secondary | ICD-10-CM | POA: Diagnosis present

## 2022-06-10 DIAGNOSIS — Z5181 Encounter for therapeutic drug level monitoring: Secondary | ICD-10-CM

## 2022-06-10 LAB — POCT INR: POC INR: 8

## 2022-06-10 LAB — PROTIME-INR
INR: 8 (ref 0.8–1.2)
Prothrombin Time: 66.5 seconds — ABNORMAL HIGH (ref 11.4–15.2)

## 2022-06-13 ENCOUNTER — Ambulatory Visit: Payer: Medicaid Other | Attending: Cardiology | Admitting: *Deleted

## 2022-06-13 DIAGNOSIS — I2699 Other pulmonary embolism without acute cor pulmonale: Secondary | ICD-10-CM

## 2022-06-13 DIAGNOSIS — Z5181 Encounter for therapeutic drug level monitoring: Secondary | ICD-10-CM

## 2022-06-13 DIAGNOSIS — I4891 Unspecified atrial fibrillation: Secondary | ICD-10-CM

## 2022-06-13 LAB — POCT INR: POC INR: 3

## 2022-06-13 NOTE — Patient Instructions (Signed)
Description   START taking warfarin 2 tablets daily. Recheck INR in 1 week.

## 2022-06-20 ENCOUNTER — Ambulatory Visit: Payer: Medicaid Other | Attending: Cardiology | Admitting: *Deleted

## 2022-06-20 ENCOUNTER — Other Ambulatory Visit (HOSPITAL_COMMUNITY): Payer: Self-pay | Admitting: Interventional Radiology

## 2022-06-20 ENCOUNTER — Ambulatory Visit (HOSPITAL_COMMUNITY)
Admission: RE | Admit: 2022-06-20 | Discharge: 2022-06-20 | Disposition: A | Discharge status: Home / Self Care | Payer: Medicaid Other | Source: Ambulatory Visit | Attending: Interventional Radiology | Admitting: Interventional Radiology

## 2022-06-20 DIAGNOSIS — I4891 Unspecified atrial fibrillation: Secondary | ICD-10-CM

## 2022-06-20 DIAGNOSIS — N1330 Unspecified hydronephrosis: Secondary | ICD-10-CM | POA: Insufficient documentation

## 2022-06-20 DIAGNOSIS — Z5181 Encounter for therapeutic drug level monitoring: Secondary | ICD-10-CM

## 2022-06-20 DIAGNOSIS — Z436 Encounter for attention to other artificial openings of urinary tract: Secondary | ICD-10-CM | POA: Diagnosis present

## 2022-06-20 DIAGNOSIS — I2699 Other pulmonary embolism without acute cor pulmonale: Secondary | ICD-10-CM

## 2022-06-20 HISTORY — PX: IR NEPHROSTOMY EXCHANGE LEFT: IMG6069

## 2022-06-20 HISTORY — PX: IR NEPHROSTOMY EXCHANGE RIGHT: IMG6070

## 2022-06-20 LAB — POCT INR: INR: 3.3 — AB (ref 2.0–3.0)

## 2022-06-20 MED ORDER — IOHEXOL 300 MG/ML  SOLN
50.0000 mL | Freq: Once | INTRAMUSCULAR | Status: AC | PRN
  Administered 2022-06-20: 10 mL

## 2022-06-20 MED ORDER — LIDOCAINE-EPINEPHRINE 1 %-1:100000 IJ SOLN
INTRAMUSCULAR | Status: AC
  Administered 2022-06-20: 8 mL
  Filled 2022-06-20: qty 1

## 2022-06-20 NOTE — Patient Instructions (Signed)
Decrease warfarin to 2 tablets daily except 1 tablet on Thursdays  Recheck INR in 2 weeks.

## 2022-06-20 NOTE — Procedures (Signed)
Interventional Radiology Procedure Note  Procedure:   Exchange of bilateral 65F PCN. Right was completely occluded. New 65F drains placed.   Complications: None Recommendations:  - To gravity - Routine care   Signed,  Dulcy Fanny. Earleen Newport, DO

## 2022-07-02 ENCOUNTER — Ambulatory Visit: Payer: Medicaid Other | Attending: Cardiology | Admitting: *Deleted

## 2022-07-02 DIAGNOSIS — I82401 Acute embolism and thrombosis of unspecified deep veins of right lower extremity: Secondary | ICD-10-CM | POA: Diagnosis not present

## 2022-07-02 DIAGNOSIS — Z5181 Encounter for therapeutic drug level monitoring: Secondary | ICD-10-CM | POA: Diagnosis not present

## 2022-07-02 LAB — POCT INR: INR: 3.9 — AB (ref 2.0–3.0)

## 2022-07-02 NOTE — Patient Instructions (Signed)
Hold warfarin tonight then continue 2 tablets daily except 1 tablet on Thursdays  Recheck INR in 2 weeks.

## 2022-07-04 ENCOUNTER — Other Ambulatory Visit (HOSPITAL_COMMUNITY): Payer: Medicaid Other

## 2022-07-16 ENCOUNTER — Ambulatory Visit: Payer: Medicaid Other | Attending: Cardiology | Admitting: *Deleted

## 2022-07-16 DIAGNOSIS — Z5181 Encounter for therapeutic drug level monitoring: Secondary | ICD-10-CM

## 2022-07-16 DIAGNOSIS — I480 Paroxysmal atrial fibrillation: Secondary | ICD-10-CM | POA: Diagnosis not present

## 2022-07-16 LAB — POCT INR: INR: 6.8 — AB (ref 2.0–3.0)

## 2022-07-16 NOTE — Patient Instructions (Signed)
Pt has been taking 2 tablets daily by mistake Hold warfarin x 3 days then decrease dose to 2 tablets daily except 1 tablet on Mondays and Thursdays  Recheck INR in 1 week Pt denies s/s of bleeding.  Bleeding and fall precautions discussed with pt and she verbalized understanding.

## 2022-07-21 ENCOUNTER — Encounter (HOSPITAL_COMMUNITY): Payer: Self-pay | Admitting: Emergency Medicine

## 2022-07-21 ENCOUNTER — Emergency Department (HOSPITAL_COMMUNITY)
Admission: EM | Admit: 2022-07-21 | Discharge: 2022-07-21 | Disposition: A | Discharge status: Home / Self Care | Payer: Medicaid Other | Attending: Emergency Medicine | Admitting: Emergency Medicine

## 2022-07-21 DIAGNOSIS — Z7901 Long term (current) use of anticoagulants: Secondary | ICD-10-CM | POA: Diagnosis not present

## 2022-07-21 DIAGNOSIS — Z936 Other artificial openings of urinary tract status: Secondary | ICD-10-CM | POA: Insufficient documentation

## 2022-07-21 DIAGNOSIS — T83022A Displacement of nephrostomy catheter, initial encounter: Secondary | ICD-10-CM | POA: Insufficient documentation

## 2022-07-21 DIAGNOSIS — Z8541 Personal history of malignant neoplasm of cervix uteri: Secondary | ICD-10-CM | POA: Insufficient documentation

## 2022-07-21 NOTE — ED Triage Notes (Signed)
Pt reports nephrostomy tube came out this morning. Pt has them changed every 2 months and due for change in a month.

## 2022-07-21 NOTE — ED Provider Notes (Signed)
[place] EMERGENCY DEPARTMENT AT [facility] Provider Note   CSN: [id number] Arrival date & time: 07/21/22  1110     History  Chief Complaint  Patient presents with   catheter issue    Debbie [name] is a 61 y.o. female.  She has PMH of invasive cervical cancer and has bilateral nephrostomy tubes chronically due to her chronic hydronephrosis, follows with [facility].  States her left nephrostomy tube came out completely this morning.  HPI     Home Medications Prior to Admission medications   Medication Sig Start Date End Date Taking? Authorizing Provider  acetaminophen (TYLENOL) 325 MG tablet Take 2 tablets (650 mg total) by mouth every 6 (six) hours as needed for mild pain or moderate pain. 04/29/21   Hongalgi, Maximino Greenland, MD  atenolol (TENORMIN) 50 MG tablet Take 50 mg by mouth daily.    [provider]  Cholecalciferol (VITAMIN D) 50 MCG (2000 UT) tablet Take 4,000 Units by mouth daily.    [provider]  folic acid (FOLVITE) 1 MG tablet Take 1 tablet (1 mg total) by mouth daily. Patient not taking: Reported on 03/04/2022 04/30/21   Elease Etienne, MD  furosemide (LASIX) 40 MG tablet Take 1 tablet (40 mg total) by mouth daily. 05/16/22 05/16/23  Antoine Poche, MD  hydrocortisone 2.5 % cream Apply 1 application topically daily as needed (itch/rash). 04/11/21   [provider]  oxybutynin (DITROPAN-XL) 10 MG 24 hr tablet Take 10 mg by mouth daily. 06/27/21   [provider]  PHENobarbital (LUMINAL) 97.2 MG tablet Take 97.2 mg by mouth 2 (two) times daily.    [provider]  Sodium Chloride Flush (SALINE FLUSH) 0.9 % SOLN Place 10 mLs into feeding tube daily. 11/09/21   Hoyt Koch, PA  warfarin (COUMADIN) 5 MG tablet TAKE 2-3 TABLETS BY MOUTH ONCE DAILY AS DIRECTED BY COUMADIN CLINIC 02/26/22   Antoine Poche, MD      Allergies    Patient has no known allergies.    Review of Systems   Review of  Systems  Physical Exam Updated Vital Signs BP 96/60 (BP Location: Left Arm)   Pulse 80   Temp 97.8 F (36.6 C) (Oral)   Resp 16   Ht 5\' 2"  (1.575 m)   Wt 113.4 kg   SpO2 100%   BMI 45.73 kg/m  Physical Exam Vitals and nursing note reviewed.  Constitutional:      General: She is not in acute distress.    Appearance: She is well-developed.  HENT:     Head: Normocephalic and atraumatic.  Eyes:     Conjunctiva/sclera: Conjunctivae normal.  Cardiovascular:     Rate and Rhythm: Normal rate and regular rhythm.     Heart sounds: No murmur heard. Pulmonary:     Effort: Pulmonary effort is normal. No respiratory distress.     Breath sounds: Normal breath sounds.  Abdominal:     Palpations: Abdomen is soft.     Tenderness: There is no abdominal tenderness.     Comments: Right nephrostomy tube in place, left nephrostomy tube is a dislodged no redness drainage or swelling at the sites  Musculoskeletal:        General: No swelling.     Cervical back: Neck supple.  Skin:    General: Skin is warm and dry.     Capillary Refill: Capillary refill takes less than 2 seconds.  Neurological:     Mental Status:  She is alert.  Psychiatric:        Mood and Affect: Mood normal.     ED Results / Procedures / Treatments   Labs (all labs ordered are listed, but only abnormal results are displayed) Labs Reviewed - No data to display  EKG None  Radiology No results found.  Procedures Procedures    Medications Ordered in ED Medications - No data to display  ED Course/ Medical Decision Making/ A&P Clinical Course as of 07/21/22 1934  Sun Jul 21, 2022  1444 Nephrostomy tube came out this morning, discussed with Dr. Pete Glatter of urology, discussed that if he set up with interventional radiology.  Patient has no abdominal pain, flank pain nausea or other symptoms. [CB]    Clinical Course User Index [CB] Ma Rings, PA-C                             Medical Decision  Making DDx: Nephrostomy tube dislodged, UTI, other ED course: Patient presents after having her nephrostomy tube come out this morning.  It is completely dislodged.  I initially spoke with urology who states this will be replaced by interventional radiology.  I consulted Dr. Corky Sing of interventional radiology.  He states that patient has had the tube for a year and the tract should be patent for 24 hours so they will call her tomorrow for an appointment to have the procedure done tomorrow either at [facility] or [facility] wherever they have an opening.  Patient and family member were agreeable with this.           Final Clinical Impression(s) / ED Diagnoses Final diagnoses:  Nephrostomy tube displaced    Rx / DC Orders ED Discharge Orders     None         Josem Kaufmann 07/21/22 1934    Pricilla Loveless, MD 07/22/22 [phone]

## 2022-07-21 NOTE — Discharge Instructions (Signed)
Pleasure caring for you today.  I spoke with the on-call interventional radiologist, Dr. Corky Sing.  He is going to have them call you in the morning to schedule your procedure tomorrow at either [facility] or [facility], wherever they have an opening.  If you have any new or worsening symptoms or any changes please come back to the ER, otherwise follow-up to have your nephrostomy tube replaced tomorrow

## 2022-07-22 ENCOUNTER — Telehealth (HOSPITAL_COMMUNITY): Payer: Self-pay

## 2022-07-22 ENCOUNTER — Other Ambulatory Visit (HOSPITAL_COMMUNITY): Payer: Self-pay | Admitting: Interventional Radiology

## 2022-07-22 ENCOUNTER — Ambulatory Visit: Admission: RE | Admit: 2022-07-22 | Payer: Medicaid Other | Source: Ambulatory Visit | Admitting: Radiology

## 2022-07-22 DIAGNOSIS — N1330 Unspecified hydronephrosis: Secondary | ICD-10-CM

## 2022-07-22 NOTE — Progress Notes (Signed)
Patient for IR LT Neph Tube Exchange on Tues 07/23/2022, I called and spoke with the patient on the phone and gave pre-procedure instructions. Pt was made aware to be here at 12:30p, NPO after MN prior to procedure as well as driver post procedure/recovery/discharge. Pt stated understanding.  Called 07/22/2022

## 2022-07-22 NOTE — Telephone Encounter (Signed)
Spoke with NP Victorino Dike Omohundro): We need to get her scheduled for left neph tube replacement. She had an INR of 6.8 in the ED so that needs to be addressed. If you schedule her has a new placement ( NPO etc.) that would probably be best. We need to get her in asap. Thank you.

## 2022-07-23 ENCOUNTER — Other Ambulatory Visit: Payer: Self-pay | Admitting: Student

## 2022-07-23 ENCOUNTER — Other Ambulatory Visit (HOSPITAL_COMMUNITY): Payer: Self-pay | Admitting: Interventional Radiology

## 2022-07-23 ENCOUNTER — Encounter: Payer: Self-pay | Admitting: Radiology

## 2022-07-23 ENCOUNTER — Ambulatory Visit
Admission: RE | Admit: 2022-07-23 | Discharge: 2022-07-23 | Disposition: A | Discharge status: Home / Self Care | Payer: Medicaid Other | Source: Ambulatory Visit | Attending: Interventional Radiology | Admitting: Interventional Radiology

## 2022-07-23 ENCOUNTER — Other Ambulatory Visit: Payer: Self-pay | Admitting: Interventional Radiology

## 2022-07-23 DIAGNOSIS — Z8541 Personal history of malignant neoplasm of cervix uteri: Secondary | ICD-10-CM | POA: Diagnosis not present

## 2022-07-23 DIAGNOSIS — N1330 Unspecified hydronephrosis: Secondary | ICD-10-CM

## 2022-07-23 DIAGNOSIS — Z436 Encounter for attention to other artificial openings of urinary tract: Secondary | ICD-10-CM | POA: Diagnosis present

## 2022-07-23 DIAGNOSIS — Z86718 Personal history of other venous thrombosis and embolism: Secondary | ICD-10-CM | POA: Insufficient documentation

## 2022-07-23 DIAGNOSIS — I10 Essential (primary) hypertension: Secondary | ICD-10-CM | POA: Insufficient documentation

## 2022-07-23 DIAGNOSIS — R300 Dysuria: Secondary | ICD-10-CM

## 2022-07-23 HISTORY — PX: IR NEPHROSTOMY EXCHANGE LEFT: IMG6069

## 2022-07-23 HISTORY — PX: IR NEPHROSTOMY EXCHANGE RIGHT: IMG6070

## 2022-07-23 LAB — BASIC METABOLIC PANEL
Anion gap: 6 (ref 5–15)
BUN: 14 mg/dL (ref 8–23)
CO2: 25 mmol/L (ref 22–32)
Calcium: 8.6 mg/dL — ABNORMAL LOW (ref 8.9–10.3)
Chloride: 108 mmol/L (ref 98–111)
Creatinine, Ser: 1.23 mg/dL — ABNORMAL HIGH (ref 0.44–1.00)
GFR, Estimated: 49 mL/min — ABNORMAL LOW (ref >60)
Glucose, Bld: 98 mg/dL (ref 70–99)
Potassium: 3.7 mmol/L (ref 3.5–5.1)
Sodium: 139 mmol/L (ref 135–145)

## 2022-07-23 LAB — CBC
HCT: 36.1 % (ref 36.0–46.0)
Hemoglobin: 11.6 g/dL — ABNORMAL LOW (ref 12.0–15.0)
MCH: 30.4 pg (ref 26.0–34.0)
MCHC: 32.1 g/dL (ref 30.0–36.0)
MCV: 94.5 fL (ref 80.0–100.0)
Platelets: 263 10*3/uL (ref 150–400)
RBC: 3.82 MIL/uL — ABNORMAL LOW (ref 3.87–5.11)
RDW: 14.1 % (ref 11.5–15.5)
WBC: 7.1 10*3/uL (ref 4.0–10.5)
nRBC: 0 % (ref 0.0–0.2)

## 2022-07-23 LAB — PROTIME-INR
INR: 2.1 — ABNORMAL HIGH (ref 0.8–1.2)
Prothrombin Time: 23.3 seconds — ABNORMAL HIGH (ref 11.4–15.2)

## 2022-07-23 MED ORDER — SODIUM CHLORIDE 0.9 % IV SOLN: INTRAVENOUS | Status: DC

## 2022-07-23 MED ORDER — LIDOCAINE HCL 1 % IJ SOLN
INTRAMUSCULAR | Status: AC
  Filled 2022-07-23: qty 20

## 2022-07-23 MED ORDER — LIDOCAINE HCL 1 % IJ SOLN: 10.0000 mL | Freq: Once | INTRAMUSCULAR | Status: DC

## 2022-07-23 MED ORDER — FENTANYL CITRATE (PF) 100 MCG/2ML IJ SOLN
INTRAMUSCULAR | Status: AC | PRN
  Administered 2022-07-23: 50 ug via INTRAVENOUS

## 2022-07-23 MED ORDER — MIDAZOLAM HCL 2 MG/2ML IJ SOLN
INTRAMUSCULAR | Status: AC | PRN
  Administered 2022-07-23: 1 mg via INTRAVENOUS

## 2022-07-23 MED ORDER — SODIUM CHLORIDE 0.9 % IV SOLN
2.0000 g | INTRAVENOUS | Status: DC
  Filled 2022-07-23: qty 20

## 2022-07-23 MED ORDER — FENTANYL CITRATE (PF) 100 MCG/2ML IJ SOLN
INTRAMUSCULAR | Status: AC
  Filled 2022-07-23: qty 2

## 2022-07-23 MED ORDER — IOHEXOL 300 MG/ML  SOLN
50.0000 mL | Freq: Once | INTRAMUSCULAR | Status: AC | PRN
  Administered 2022-07-23: 15 mL

## 2022-07-23 MED ORDER — MIDAZOLAM HCL 2 MG/2ML IJ SOLN
INTRAMUSCULAR | Status: AC
  Filled 2022-07-23: qty 2

## 2022-07-23 MED ORDER — SODIUM CHLORIDE 0.9 % IV SOLN
INTRAVENOUS | Status: AC | PRN
  Administered 2022-07-23: 2 g via INTRAVENOUS

## 2022-07-23 NOTE — Procedures (Signed)
Interventional Radiology Procedure Note  Procedure: bilateral pcn exchgs    Complications: None  Estimated Blood Loss:  0  Findings: 12 fr pcns    M. Ruel Favors, MD
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
## 2022-07-24 ENCOUNTER — Ambulatory Visit: Payer: Medicaid Other | Attending: Cardiology | Admitting: *Deleted

## 2022-07-24 DIAGNOSIS — Z5181 Encounter for therapeutic drug level monitoring: Secondary | ICD-10-CM

## 2022-07-24 DIAGNOSIS — I480 Paroxysmal atrial fibrillation: Secondary | ICD-10-CM | POA: Diagnosis not present

## 2022-07-24 LAB — POCT INR: INR: 3.1 — AB (ref 2.0–3.0)

## 2022-07-24 NOTE — Patient Instructions (Signed)
Decrease dose to 2 tablets daily except 1 tablet on Mondays, Wednesdays and Fridays  Recheck INR in 2 week

## 2022-07-25 ENCOUNTER — Ambulatory Visit (HOSPITAL_COMMUNITY): Payer: Medicaid Other

## 2022-08-05 ENCOUNTER — Ambulatory Visit: Payer: Medicaid Other | Attending: Cardiology | Admitting: *Deleted

## 2022-08-05 DIAGNOSIS — I480 Paroxysmal atrial fibrillation: Secondary | ICD-10-CM | POA: Diagnosis not present

## 2022-08-05 DIAGNOSIS — Z5181 Encounter for therapeutic drug level monitoring: Secondary | ICD-10-CM | POA: Diagnosis not present

## 2022-08-05 LAB — POCT INR: INR: 6.1 — AB (ref 2.0–3.0)

## 2022-08-05 NOTE — Patient Instructions (Addendum)
Hold warfarin x 3 days the decrease dose to 1 tablet daily except 2 tablets on Sundays and Thursdays  Recheck INR in 1 week Bleeding and fall precautions discussed with pt and she verbalized understanding.

## 2022-08-13 ENCOUNTER — Ambulatory Visit: Payer: Medicaid Other | Attending: Cardiology | Admitting: *Deleted

## 2022-08-13 DIAGNOSIS — Z5181 Encounter for therapeutic drug level monitoring: Secondary | ICD-10-CM | POA: Diagnosis not present

## 2022-08-13 DIAGNOSIS — I480 Paroxysmal atrial fibrillation: Secondary | ICD-10-CM | POA: Diagnosis not present

## 2022-08-13 LAB — POCT INR: INR: 2.7 (ref 2.0–3.0)

## 2022-08-13 NOTE — Patient Instructions (Signed)
Continue warfarin 1 tablet daily except 2 tablets on Sundays and Thursdays  Recheck INR in 3 week

## 2022-08-20 ENCOUNTER — Other Ambulatory Visit (HOSPITAL_COMMUNITY): Payer: Medicaid Other

## 2022-09-03 ENCOUNTER — Ambulatory Visit: Payer: Medicaid Other | Attending: Cardiology

## 2022-11-21 ENCOUNTER — Ambulatory Visit: Payer: Medicaid Other | Admitting: Cardiology
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
## 2022-11-27 ENCOUNTER — Ambulatory Visit (HOSPITAL_COMMUNITY): Admission: RE | Admit: 2022-11-27 | Payer: Medicaid Other | Source: Ambulatory Visit

## 2022-12-15 DEATH — deceased

## 2023-02-02 IMAGING — CT NM PET TUM IMG RESTAG (PS) SKULL BASE T - THIGH
7 series · 25 of 25 positions shown · non-contrast
Comparison: PET-CT 06/05/2021. Abdominal CT 05/19/2021 and
abdominopelvic CT 04/18/2021.

CLINICAL DATA: Subsequent treatment strategy for cervical cancer
post radiation therapy.

EXAM:
NUCLEAR MEDICINE PET SKULL BASE TO THIGH
TECHNIQUE: 13.14 mCi F-18 FDG was injected intravenously. Full-ring PET imaging
was performed from the skull base to thigh after the radiotracer. CT
data was obtained and used for attenuation correction and anatomic
localization.
Fasting blood glucose: 91 mg/dl

[Series 3: pet sk_thigh ac · axial · 5.0mm · 4.07mm/px · z∈[-951,-59]mm · 5 of 224 slices shown]
[im 1/224]
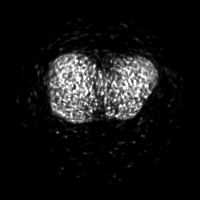
[im 56/224]
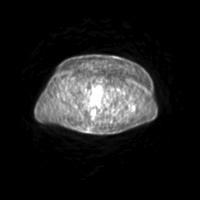
[im 112/224]
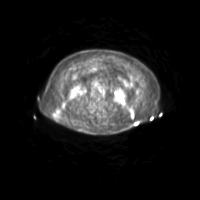
[im 168/224]
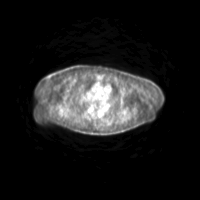
[im 224/224]
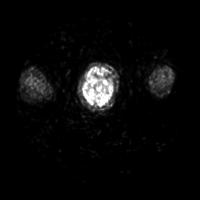

[Series 4: ct sk_thigh 5.0 br38 · axial · 5.0mm · 0.98mm/px · z∈[-951,-59]mm · 6 of 224 slices shown]
[im 1/224]
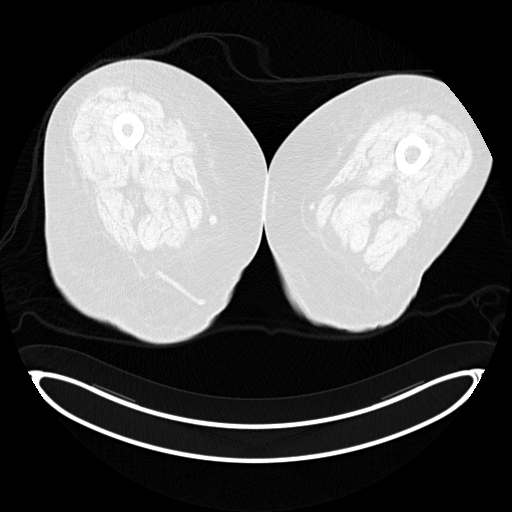
[im 45/224]
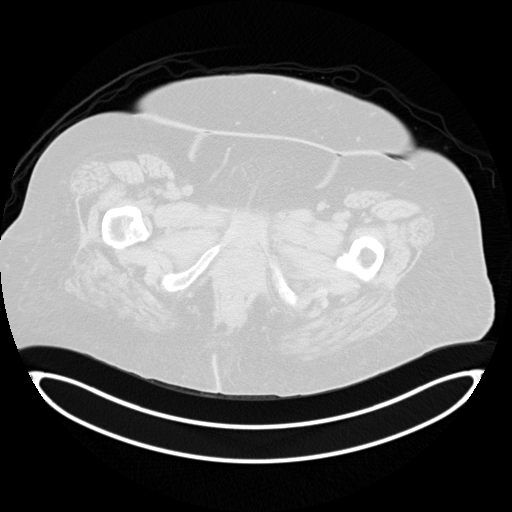
[im 90/224]
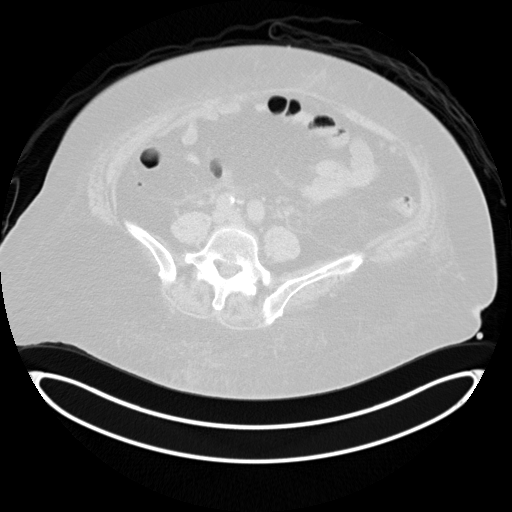
[im 134/224]
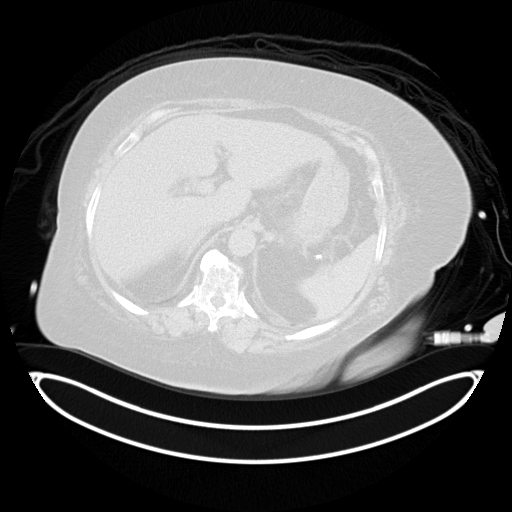
[im 179/224]
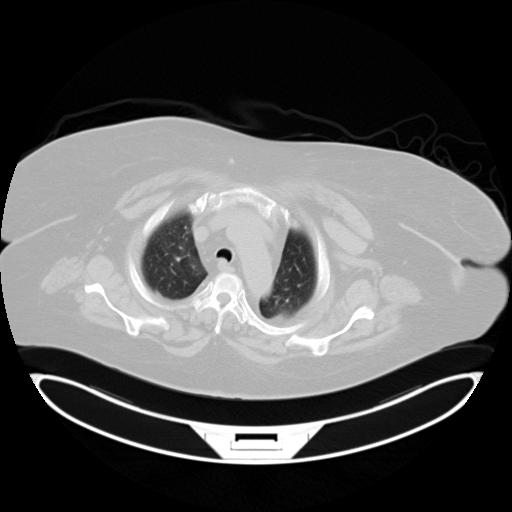
[im 224/224  brain]
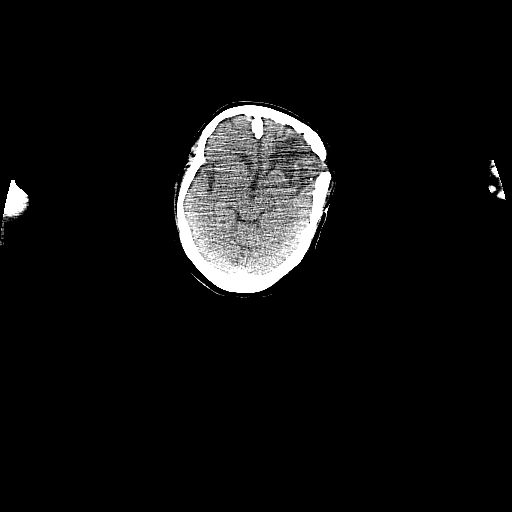

[Series 5: pet sk_thigh nac · axial · 5.0mm · 4.07mm/px · z∈[-951,-59]mm · 6 of 224 slices shown]
[im 1/224]
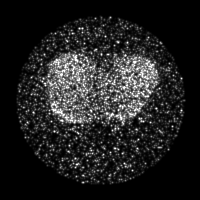
[im 45/224]
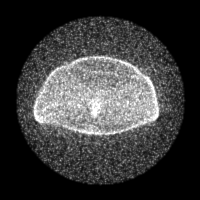
[im 90/224]
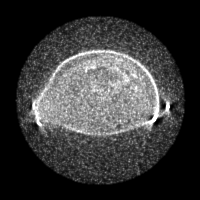
[im 134/224]
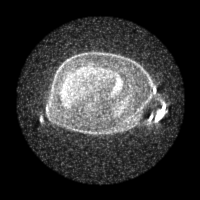
[im 179/224]
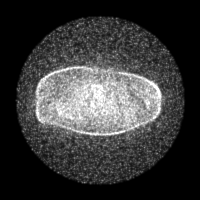
[im 224/224]
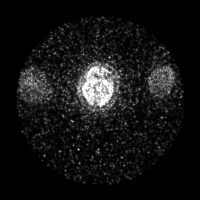

[Series 7: ct 5.0 bl57 lung_bone · axial · 5.0mm · 0.58mm/px · 1 of 57 slices shown]
[im 1/57  brain]
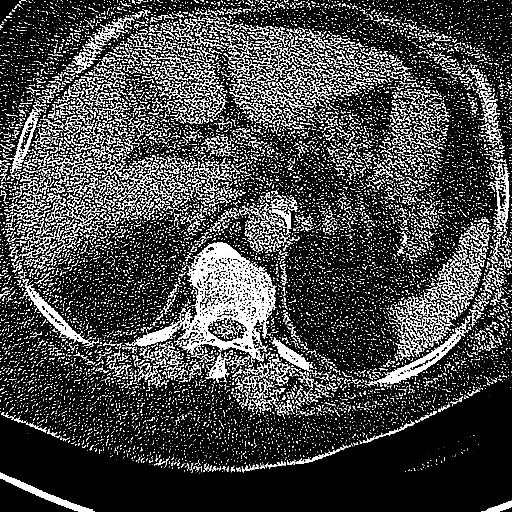

[Series 604: fused tra · 5 of 205 slices shown]
[im 1/205]
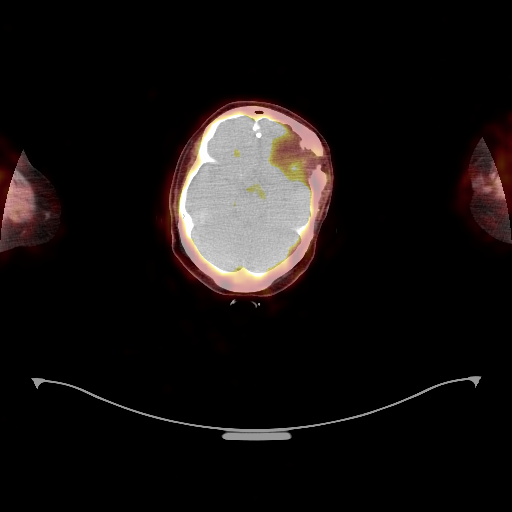
[im 52/205]
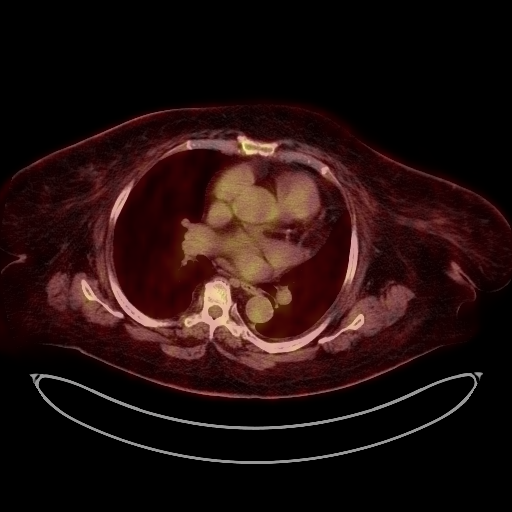
[im 103/205]
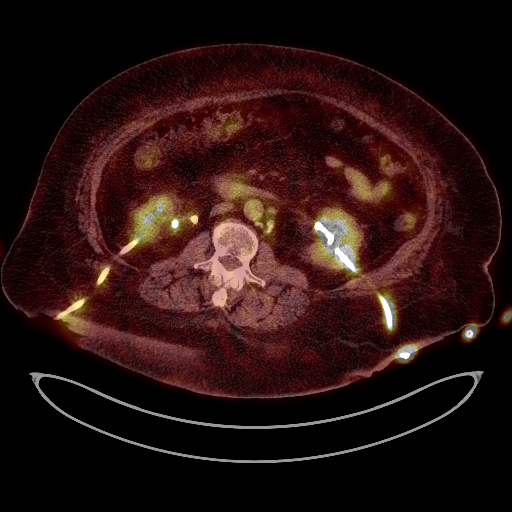
[im 154/205]
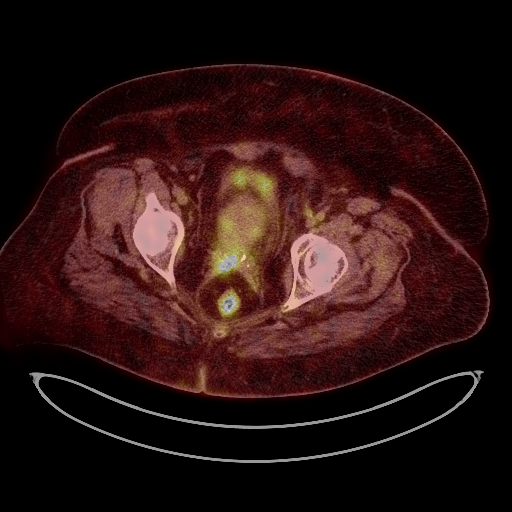
[im 205/205]
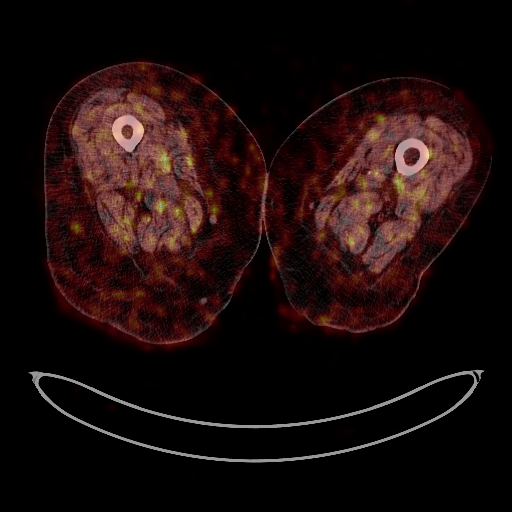

[Series 605: fused cor · 1 of 47 slices shown]
[im 1/47]
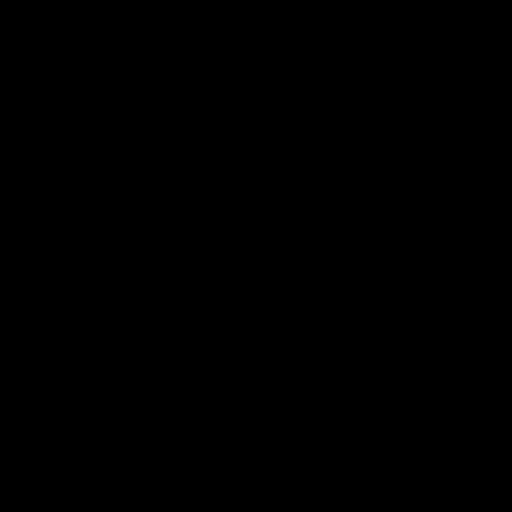

[Series 606: mip pet · coronal · 1.85mm/px · 1 of 32 slices shown]
[im 1/32]
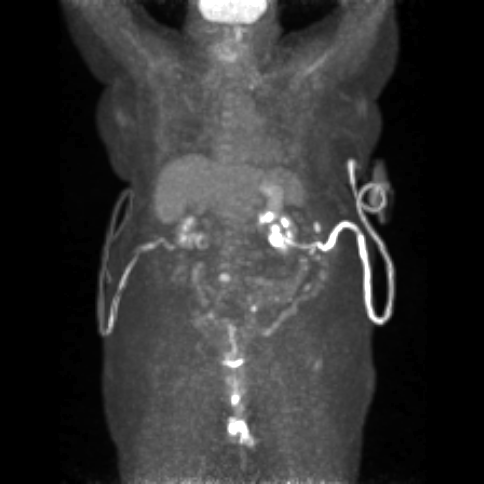

[25 of 25 positions shown; findings below may reference images not displayed]

FINDINGS: Mediastinal blood pool activity: SUV max

NECK:

No hypermetabolic cervical lymph nodes are identified.Nonspecific
asymmetric increased metabolic activity in the left nasopharynx and
right piriform sinus without clear corresponding CT abnormality.

Incidental CT findings: Previous left frontotemporal craniotomy.
Right ocular prosthesis and bilateral carotid atherosclerosis.

CHEST:

Similar hypermetabolic node at the left thoracic inlet, measuring 11
mm on image 40/4 (SUV max 6.0, previously 5.7). No other
hypermetabolic mediastinal, hilar or axillary lymph nodes. No
hypermetabolic pulmonary activity or suspicious nodularity.

Incidental CT findings: Mild atherosclerosis of the aorta, great
vessels and coronary arteries.

ABDOMEN/PELVIS:

The previously demonstrated extensive hypermetabolic activity
associated with the cervix and uterus has significantly decreased in
extent as well as intensity, although the latter is difficult to
evaluate due to proximity with the bladder. Currently, SUV max
(previously 21.3). Previously demonstrated hypermetabolic
retroperitoneal lymphadenopathy has also significantly improved. The
most hypermetabolic remaining lymph node is a left common iliac node
with an SUV max of 5.0. Multiple hypermetabolic peritoneal implants
are again noted which are similar to slightly increased in
hypermetabolic activity compared with the prior examination. For
example, a midline omental nodule measuring 1.4 cm on image 130/4
has an SUV max of 10.3 (previously 9.3). Minimal ascites. There is
no hypermetabolic activity within the liver or adrenal glands.

Incidental CT findings: Hepatic steatosis. Bilateral percutaneous
nephrotomies remain in place. Aortic and branch vessel
atherosclerosis. Perineal activity attributed to urine contamination
and probable underlying pelvic floor laxity.

SKELETON:

Mildly heterogeneous metabolic activity within the spine is similar
to the previous study. No definite osseous metastases identified.

Incidental CT findings: Thoracolumbar scoliosis with multilevel
spondylosis. Reactive sclerosis around the sacroiliac joints and
symphysis pubis.
IMPRESSION: 1. Overall response to therapy with significant improvement in
previously demonstrated hypermetabolic activity associated with the
uterus and cervix as well as multiple retroperitoneal lymph nodes.
2. Persistent hypermetabolic peritoneal metastatic disease without
substantial improvement. If anything, this may be slightly worse.
3. Similar hypermetabolic lymph node at the left thoracic inlet. No
other evidence of thoracic metastatic disease.
4. Indeterminate asymmetric activity in the nasopharynx and
piriformis sinuses, potentially inflammatory.
